# Patient Record
Sex: Male | Born: 1970 | Race: White | Hispanic: No | Marital: Married | Smoking: Former smoker
Health system: Southern US, Community
[De-identification: ages and names within clinical notes are randomized; demographics above are authoritative.]

## PROBLEM LIST (undated history)

## (undated) DIAGNOSIS — J302 Other seasonal allergic rhinitis: Secondary | ICD-10-CM

## (undated) DIAGNOSIS — K5900 Constipation, unspecified: Secondary | ICD-10-CM

## (undated) DIAGNOSIS — G8929 Other chronic pain: Secondary | ICD-10-CM

## (undated) DIAGNOSIS — R0600 Dyspnea, unspecified: Secondary | ICD-10-CM

## (undated) DIAGNOSIS — J189 Pneumonia, unspecified organism: Secondary | ICD-10-CM

## (undated) DIAGNOSIS — M549 Dorsalgia, unspecified: Secondary | ICD-10-CM

## (undated) DIAGNOSIS — T8859XA Other complications of anesthesia, initial encounter: Secondary | ICD-10-CM

## (undated) DIAGNOSIS — F909 Attention-deficit hyperactivity disorder, unspecified type: Secondary | ICD-10-CM

## (undated) HISTORY — DX: Dorsalgia, unspecified: M54.9

## (undated) HISTORY — DX: Constipation, unspecified: K59.00

## (undated) HISTORY — DX: Other seasonal allergic rhinitis: J30.2

## (undated) HISTORY — DX: Other chronic pain: G89.29

## (undated) HISTORY — PX: NECK SURGERY: SHX720

---

## 2000-02-13 ENCOUNTER — Encounter
Admission: RE | Admit: 2000-02-13 | Discharge: 2000-02-21 | Payer: Self-pay | Admitting: Physical Medicine and Rehabilitation

## 2002-10-03 ENCOUNTER — Encounter: Payer: Self-pay | Admitting: *Deleted

## 2002-10-03 ENCOUNTER — Emergency Department (HOSPITAL_COMMUNITY): Admission: EM | Admit: 2002-10-03 | Discharge: 2002-10-03 | Payer: Self-pay | Admitting: *Deleted

## 2004-11-18 ENCOUNTER — Emergency Department (HOSPITAL_COMMUNITY): Admission: EM | Admit: 2004-11-18 | Discharge: 2004-11-18 | Payer: Self-pay | Admitting: Emergency Medicine

## 2004-11-21 ENCOUNTER — Emergency Department (HOSPITAL_COMMUNITY): Admission: EM | Admit: 2004-11-21 | Discharge: 2004-11-22 | Payer: Self-pay | Admitting: Emergency Medicine

## 2005-01-22 ENCOUNTER — Emergency Department (HOSPITAL_COMMUNITY): Admission: EM | Admit: 2005-01-22 | Discharge: 2005-01-22 | Payer: Self-pay | Admitting: Emergency Medicine

## 2006-09-01 ENCOUNTER — Ambulatory Visit (HOSPITAL_COMMUNITY): Admission: RE | Admit: 2006-09-01 | Discharge: 2006-09-01 | Payer: Self-pay | Admitting: Family Medicine

## 2006-09-18 ENCOUNTER — Ambulatory Visit (HOSPITAL_COMMUNITY): Admission: RE | Admit: 2006-09-18 | Discharge: 2006-09-18 | Payer: Self-pay | Admitting: Family Medicine

## 2008-03-26 ENCOUNTER — Emergency Department (HOSPITAL_COMMUNITY): Admission: EM | Admit: 2008-03-26 | Discharge: 2008-03-26 | Payer: Self-pay | Admitting: Emergency Medicine

## 2011-09-10 ENCOUNTER — Ambulatory Visit: Payer: Self-pay | Admitting: Urgent Care

## 2011-10-01 ENCOUNTER — Encounter: Payer: Self-pay | Admitting: Urgent Care

## 2011-10-01 ENCOUNTER — Other Ambulatory Visit: Payer: Self-pay

## 2011-10-01 ENCOUNTER — Ambulatory Visit (INDEPENDENT_AMBULATORY_CARE_PROVIDER_SITE_OTHER): Payer: 59 | Admitting: Urgent Care

## 2011-10-01 VITALS — BP 130/79 | HR 83 | Temp 98.5°F | Ht 72.0 in | Wt 178.6 lb

## 2011-10-01 DIAGNOSIS — K5900 Constipation, unspecified: Secondary | ICD-10-CM

## 2011-10-01 DIAGNOSIS — Z800 Family history of malignant neoplasm of digestive organs: Secondary | ICD-10-CM

## 2011-10-01 DIAGNOSIS — R1012 Left upper quadrant pain: Secondary | ICD-10-CM | POA: Insufficient documentation

## 2011-10-01 NOTE — Progress Notes (Signed)
Primary Care Physician:  MCINNIS,ANGUS G, MD Primary Gastroenterologist:  Dr. Rourk  Chief Complaint  Patient presents with  . Constipation  . Abdominal Pain    LUQ    HPI:  [name] Potts is a 40 y.o. male here as a new patient for evaluation of chronic constipation.  He also wants to set up colonoscopy as his mother had hx colon ca dx at age 51.  He has taken OTC correctol but felt this was too rough.  Generally, he has a BM q3-4 days.  He has started fiber supplement chewable equate tabs.  They seem to help regulate his bowels.  C/o LUQ pain that was transient 1 month ago.  He currently has no abdominal pain.  The cramp-like pain was under his left rib.  It was not associated with eating, N/V/D, fever or worsened w/ movement.   He is on prn vicodin for chronic back pain.  Denies any upper GI symptoms including heartburn, indigestion, nausea, vomiting, dysphagia, odynophagia or anorexia.   Denies rectal bleeding, melena or weight loss.  Past Medical History  Diagnosis Date  . Constipation   . Chronic back pain   . Seasonal allergies     No past surgical history on file.  Current Outpatient Prescriptions  Medication Sig Dispense Refill  . fexofenadine (ALLEGRA) 180 MG tablet Take 180 mg by mouth daily.      . HYDROcodone-acetaminophen (VICODIN) 5-500 MG per tablet Take 1 tablet by mouth as needed.       . zolpidem (AMBIEN) 10 MG tablet Take 10 mg by mouth as needed.         Allergies as of 10/01/2011  . (No Known Allergies)    Family History  Problem Relation Age of Onset  . Colon cancer Mother 51    passed at age 55    History   Social History  . Marital Status: Married    Spouse Name: N/A    Number of Children: 1  . Years of Education: N/A   Occupational History  . Aircraft Tech    Social History Main Topics  . Smoking status: Former Smoker -- 1.0 packs/day    Types: Cigarettes  . Smokeless tobacco: Not on file   Comment: using a electric cigarette  . Alcohol  Use: No  . Drug Use: No  . Sexually Active: Not on file  Review of Systems: Gen: Denies any fever, chills, sweats, anorexia, fatigue, weakness, malaise, weight loss, and sleep disorder CV: Denies chest pain, angina, palpitations, syncope, orthopnea, PND, peripheral edema, and claudication. Resp: Denies dyspnea at rest, dyspnea with exercise, cough, sputum, wheezing, coughing up blood, and pleurisy. GI: Denies vomiting blood, jaundice, and fecal incontinence.  GU : Denies urinary burning, blood in urine, urinary frequency, urinary hesitancy, nocturnal urination, and urinary incontinence. MS: See HPI.  Denies muscle weakness, cramps, atrophy.  Derm: Denies rash, itching, dry skin, hives, moles, warts, or unhealing ulcers.  Psych: Denies depression, memory loss, suicidal ideation, hallucinations, paranoia, and confusion. Heme: Denies bruising, bleeding, and enlarged lymph nodes. Neuro:  Denies any headaches, dizziness, paresthesias. Endo:  Denies any problems with DM, thyroid, adrenal function.  Physical Exam: BP 130/79  Pulse 83  Temp 98.5 F (36.9 C) (Temporal)  Ht 6' (1.829 m)  Wt 178 lb 9.6 oz (81.012 kg)  BMI 24.22 kg/m2 General:   Alert,  Well-developed, well-nourished, pleasant and cooperative in NAD. Head:  Normocephalic and atraumatic. Eyes:  Sclera clear, no icterus.   Conjunctiva pink.   Ears:  Normal auditory acuity. Nose:  No deformity, discharge, or lesions. Mouth:  No deformity or lesions,oropharynx pink & moist. Neck:  Supple; no masses or thyromegaly. Lungs:  Clear throughout to auscultation.   No wheezes, crackles, or rhonchi. No acute distress. Heart:  Regular rate and rhythm; no murmurs, clicks, rubs,  or gallops. Abdomen:  Normal bowel sounds.  No bruits.  Soft, non-tender and non-distended without masses, hepatosplenomegaly or hernias noted.  No guarding or rebound tenderness.   Rectal:  Deferred. Msk:  Symmetrical without gross deformities. Normal  posture. Pulses:  Normal pulses noted. Extremities:  No clubbing or edema. Neurologic:  Alert and  oriented x4;  grossly normal neurologically. Skin:  Intact without significant lesions or rashes. Lymph Nodes:  No significant cervical adenopathy. Psych:  Alert and cooperative. Normal mood and affect.     

## 2011-10-01 NOTE — Assessment & Plan Note (Signed)
High-risk screening colonoscopy  W/ Dr Jena Gauss.  I have discussed risks & benefits which include, but are not limited to, bleeding, infection, perforation & drug reaction.  The patient agrees with this plan & written consent will be obtained.

## 2011-10-01 NOTE — Patient Instructions (Addendum)
Continue fiber supplement MiraLax 17 g daily as needed for constipation You will need colonoscopy with Dr. Jena Gauss If you develop severe pain again, please call us or go directly to the emergency department    Constipation in Adults Constipation is having fewer than 2 bowel movements per week. Usually, the stools are hard. As we grow older, constipation is more common. If you try to fix constipation with laxatives, the problem may get worse. This is because laxatives taken over a long period of time make the colon muscles weaker. A low-fiber diet, not taking in enough fluids, and taking some medicines may make these problems worse. MEDICATIONS THAT MAY CAUSE CONSTIPATION  Water pills (diuretics).   Calcium channel blockers (used to control blood pressure and for the heart).   Certain pain medicines (narcotics).   Anticholinergics.   Anti-inflammatory agents.   Antacids that contain aluminum.  DISEASES THAT CONTRIBUTE TO CONSTIPATION  Diabetes.   Parkinson's disease.   Dementia.   Stroke.   Depression.   Illnesses that cause problems with salt and water metabolism.  HOME CARE INSTRUCTIONS   Constipation is usually best cared for without medicines. Increasing dietary fiber and eating more fruits and vegetables is the best way to manage constipation.   Slowly increase fiber intake to 25 to 38 grams per day. Whole grains, fruits, vegetables, and legumes are good sources of fiber. A dietitian can further help you incorporate high-fiber foods into your diet.   Drink enough water and fluids to keep your urine clear or pale yellow.   A fiber supplement may be added to your diet if you cannot get enough fiber from foods.   Increasing your activities also helps improve regularity.   Suppositories, as suggested by your caregiver, will also help. If you are using antacids, such as aluminum or calcium containing products, it will be helpful to switch to products containing magnesium  if your caregiver says it is okay.   If you have been given a liquid injection (enema) today, this is only a temporary measure. It should not be relied on for treatment of longstanding (chronic) constipation.   Stronger measures, such as magnesium sulfate, should be avoided if possible. This may cause uncontrollable diarrhea. Using magnesium sulfate may not allow you time to make it to the bathroom.  SEEK IMMEDIATE MEDICAL CARE IF:   There is bright red blood in the stool.   The constipation stays for more than 4 days.   There is belly (abdominal) or rectal pain.   You do not seem to be getting better.   You have any questions or concerns.  MAKE SURE YOU:   Understand these instructions.   Will watch your condition.   Will get help right away if you are not doing well or get worse.  Document Released: 12/29/2003 Document Revised: 03/21/2011 Document Reviewed: 03/05/2011 [facility] Patient Information 2012 [place], [place].

## 2011-10-01 NOTE — Assessment & Plan Note (Signed)
Transient LUQ pain 1 month ago not associated with GERD, fever, N/V, movement, wt loss, eating or change in bowel habits.  Pt unsure if relieved after defecation.  Trial of miralax for constipation.  If pain returns, he is to let us know & we can pursue further work-up at that time.

## 2011-10-01 NOTE — Assessment & Plan Note (Signed)
[name] Potts is a pleasant 41 y.o. male  W/ chronic constipation that has responded to fiber chewables.  Continue equate fiber supplement Constipation literature Miralax 17 grams daily as needed

## 2011-10-02 MED ORDER — SODIUM CHLORIDE 0.45 % IV SOLN
Freq: Once | INTRAVENOUS | Status: AC
  Administered 2011-10-11: 1000 mL via INTRAVENOUS

## 2011-10-03 NOTE — Progress Notes (Signed)
Faxed to PCP

## 2011-10-07 ENCOUNTER — Encounter (HOSPITAL_COMMUNITY): Payer: Self-pay | Admitting: Pharmacy Technician

## 2011-10-11 ENCOUNTER — Encounter (HOSPITAL_COMMUNITY)
Admission: RE | Disposition: A | Discharge status: Home / Self Care | Payer: Self-pay | Source: Ambulatory Visit | Attending: Internal Medicine

## 2011-10-11 ENCOUNTER — Ambulatory Visit (HOSPITAL_COMMUNITY)
Admission: RE | Admit: 2011-10-11 | Discharge: 2011-10-11 | Disposition: A | Discharge status: Home / Self Care | Payer: 59 | Source: Ambulatory Visit | Attending: Internal Medicine | Admitting: Internal Medicine

## 2011-10-11 ENCOUNTER — Encounter (HOSPITAL_COMMUNITY): Payer: Self-pay | Admitting: *Deleted

## 2011-10-11 DIAGNOSIS — K5900 Constipation, unspecified: Secondary | ICD-10-CM

## 2011-10-11 DIAGNOSIS — Z1211 Encounter for screening for malignant neoplasm of colon: Secondary | ICD-10-CM | POA: Insufficient documentation

## 2011-10-11 DIAGNOSIS — Z800 Family history of malignant neoplasm of digestive organs: Secondary | ICD-10-CM | POA: Insufficient documentation

## 2011-10-11 HISTORY — PX: COLONOSCOPY: SHX5424

## 2011-10-11 SURGERY — COLONOSCOPY
Anesthesia: Moderate Sedation

## 2011-10-11 MED ORDER — MIDAZOLAM HCL 5 MG/5ML IJ SOLN
INTRAMUSCULAR | Status: AC
  Filled 2011-10-11: qty 10

## 2011-10-11 MED ORDER — MEPERIDINE HCL 100 MG/ML IJ SOLN
INTRAMUSCULAR | Status: AC
  Filled 2011-10-11: qty 2

## 2011-10-11 MED ORDER — MIDAZOLAM HCL 5 MG/5ML IJ SOLN
INTRAMUSCULAR | Status: DC | PRN
  Administered 2011-10-11: 2 mg via INTRAVENOUS
  Administered 2011-10-11: 1 mg via INTRAVENOUS
  Administered 2011-10-11: 2 mg via INTRAVENOUS

## 2011-10-11 MED ORDER — STERILE WATER FOR IRRIGATION IR SOLN
Status: DC | PRN
  Administered 2011-10-11: 14:00:00

## 2011-10-11 MED ORDER — MEPERIDINE HCL 100 MG/ML IJ SOLN
INTRAMUSCULAR | Status: DC | PRN
  Administered 2011-10-11 (×2): 25 mg via INTRAVENOUS
  Administered 2011-10-11: 50 mg via INTRAVENOUS

## 2011-10-11 NOTE — Interval H&P Note (Signed)
History and Physical Interval Note:  10/11/2011 1:41 PM  [name] Potts  has presented today for surgery, with the diagnosis of Constipation/ family hx of colon cancer  The various methods of treatment have been discussed with the patient and family. After consideration of risks, benefits and other options for treatment, the patient has consented to  Procedure(s) (LRB): COLONOSCOPY (N/A) as a surgical intervention .  The patient's history has been reviewed, patient examined, no change in status, stable for surgery.  I have reviewed the patients' chart and labs.  Questions were answered to the patient's satisfaction.     Eula Listen

## 2011-10-11 NOTE — Discharge Instructions (Addendum)
  Colonoscopy °Discharge Instructions ° °Read the instructions outlined below and refer to this sheet in the next few weeks. These discharge instructions provide you with general information on caring for yourself after you leave the hospital. Your doctor may also give you specific instructions. While your treatment has been planned according to the most current medical practices available, unavoidable complications occasionally occur. If you have any problems or questions after discharge, call Dr. Rourk at [phone]. °ACTIVITY °· You may resume your regular activity, but move at a slower pace for the next 24 hours.  °· Take frequent rest periods for the next 24 hours.  °· Walking will help get rid of the air and reduce the bloated feeling in your belly (abdomen).  °· No driving for 24 hours (because of the medicine (anesthesia) used during the test).   °· Do not sign any important legal documents or operate any machinery for 24 hours (because of the anesthesia used during the test).  °NUTRITION °· Drink plenty of fluids.  °· You may resume your normal diet as instructed by your doctor.  °· Begin with a light meal and progress to your normal diet. Heavy or fried foods are harder to digest and may make you feel sick to your stomach (nauseated).  °· Avoid alcoholic beverages for 24 hours or as instructed.  °MEDICATIONS °· You may resume your normal medications unless your doctor tells you otherwise.  °WHAT YOU CAN EXPECT TODAY °· Some feelings of bloating in the abdomen.  °· Passage of more gas than usual.  °· Spotting of blood in your stool or on the toilet paper.  °IF YOU HAD POLYPS REMOVED DURING THE COLONOSCOPY: °· No aspirin products for 7 days or as instructed.  °· No alcohol for 7 days or as instructed.  °· Eat a soft diet for the next 24 hours.  °FINDING OUT THE RESULTS OF YOUR TEST °Not all test results are available during your visit. If your test results are not back during the visit, make an appointment  with your caregiver to find out the results. Do not assume everything is normal if you have not heard from your caregiver or the medical facility. It is important for you to follow up on all of your test results.  °SEEK IMMEDIATE MEDICAL ATTENTION IF: °· You have more than a spotting of blood in your stool.  °· Your belly is swollen (abdominal distention).  °· You are nauseated or vomiting.  °· You have a temperature over 101.  °· You have abdominal pain or discomfort that is severe or gets worse throughout the day.  ° ° °Repeat high risk screening colonoscopy in 5 years °

## 2011-10-11 NOTE — H&P (View-Only) (Signed)
Primary Care Physician:  Alice Reichert, MD Primary Gastroenterologist:  Dr. Jena Gauss  Chief Complaint  Patient presents with  . Constipation  . Abdominal Pain    LUQ    HPI:  [name] Potts is a 41 y.o. male here as a new patient for evaluation of chronic constipation.  He also wants to set up colonoscopy as his mother had hx colon ca dx at age 33.  He has taken OTC correctol but felt this was too rough.  Generally, he has a BM q3-4 days.  He has started fiber supplement chewable equate tabs.  They seem to help regulate his bowels.  C/o LUQ pain that was transient 1 month ago.  He currently has no abdominal pain.  The cramp-like pain was under his left rib.  It was not associated with eating, N/V/D, fever or worsened w/ movement.   He is on prn vicodin for chronic back pain.  Denies any upper GI symptoms including heartburn, indigestion, nausea, vomiting, dysphagia, odynophagia or anorexia.   Denies rectal bleeding, melena or weight loss.  Past Medical History  Diagnosis Date  . Constipation   . Chronic back pain   . Seasonal allergies     No past surgical history on file.  Current Outpatient Prescriptions  Medication Sig Dispense Refill  . fexofenadine (ALLEGRA) 180 MG tablet Take 180 mg by mouth daily.      Marland Kitchen HYDROcodone-acetaminophen (VICODIN) 5-500 MG per tablet Take 1 tablet by mouth as needed.       . zolpidem (AMBIEN) 10 MG tablet Take 10 mg by mouth as needed.         Allergies as of 10/01/2011  . (No Known Allergies)    Family History  Problem Relation Age of Onset  . Colon cancer Mother 25    passed at age 58    History   Social History  . Marital Status: Married    Spouse Name: N/A    Number of Children: 1  . Years of Education: N/A   Occupational History  . Barrister's clerk    Social History Main Topics  . Smoking status: Former Smoker -- 1.0 packs/day    Types: Cigarettes  . Smokeless tobacco: Not on file   Comment: using a electric cigarette  . Alcohol  Use: No  . Drug Use: No  . Sexually Active: Not on file  Review of Systems: Gen: Denies any fever, chills, sweats, anorexia, fatigue, weakness, malaise, weight loss, and sleep disorder CV: Denies chest pain, angina, palpitations, syncope, orthopnea, PND, peripheral edema, and claudication. Resp: Denies dyspnea at rest, dyspnea with exercise, cough, sputum, wheezing, coughing up blood, and pleurisy. GI: Denies vomiting blood, jaundice, and fecal incontinence.  GU : Denies urinary burning, blood in urine, urinary frequency, urinary hesitancy, nocturnal urination, and urinary incontinence. MS: See HPI.  Denies muscle weakness, cramps, atrophy.  Derm: Denies rash, itching, dry skin, hives, moles, warts, or unhealing ulcers.  Psych: Denies depression, memory loss, suicidal ideation, hallucinations, paranoia, and confusion. Heme: Denies bruising, bleeding, and enlarged lymph nodes. Neuro:  Denies any headaches, dizziness, paresthesias. Endo:  Denies any problems with DM, thyroid, adrenal function.  Physical Exam: BP 130/79  Pulse 83  Temp 98.5 F (36.9 C) (Temporal)  Ht 6' (1.829 m)  Wt 178 lb 9.6 oz (81.012 kg)  BMI 24.22 kg/m2 General:   Alert,  Well-developed, well-nourished, pleasant and cooperative in NAD. Head:  Normocephalic and atraumatic. Eyes:  Sclera clear, no icterus.   Conjunctiva pink.  Ears:  Normal auditory acuity. Nose:  No deformity, discharge, or lesions. Mouth:  No deformity or lesions,oropharynx pink & moist. Neck:  Supple; no masses or thyromegaly. Lungs:  Clear throughout to auscultation.   No wheezes, crackles, or rhonchi. No acute distress. Heart:  Regular rate and rhythm; no murmurs, clicks, rubs,  or gallops. Abdomen:  Normal bowel sounds.  No bruits.  Soft, non-tender and non-distended without masses, hepatosplenomegaly or hernias noted.  No guarding or rebound tenderness.   Rectal:  Deferred. Msk:  Symmetrical without gross deformities. Normal  posture. Pulses:  Normal pulses noted. Extremities:  No clubbing or edema. Neurologic:  Alert and  oriented x4;  grossly normal neurologically. Skin:  Intact without significant lesions or rashes. Lymph Nodes:  No significant cervical adenopathy. Psych:  Alert and cooperative. Normal mood and affect.

## 2011-10-11 NOTE — Op Note (Signed)
[facility] [address] [place], [place]  [address]  COLONOSCOPY PROCEDURE REPORT  PATIENT:  [name], Potts  MR#:  [id number] BIRTHDATE:  [birth year], 40 yrs. old  GENDER:  male ENDOSCOPIST:  R. Roetta Sessions, MD FACP [facility] REF. BY:  Butch Penny, M.D. PROCEDURE DATE:  10/11/2011 PROCEDURE:  High-risk screening colonoscopy  INDICATIONS:  High-risk colorectal cancer screening (mother succumbed to colorectal cancer at age 47)  INFORMED CONSENT:  The risks, benefits, alternatives and imponderables including but not limited to bleeding, perforation as well as the possibility of a missed lesion have been reviewed. The potential for biopsy, lesion removal, etc. have also been discussed.  Questions have been answered.  All parties agreeable. Please see the history and physical in the medical record for more information.  MEDICATIONS:  Versed 5 mg IV and Demerol 100 mg IV in divided doses.  DESCRIPTION OF PROCEDURE:  After a digital rectal exam was performed, the EC-[id number] ([id number]) colonoscope was advanced from the anus through the rectum and colon to the area of the cecum, ileocecal valve and appendiceal orifice.  The cecum was deeply intubated.  These structures were well-seen and photographed for the record.  From the level of the cecum and ileocecal valve, the scope was slowly and cautiously withdrawn.  The mucosal surfaces were carefully surveyed utilizing scope tip deflection to facilitate fold flattening as needed.  The scope was pulled down into the rectum where a thorough examination including retroflexion was performed. <<PROCEDUREIMAGES>>  FINDINGS: Adequate preparation. Anal papilla; otherwise normal rectum; normal-appearing colonic mucosa  THERAPEUTIC / DIAGNOSTIC MANEUVERS PERFORMED: None  COMPLICATIONS:  None  CECAL WITHDRAWAL TIME: 8 minutes  IMPRESSION:  Anal papilla; normal rectum and colon  RECOMMENDATIONS:  Repeat high-risk screening  colonoscopy in 5 years  ______________________________ R. Roetta Sessions, MD [name]  CC:  Butch Penny, M.D.  n. eSIGNED:   R. Roetta Sessions at 10/11/2011 02:12 PM  Lattie Corns, [phone]

## 2011-10-15 ENCOUNTER — Encounter (HOSPITAL_COMMUNITY): Payer: Self-pay | Admitting: Internal Medicine

## 2016-06-24 ENCOUNTER — Encounter (HOSPITAL_COMMUNITY): Payer: Self-pay | Admitting: *Deleted

## 2016-06-24 ENCOUNTER — Emergency Department (HOSPITAL_COMMUNITY)
Admission: EM | Admit: 2016-06-24 | Discharge: 2016-06-25 | Disposition: A | Discharge status: Home / Self Care | Payer: Commercial Managed Care - PPO | Attending: Emergency Medicine | Admitting: Emergency Medicine

## 2016-06-24 DIAGNOSIS — R109 Unspecified abdominal pain: Secondary | ICD-10-CM | POA: Insufficient documentation

## 2016-06-24 DIAGNOSIS — Z79899 Other long term (current) drug therapy: Secondary | ICD-10-CM | POA: Insufficient documentation

## 2016-06-24 DIAGNOSIS — F17210 Nicotine dependence, cigarettes, uncomplicated: Secondary | ICD-10-CM | POA: Insufficient documentation

## 2016-06-24 DIAGNOSIS — M62830 Muscle spasm of back: Secondary | ICD-10-CM | POA: Insufficient documentation

## 2016-06-24 MED ORDER — CYCLOBENZAPRINE HCL 10 MG PO TABS
10.0000 mg | ORAL_TABLET | Freq: Once | ORAL | Status: AC
  Administered 2016-06-24: 10 mg via ORAL
  Filled 2016-06-24: qty 1

## 2016-06-24 MED ORDER — KETOROLAC TROMETHAMINE 60 MG/2ML IM SOLN
60.0000 mg | Freq: Once | INTRAMUSCULAR | Status: AC
  Administered 2016-06-24: 60 mg via INTRAMUSCULAR
  Filled 2016-06-24: qty 2

## 2016-06-24 NOTE — ED Provider Notes (Signed)
[facility] DEPT Provider Note   CSN: [id number] Arrival date & time: 06/24/16  2302  By signing my name below, I, Dolores Hoose, attest that this documentation has been prepared under the direction and in the presence of Rolland Porter, MD . Electronically Signed: Dolores Hoose, Scribe. 06/24/2016. 11:19 PM.  Time seen 23:24 PM  History   Chief Complaint Chief Complaint  Patient presents with  . Flank Pain   The history is provided by the patient. No language interpreter was used.    HPI Comments:  [name] Potts is a 46 y.o. male who presents to the Emergency Department complaining of sudden-onset, constant, moderate right flank pain onset about 5 hours ago at 6 pm. He describes his pain as a sensation as if a knife is in his back, which radiates superiorly to his mid-right back, and laterally to his right side.It does not radiate into his abdomen. Pt notes that he has experienced some soreness in the area for the past week, but his pain exacerbated drastically today after dinner. He reports he was up in the attic this afternoon finishing putting away Christmas decorations and also looking at the wiring of the house. He states it was awkward getting them up into the attic. He states he was doing a lot of twisting and turning. His pain is exacerbated by deep inhalation and movement, and alleviated by sitting still. Pt denies any nausea, vomiting or bloody urine. He is an occasional smoker (1pack/3days), does not drink, and currently works an Electrical engineer. He reports about 18 or 20 years ago he had some lower back problems however he has not had any problems since.  PCP Luster Landsberg Ailene Ravel, MD (Inactive)   Past Medical History:  Diagnosis Date  . Chronic back pain   . Constipation   . Seasonal allergies     Patient Active Problem List   Diagnosis Date Noted  . Constipation 10/01/2011  . LUQ pain 10/01/2011  . Family history of colon cancer 10/01/2011    Past Surgical History:    Procedure Laterality Date  . COLONOSCOPY  10/11/2011   Procedure: COLONOSCOPY;  Surgeon: Daneil Dolin, MD;  Location: [facility];  Service: Endoscopy;  Laterality: N/A;  1:45 PM       Home Medications    Prior to Admission medications   Medication Sig Start Date End Date Taking? Authorizing Provider  cyclobenzaprine (FLEXERIL) 5 MG tablet Take 1 tablet (5 mg total) by mouth 3 (three) times daily as needed. 06/25/16   Rolland Porter, MD  fexofenadine (ALLEGRA) 180 MG tablet Take 180 mg by mouth every morning.     Historical Provider, MD  HYDROcodone-acetaminophen (VICODIN) 5-500 MG per tablet Take 1 tablet by mouth every 6 (six) hours as needed. For pain 09/24/11   Historical Provider, MD  naproxen (NAPROSYN) 250 MG tablet Take 2 po BID with food prn pain 06/25/16   Rolland Porter, MD  zolpidem (AMBIEN) 10 MG tablet Take 5 mg by mouth as needed. For sleep 09/30/11   Historical Provider, MD    Family History Family History  Problem Relation Age of Onset  . Colon cancer Mother 55    passed at age 19    Social History Social History  Substance Use Topics  . Smoking status: Current Some Day Smoker    Packs/day: 0.50  . Smokeless tobacco: Never Used     Comment: using a electric cigarette  . Alcohol use No  employed  Lives with spouse Smokes  1/3 ppd   Allergies   Patient has no known allergies.   Review of Systems Review of Systems  Gastrointestinal: Negative for nausea and vomiting.  Genitourinary: Positive for flank pain. Negative for hematuria.  Musculoskeletal: Positive for back pain.  All other systems reviewed and are negative.    Physical Exam Updated Vital Signs BP 110/82 (BP Location: Left Arm)   Pulse 89   Temp 97.6 F (36.4 C) (Oral)   Resp 16   Ht 6' (1.829 m)   Wt 200 lb (90.7 kg)   SpO2 96%   BMI 27.12 kg/m   Vital signs normal    Physical Exam  Constitutional: He is oriented to person, place, and time. He appears well-developed and well-nourished.   Non-toxic appearance. He does not appear ill. He appears distressed.  Sitting straight trying not to move  HENT:  Head: Normocephalic and atraumatic.  Right Ear: External ear normal.  Left Ear: External ear normal.  Nose: Nose normal. No mucosal edema or rhinorrhea.  Mouth/Throat: Oropharynx is clear and moist and mucous membranes are normal. No dental abscesses or uvula swelling.  Eyes: Conjunctivae and EOM are normal. Pupils are equal, round, and reactive to light.  Neck: Normal range of motion and full passive range of motion without pain. Neck supple.  Cardiovascular: Normal rate, regular rhythm and normal heart sounds.  Exam reveals no gallop and no friction rub.   No murmur heard. Pulmonary/Chest: Effort normal and breath sounds normal. No respiratory distress. He has no wheezes. He has no rhonchi. He has no rales. He exhibits no tenderness and no crepitus.  Abdominal: Soft. Normal appearance and bowel sounds are normal. He exhibits no distension. There is no tenderness. There is no rebound and no guarding.  Musculoskeletal: Normal range of motion. He exhibits no edema or tenderness.       Back:  Nontender to palpation in lumbar spine. Tender over right paraspinal muscles, which reproduces his complaints of pain. Very limited lumbar ROM due to pain in all directions.   Neurological: He is alert and oriented to person, place, and time. He has normal strength. No cranial nerve deficit.  Skin: Skin is warm, dry and intact. No rash noted. No erythema. No pallor.  Psychiatric: He has a normal mood and affect. His speech is normal and behavior is normal. His mood appears not anxious.  Nursing note and vitals reviewed.    ED Treatments / Results  Labs (all labs ordered are listed, but only abnormal results are displayed) Results for orders placed or performed during the hospital encounter of 06/24/16  Urinalysis, Routine w reflex microscopic  Result Value Ref Range   Color, Urine  YELLOW YELLOW   APPearance CLEAR CLEAR   Specific Gravity, Urine 1.011 1.005 - 1.030   pH 6.0 5.0 - 8.0   Glucose, UA NEGATIVE NEGATIVE mg/dL   Hgb urine dipstick SMALL (A) NEGATIVE   Bilirubin Urine NEGATIVE NEGATIVE   Ketones, ur NEGATIVE NEGATIVE mg/dL   Protein, ur NEGATIVE NEGATIVE mg/dL   Nitrite NEGATIVE NEGATIVE   Leukocytes, UA NEGATIVE NEGATIVE   RBC / HPF 0-5 0 - 5 RBC/hpf   WBC, UA 0-5 0 - 5 WBC/hpf   Bacteria, UA NONE SEEN NONE SEEN   Laboratory interpretation all normal except trace blood otherwise normal    EKG  EKG Interpretation None       Radiology Ct Renal Stone Study  Result Date: 06/25/2016 CLINICAL DATA:  46 year old male with right flank pain. EXAM:  CT ABDOMEN AND PELVIS WITHOUT CONTRAST TECHNIQUE: Multidetector CT imaging of the abdomen and pelvis was performed following the standard protocol without IV contrast. COMPARISON:  None. FINDINGS: Evaluation of this exam is limited in the absence of intravenous contrast. Lower chest: There bibasilar linear atelectasis/ scarring. No intra-abdominal free air or free fluid. Hepatobiliary: Subcentimeter hypodense lesion in the dome of the right lobe of the liver is too small to characterize but likely represents a cyst or hemangioma. The liver is otherwise unremarkable. The gallbladder is partially distended and appears unremarkable. Pancreas: Unremarkable. No pancreatic ductal dilatation or surrounding inflammatory changes. Spleen: Normal in size without focal abnormality. Adrenals/Urinary Tract: The adrenal glands are unremarkable. There is a 3 cm low attenuating exophytic lesion arising from the inferior pole of the right kidney which demonstrates fluid attenuation. This most likely represents a cyst. Ultrasound may provide better evaluation. There is no hydronephrosis or nephrolithiasis. The visualized ureter and urinary bladder appear unremarkable. Stomach/Bowel: There is no evidence of bowel obstruction or active  inflammation. Normal appendix. Vascular/Lymphatic: The abdominal aorta and IVC are grossly unremarkable on this noncontrast study. No portal venous gas identified. There is no adenopathy. Reproductive: The prostate and seminal vesicles are grossly unremarkable. Other: Small fat containing bilateral inguinal hernias. There is diastases of anterior abdominal wall musculature with a small fat containing umbilical hernia. Musculoskeletal: Mild degenerative changes of the spine most prominent at L5-S1 with disc desiccation with vacuum phenomena. No acute fracture. IMPRESSION: No acute intra-abdominal or pelvic pathology. No hydronephrosis or nephrolithiasis. Right renal inferior pole fluid attenuating exophytic lesion, incompletely characterized, likely a cyst. Ultrasound may provide better characterization of this lesion. Electronically Signed   By: Anner Crete M.D.   On: 06/25/2016 01:51    Procedures Procedures (including critical care time)  Medications Ordered in ED Medications  ketorolac (TORADOL) injection 60 mg (60 mg Intramuscular Given 06/24/16 2342)  cyclobenzaprine (FLEXERIL) tablet 10 mg (10 mg Oral Given 06/24/16 2342)     Initial Impression / Assessment and Plan / ED Course  I have reviewed the triage vital signs and the nursing notes.  Pertinent labs & imaging results that were available during my care of the patient were reviewed by me and considered in my medical decision making (see chart for details).    COORDINATION OF CARE:  11:31 PM Discussed treatment plan with pt at bedside which includes urinalysis to rule out kidney stones and pt agreed to plan.Patient was given Toradol IM and oral Flexeril.  When rechecked he states he felt sleepy ovary still had some discomfort. However he was now looking more comfortable than before. We discussed doing a CT of his abdomen to look for renal stones and he is agreeable.  Patient was given the results of his CT scan. He was advised  about follow-up for this possible cyst on his kidney. He was discharged home with nonsteroidal anti-inflammatory muscle relaxer. He was advised to use ice and heat for comfort. He should be rechecked if he seems worse such as fever or vomiting. We also discussed he has the inginual hernias and umbilical hernia, but he was reassured that they are fat containing.     Final Clinical Impressions(s) / ED Diagnoses   Final diagnoses:  Acute right flank pain  Muscle spasm of back    New Prescriptions Discharge Medication List as of 06/25/2016  1:58 AM    START taking these medications   Details  cyclobenzaprine (FLEXERIL) 5 MG tablet Take 1 tablet (5 mg  total) by mouth 3 (three) times daily as needed., Starting Tue 06/25/2016, Print    naproxen (NAPROSYN) 250 MG tablet Take 2 po BID with food prn pain, Print       Plan discharge  Rolland Porter, MD, FACEP  I personally performed the services described in this documentation, which was scribed in my presence. The recorded information has been reviewed and considered.  Rolland Porter, MD, Barbette Or, MD 06/25/16 [phone]

## 2016-06-24 NOTE — ED Triage Notes (Signed)
Pt c/o right sided flank pain that started earlier this evening after eating supper; pt denies any n/v or urinary problems

## 2016-06-25 ENCOUNTER — Emergency Department (HOSPITAL_COMMUNITY): Payer: Commercial Managed Care - PPO

## 2016-06-25 LAB — URINALYSIS, ROUTINE W REFLEX MICROSCOPIC
BILIRUBIN URINE: NEGATIVE
Bacteria, UA: NONE SEEN
Glucose, UA: NEGATIVE mg/dL
Ketones, ur: NEGATIVE mg/dL
Leukocytes, UA: NEGATIVE
Nitrite: NEGATIVE
PH: 6 (ref 5.0–8.0)
Protein, ur: NEGATIVE mg/dL
SPECIFIC GRAVITY, URINE: 1.011 (ref 1.005–1.030)

## 2016-06-25 MED ORDER — NAPROXEN 250 MG PO TABS
ORAL_TABLET | ORAL | 0 refills | Status: DC
Start: 2016-06-25 — End: 2017-12-02

## 2016-06-25 MED ORDER — CYCLOBENZAPRINE HCL 5 MG PO TABS: 5.0000 mg | ORAL_TABLET | Freq: Three times a day (TID) | ORAL | 0 refills | Status: DC | PRN

## 2016-06-25 NOTE — ED Notes (Signed)
ED Provider at bedside. 

## 2016-06-25 NOTE — ED Notes (Signed)
Patient transported to CT 

## 2016-06-25 NOTE — Discharge Instructions (Signed)
Use ice and heat for comfort. Take the medications as prescribed. Recheck if you get a fever, vomiting or pain not improving over the next week.  You have an area on your right kidney that appears to be a cyst, but the radiologist recommends you get further testing. You can see your primary care doctor for that.

## 2016-08-19 ENCOUNTER — Encounter: Payer: Self-pay | Admitting: Internal Medicine

## 2017-12-02 ENCOUNTER — Ambulatory Visit (INDEPENDENT_AMBULATORY_CARE_PROVIDER_SITE_OTHER): Payer: Self-pay

## 2017-12-02 DIAGNOSIS — Z1211 Encounter for screening for malignant neoplasm of colon: Secondary | ICD-10-CM

## 2017-12-02 MED ORDER — NA SULFATE-K SULFATE-MG SULF 17.5-3.13-1.6 GM/177ML PO SOLN: 1.0000 | ORAL | 0 refills | Status: DC

## 2017-12-02 NOTE — Patient Instructions (Signed)
[name] Potts  18-Jun-1970 MRN: [id number]     Procedure Date: 01/23/18 Time to register: 12:00pm Place to register: [facility] Procedure Time: 1:00pm Scheduled provider: R. Garfield Cornea, MD    PREPARATION FOR COLONOSCOPY WITH SUPREP BOWEL PREP KIT  Note: Suprep Bowel Prep Kit is a split-dose (2day) regimen. Consumption of BOTH 6-ounce bottles is required for a complete prep.  Please notify us immediately if you are diabetic, take iron supplements, or if you are on Coumadin or any other blood thinners.                                                                                                                                                    2 DAYS BEFORE PROCEDURE:  DATE: 01/21/18   DAY: Wednesday Begin clear liquid diet AFTER your lunch meal. NO SOLID FOODS after this point.  1 DAY BEFORE PROCEDURE:  DATE: 01/22/18   DAY: Thursday Continue clear liquids the entire day - NO SOLID FOOD.     At 6:00pm: Complete steps 1 through 4 below, using ONE (1) 6-ounce bottle, before going to bed. Step 1:  Pour ONE (1) 6-ounce bottle of SUPREP liquid into the mixing container.  Step 2:  Add cool drinking water to the 16 ounce line on the container and mix.  Note: Dilute the solution concentrate as directed prior to use. Step 3:  DRINK ALL the liquid in the container. Step 4:  You MUST drink an additional two (2) or more 16 ounce containers of water over the next one (1) hour.   Continue clear liquids.  DAY OF PROCEDURE:   DATE: 01/23/18   DAY: Friday If you take medications for your heart, blood pressure, or breathing, you may take these medications.    5 hours before your procedure at: 8:00am Step 1:  Pour ONE (1) 6-ounce bottle of SUPREP liquid into the mixing container.  Step 2:  Add cool drinking water to the 16 ounce line on the container and mix.  Note: Dilute the solution concentrate as directed prior to use. Step 3:  DRINK ALL the liquid in the container. Step  4:  You MUST drink an additional two (2) or more 16 ounce containers of water over the next one (1) hour. You MUST complete the final glass of water at least 3 hours before your colonoscopy.   Nothing by mouth past: 10:00am  You may take your morning medications with sip of water unless we have instructed otherwise.    Please see below for Dietary Information.  CLEAR LIQUIDS INCLUDE:  Water Jello (NOT red in color)   Ice Popsicles (NOT red in color)   Tea (sugar ok, no milk/cream) Powdered fruit flavored drinks  Coffee (sugar ok, no milk/cream) Gatorade/ Lemonade/ Kool-Aid  (NOT red in color)   Juice: apple, white grape,  white cranberry Soft drinks  Clear bullion, consomme, broth (fat free beef/chicken/vegetable)  Carbonated beverages (any kind)  Strained chicken noodle soup Hard Candy   Remember: Clear liquids are liquids that will allow you to see your fingers on the other side of a clear glass. Be sure liquids are NOT red in color, and not cloudy, but CLEAR.  DO NOT EAT OR DRINK ANY OF THE FOLLOWING:  Dairy products of any kind   Cranberry juice Tomato juice / V8 juice   Grapefruit juice Orange juice     Red grape juice  Do not eat any solid foods, including such foods as: cereal, oatmeal, yogurt, fruits, vegetables, creamed soups, eggs, bread, crackers, pureed foods in a blender, etc.   HELPFUL HINTS FOR DRINKING PREP SOLUTION:   Make sure prep is extremely cold. Mix and refrigerate the the morning of the prep. You may also put in the freezer.   You may try mixing some Crystal Light or Country Time Lemonade if you prefer. Mix in small amounts; add more if necessary.  Try drinking through a straw  Rinse mouth with water or a mouthwash between glasses, to remove after-taste.  Try sipping on a cold beverage /ice/ popsicles between glasses of prep.  Place a piece of sugar-free hard candy in mouth between glasses.  If you become nauseated, try consuming smaller amounts, or  stretch out the time between glasses. Stop for 30-60 minutes, then slowly start back drinking.     OTHER INSTRUCTIONS  You will need a responsible adult at least 49 years of age to accompany you and drive you home. This person must remain in the waiting room during your procedure. The hospital will cancel your procedure if you do not have a responsible adult with you.   1. Wear loose fitting clothing that is easily removed. 2. Leave jewelry and other valuables at home.  3. Remove all body piercing jewelry and leave at home. 4. Total time from sign-in until discharge is approximately 2-3 hours. 5. You should go home directly after your procedure and rest. You can resume normal activities the day after your procedure. 6. The day of your procedure you should not:  Drive  Make legal decisions  Operate machinery  Drink alcohol  Return to work   You may call the office (Dept: [phone]) before 5:00pm, or page the doctor on call [phone]) after 5:00pm, for further instructions, if necessary.   Insurance Information YOU WILL NEED TO CHECK WITH YOUR INSURANCE COMPANY FOR THE BENEFITS OF COVERAGE YOU HAVE FOR THIS PROCEDURE.  UNFORTUNATELY, NOT ALL INSURANCE COMPANIES HAVE BENEFITS TO COVER ALL OR PART OF THESE TYPES OF PROCEDURES.  IT IS YOUR RESPONSIBILITY TO CHECK YOUR BENEFITS, HOWEVER, WE WILL BE GLAD TO ASSIST YOU WITH ANY CODES YOUR INSURANCE COMPANY MAY NEED.    PLEASE NOTE THAT MOST INSURANCE COMPANIES WILL NOT COVER A SCREENING COLONOSCOPY FOR PEOPLE UNDER THE AGE OF 50  IF YOU HAVE BCBS INSURANCE, YOU MAY HAVE BENEFITS FOR A SCREENING COLONOSCOPY BUT IF POLYPS ARE FOUND THE DIAGNOSIS WILL CHANGE AND THEN YOU MAY HAVE A DEDUCTIBLE THAT WILL NEED TO BE MET. SO PLEASE MAKE SURE YOU CHECK YOUR BENEFITS FOR A SCREENING COLONOSCOPY AS WELL AS A DIAGNOSTIC COLONOSCOPY.

## 2017-12-02 NOTE — Progress Notes (Addendum)
Gastroenterology Pre-Procedure Review  Request Date:12/02/17 Requesting Physician: Dr.Sun Sadie Haber [facility]- [facility] ( last tcs 10/11/11- normal, +[facility])  PATIENT REVIEW QUESTIONS: The patient responded to the following health history questions as indicated:    1. Diabetes Melitis: no 2. Joint replacements in the past 12 months: no 3. Major health problems in the past 3 months: no 4. Has an artificial valve or MVP: no 5. Has a defibrillator: no 6. Has been advised in past to take antibiotics in advance of a procedure like teeth cleaning: no 7. Family history of colon cancer: yes (mom- age 33, she died at age 60) 64. Alcohol Use: no 9. History of sleep apnea: no  10. History of coronary artery or other vascular stents placed within the last 12 months: no 11. History of any prior anesthesia complications: no    MEDICATIONS & ALLERGIES:    Patient reports the following regarding taking any blood thinners:   Plavix? no Aspirin? no Coumadin? no Brilinta? no Xarelto? no Eliquis? no Pradaxa? no Savaysa? no Effient? no  Patient confirms/reports the following medications:  Current Outpatient Medications  Medication Sig Dispense Refill  . fexofenadine (ALLEGRA) 180 MG tablet Take 180 mg by mouth every morning.      No current facility-administered medications for this visit.     Patient confirms/reports the following allergies:  No Known Allergies  No orders of the defined types were placed in this encounter.   AUTHORIZATION INFORMATION Primary Insurance: [place],  [place] #: [id number] Pre-Cert / Josem Kaufmann required: yes Pre-Cert / Auth #: [id number]  Gwenette Greet) Dates: 01/21/18-03/14/18   SCHEDULE INFORMATION: Procedure has been scheduled as follows:  Date: 01/23/18, Time: 1:00 Location: [facility] Dr.Rourk  This Gastroenterology Pre-Precedure Review Form is being routed to the following provider(s): Walden Field NP

## 2017-12-04 NOTE — Progress Notes (Signed)
Ok to schedule.

## 2018-01-23 ENCOUNTER — Encounter (HOSPITAL_COMMUNITY): Payer: Self-pay | Admitting: *Deleted

## 2018-01-23 ENCOUNTER — Encounter (HOSPITAL_COMMUNITY)
Admission: RE | Disposition: A | Discharge status: Home / Self Care | Payer: Self-pay | Source: Ambulatory Visit | Attending: Internal Medicine

## 2018-01-23 ENCOUNTER — Other Ambulatory Visit: Payer: Self-pay

## 2018-01-23 ENCOUNTER — Ambulatory Visit (HOSPITAL_COMMUNITY)
Admission: RE | Admit: 2018-01-23 | Discharge: 2018-01-23 | Disposition: A | Discharge status: Home / Self Care | Payer: Commercial Managed Care - PPO | Source: Ambulatory Visit | Attending: Internal Medicine | Admitting: Internal Medicine

## 2018-01-23 DIAGNOSIS — Z800 Family history of malignant neoplasm of digestive organs: Secondary | ICD-10-CM | POA: Diagnosis not present

## 2018-01-23 DIAGNOSIS — F17210 Nicotine dependence, cigarettes, uncomplicated: Secondary | ICD-10-CM | POA: Insufficient documentation

## 2018-01-23 DIAGNOSIS — K621 Rectal polyp: Secondary | ICD-10-CM | POA: Diagnosis not present

## 2018-01-23 DIAGNOSIS — Z79899 Other long term (current) drug therapy: Secondary | ICD-10-CM | POA: Insufficient documentation

## 2018-01-23 DIAGNOSIS — D123 Benign neoplasm of transverse colon: Secondary | ICD-10-CM | POA: Diagnosis not present

## 2018-01-23 DIAGNOSIS — Z1211 Encounter for screening for malignant neoplasm of colon: Secondary | ICD-10-CM | POA: Insufficient documentation

## 2018-01-23 HISTORY — PX: POLYPECTOMY: SHX5525

## 2018-01-23 HISTORY — PX: COLONOSCOPY: SHX5424

## 2018-01-23 SURGERY — COLONOSCOPY
Anesthesia: Moderate Sedation

## 2018-01-23 MED ORDER — MEPERIDINE HCL 50 MG/ML IJ SOLN
INTRAMUSCULAR | Status: AC
  Filled 2018-01-23: qty 1

## 2018-01-23 MED ORDER — ONDANSETRON HCL 4 MG/2ML IJ SOLN
INTRAMUSCULAR | Status: DC | PRN
  Administered 2018-01-23: 4 mg via INTRAVENOUS

## 2018-01-23 MED ORDER — MIDAZOLAM HCL 5 MG/5ML IJ SOLN
INTRAMUSCULAR | Status: AC
  Filled 2018-01-23: qty 10

## 2018-01-23 MED ORDER — SODIUM CHLORIDE 0.9 % IV SOLN
INTRAVENOUS | Status: DC
  Administered 2018-01-23: 12:00:00 via INTRAVENOUS

## 2018-01-23 MED ORDER — MIDAZOLAM HCL 5 MG/5ML IJ SOLN
INTRAMUSCULAR | Status: DC | PRN
  Administered 2018-01-23 (×2): 1 mg via INTRAVENOUS
  Administered 2018-01-23: 2 mg via INTRAVENOUS
  Administered 2018-01-23: 1 mg via INTRAVENOUS

## 2018-01-23 MED ORDER — ONDANSETRON HCL 4 MG/2ML IJ SOLN
INTRAMUSCULAR | Status: AC
  Filled 2018-01-23: qty 2

## 2018-01-23 MED ORDER — MEPERIDINE HCL 100 MG/ML IJ SOLN
INTRAMUSCULAR | Status: DC | PRN
  Administered 2018-01-23: 10 mg
  Administered 2018-01-23: 25 mg
  Administered 2018-01-23: 15 mg

## 2018-01-23 NOTE — Op Note (Signed)
[facility] Patient Name: [name] Potts Procedure Date: 01/23/2018 11:21 AM MRN: [id number] Date of Birth: Aug 13, 1970 Attending MD: Norvel Richards , MD CSN: [id number] Age: 47 Admit Type: Outpatient Procedure:                Colonoscopy Indications:              Screening in patient at increased risk: Family                            history of 1st-degree relative with colorectal                            cancer before age 41 years Providers:                Norvel Richards, MD, Janeece Riggers, RN, Aram Candela Referring MD:              Medicines:                Midazolam 5 mg IV, Meperidine 50 mg IV, Ondansetron                            4 mg IV Complications:            No immediate complications. Estimated Blood Loss:     Estimated blood loss was minimal. Procedure:                Pre-Anesthesia Assessment:                           - Prior to the procedure, a History and Physical                            was performed, and patient medications and                            allergies were reviewed. The patient's tolerance of                            previous anesthesia was also reviewed. The risks                            and benefits of the procedure and the sedation                            options and risks were discussed with the patient.                            All questions were answered, and informed consent                            was obtained. Prior Anticoagulants: The patient has  taken no previous anticoagulant or antiplatelet                            agents. ASA Grade Assessment: II - A patient with                            mild systemic disease. After reviewing the risks                            and benefits, the patient was deemed in                            satisfactory condition to undergo the procedure.                           After obtaining informed consent, the  colonoscope                            was passed under direct vision. Throughout the                            procedure, the patient's blood pressure, pulse, and                            oxygen saturations were monitored continuously. The                            CF-[id number] ([id number]) scope was introduced through                            the anus and advanced to the the cecum, identified                            by appendiceal orifice and ileocecal valve. The                            colonoscopy was performed without difficulty. The                            patient tolerated the procedure well. The quality                            of the bowel preparation was adequate. Scope In: 12:40:46 PM Scope Out: 12:54:30 PM Scope Withdrawal Time: 0 hours 9 minutes 51 seconds  Total Procedure Duration: 0 hours 13 minutes 44 seconds  Findings:      The perianal and digital rectal examinations were normal.      Two sessile polyps were found in the rectum and hepatic flexure. The       polyps were 5 to 7 mm in size. These polyps were removed with a cold       snare. Resection and retrieval were complete. Estimated blood loss was       minimal.      The exam was otherwise without abnormality on direct and retroflexion  views. Impression:               - Two 5 to 7 mm polyps in the rectum and at the                            hepatic flexure, removed with a cold snare.                            Resected and retrieved.                           - The examination was otherwise normal on direct                            and retroflexion views. Moderate Sedation:      Moderate (conscious) sedation was administered by the endoscopy nurse       and supervised by the endoscopist. The following parameters were       monitored: oxygen saturation, heart rate, blood pressure, respiratory       rate, EKG, adequacy of pulmonary ventilation, and response to care.       Total physician  intraservice time was 15 minutes. Recommendation:           - Patient has a contact number available for                            emergencies. The signs and symptoms of potential                            delayed complications were discussed with the                            patient. Return to normal activities tomorrow.                            Written discharge instructions were provided to the                            patient.                           - Resume previous diet.                           - Continue present medications.                           - Await pathology results.                           - Repeat colonoscopy date to be determined after                            pending pathology results are reviewed for                            surveillance.                           -  Return to GI office (date not yet determined). Procedure Code(s):        --- Professional ---                           [phone], Colonoscopy, flexible; with removal of                            tumor(s), polyp(s), or other lesion(s) by snare                            technique                           G0500, Moderate sedation services provided by the                            same physician or other qualified health care                            professional performing a gastrointestinal                            endoscopic service that sedation supports,                            requiring the presence of an independent trained                            observer to assist in the monitoring of the                            patient's level of consciousness and physiological                            status; initial 15 minutes of intra-service time;                            patient age 49 years or older (additional time may                            be reported with [phone], as appropriate) Diagnosis Code(s):        --- Professional ---                           Z80.0, Family  history of malignant neoplasm of                            digestive organs                           K62.1, Rectal polyp                           D12.3, Benign neoplasm of transverse colon (hepatic  flexure or splenic flexure) CPT copyright 2018 American Medical Association. All rights reserved. The codes documented in this report are preliminary and upon coder review may  be revised to meet current compliance requirements. Cristopher Estimable. Rourk, MD Norvel Richards, MD 01/23/2018 1:03:26 PM This report has been signed electronically. Number of Addenda: 0

## 2018-01-23 NOTE — Discharge Instructions (Signed)
Colon polyp information provided Colonoscopy Discharge Instructions  Read the instructions outlined below and refer to this sheet in the next few weeks. These discharge instructions provide you with general information on caring for yourself after you leave the hospital. Your doctor may also give you specific instructions. While your treatment has been planned according to the most current medical practices available, unavoidable complications occasionally occur. If you have any problems or questions after discharge, call Dr. Gala Romney at [phone]. ACTIVITY  You may resume your regular activity, but move at a slower pace for the next 24 hours.   Take frequent rest periods for the next 24 hours.   Walking will help get rid of the air and reduce the bloated feeling in your belly (abdomen).   No driving for 24 hours (because of the medicine (anesthesia) used during the test).    Do not sign any important legal documents or operate any machinery for 24 hours (because of the anesthesia used during the test).  NUTRITION  Drink plenty of fluids.   You may resume your normal diet as instructed by your doctor.   Begin with a light meal and progress to your normal diet. Heavy or fried foods are harder to digest and may make you feel sick to your stomach (nauseated).   Avoid alcoholic beverages for 24 hours or as instructed.  MEDICATIONS  You may resume your normal medications unless your doctor tells you otherwise.  WHAT YOU CAN EXPECT TODAY  Some feelings of bloating in the abdomen.   Passage of more gas than usual.   Spotting of blood in your stool or on the toilet paper.  IF YOU HAD POLYPS REMOVED DURING THE COLONOSCOPY:  No aspirin products for 7 days or as instructed.   No alcohol for 7 days or as instructed.   Eat a soft diet for the next 24 hours.  FINDING OUT THE RESULTS OF YOUR TEST Not all test results are available during your visit. If your test results are not back during  the visit, make an appointment with your caregiver to find out the results. Do not assume everything is normal if you have not heard from your caregiver or the medical facility. It is important for you to follow up on all of your test results.  SEEK IMMEDIATE MEDICAL ATTENTION IF:  You have more than a spotting of blood in your stool.   Your belly is swollen (abdominal distention).   You are nauseated or vomiting.   You have a temperature over 101.   You have abdominal pain or discomfort that is severe or gets worse throughout the day.   Colon polyp information provided  Further recommendations to follow pending review of pathology report   Colon Polyps Polyps are tissue growths inside the body. Polyps can grow in many places, including the large intestine (colon). A polyp may be a round bump or a mushroom-shaped growth. You could have one polyp or several. Most colon polyps are noncancerous (benign). However, some colon polyps can become cancerous over time. What are the causes? The exact cause of colon polyps is not known. What increases the risk? This condition is more likely to develop in people who:  Have a family history of colon cancer or colon polyps.  Are older than 78 or older than 45 if they are African American.  Have inflammatory bowel disease, such as ulcerative colitis or Crohn disease.  Are overweight.  Smoke cigarettes.  Do not get enough exercise.  Drink  too much alcohol.  Eat a diet that is: ? High in fat and red meat. ? Low in fiber.  Had childhood cancer that was treated with abdominal radiation.  What are the signs or symptoms? Most polyps do not cause symptoms. If you have symptoms, they may include:  Blood coming from your rectum when having a bowel movement.  Blood in your stool.The stool may look dark red or black.  A change in bowel habits, such as constipation or diarrhea.  How is this diagnosed? This condition is diagnosed with a  colonoscopy. This is a procedure that uses a lighted, flexible scope to look at the inside of your colon. How is this treated? Treatment for this condition involves removing any polyps that are found. Those polyps will then be tested for cancer. If cancer is found, your health care provider will talk to you about options for colon cancer treatment. Follow these instructions at home: Diet  Eat plenty of fiber, such as fruits, vegetables, and whole grains.  Eat foods that are high in calcium and vitamin D, such as milk, cheese, yogurt, eggs, liver, fish, and broccoli.  Limit foods high in fat, red meats, and processed meats, such as hot dogs, sausage, bacon, and lunch meats.  Maintain a healthy weight, or lose weight if recommended by your health care provider. General instructions  Do not smoke cigarettes.  Do not drink alcohol excessively.  Keep all follow-up visits as told by your health care provider. This is important. This includes keeping regularly scheduled colonoscopies. Talk to your health care provider about when you need a colonoscopy.  Exercise every day or as told by your health care provider. Contact a health care provider if:  You have new or worsening bleeding during a bowel movement.  You have new or increased blood in your stool.  You have a change in bowel habits.  You unexpectedly lose weight. This information is not intended to replace advice given to you by your health care provider. Make sure you discuss any questions you have with your health care provider. Document Released: 12/27/2003 Document Revised: 09/07/2015 Document Reviewed: 02/20/2015 Elsevier Interactive Patient Education  [employer].

## 2018-01-23 NOTE — H&P (Signed)
_0 @   Primary Care Physician:  Marjean Donna, MD (Inactive) Primary Gastroenterologist:  Dr. Gala Romney  Pre-Procedure History & Physical: HPI:  [name] Potts is a 47 y.o. male is here for a screening colonoscopy.   Past Medical History:  Diagnosis Date  . Chronic back pain   . Constipation   . Seasonal allergies     Past Surgical History:  Procedure Laterality Date  . COLONOSCOPY  10/11/2011   Procedure: COLONOSCOPY;  Surgeon: Daneil Dolin, MD;  Location: [facility];  Service: Endoscopy;  Laterality: N/A;  1:45 PM    Prior to Admission medications   Medication Sig Start Date End Date Taking? Authorizing Provider  loratadine (CLARITIN) 10 MG tablet Take 10 mg by mouth every evening.   Yes [provider]  Na Sulfate-K Sulfate-Mg Sulf (SUPREP BOWEL PREP KIT) 17.5-3.13-1.6 GM/177ML SOLN Take 1 kit by mouth as directed. 12/02/17  Yes Carlis Stable, NP  Naphazoline HCl (CLEAR EYES OP) Place 1 drop into both eyes daily as needed (redness / allergies).   Yes [provider]    Allergies as of 12/03/2017  . (No Known Allergies)    Family History  Problem Relation Age of Onset  . Colon cancer Mother 49       passed at age 22    Social History   Socioeconomic History  . Marital status: Married    Spouse name: Not on file  . Number of children: 1  . Years of education: Not on file  . Highest education level: Not on file  Occupational History  . Occupation: Museum/gallery exhibitions officer  . Financial resource strain: Not on file  . Food insecurity:    Worry: Not on file    Inability: Not on file  . Transportation needs:    Medical: Not on file    Non-medical: Not on file  Tobacco Use  . Smoking status: Current Some Day Smoker    Packs/day: 0.50  . Smokeless tobacco: Never Used  . Tobacco comment: using a electric cigarette  Substance and Sexual Activity  . Alcohol use: No  . Drug use: No  . Sexual activity: Yes  Lifestyle  . Physical  activity:    Days per week: Not on file    Minutes per session: Not on file  . Stress: Not on file  Relationships  . Social connections:    Talks on phone: Not on file    Gets together: Not on file    Attends religious service: Not on file    Active member of club or organization: Not on file    Attends meetings of clubs or organizations: Not on file    Relationship status: Not on file  . Intimate partner violence:    Fear of current or ex partner: Not on file    Emotionally abused: Not on file    Physically abused: Not on file    Forced sexual activity: Not on file  Other Topics Concern  . Not on file  Social History Narrative  . Not on file    Review of Systems: See HPI, otherwise negative ROS  Physical Exam: BP (!) 130/92   Pulse 88   Temp 98.8 F (37.1 C) (Oral)   Resp 13   Ht _1  (1.854 m)   Wt 87.5 kg   SpO2 100%   BMI 25.46 kg/m  General:   Alert,  Well-developed, well-nourished, pleasant and cooperative in NAD Lungs:  Clear throughout  to auscultation.   No wheezes, crackles, or rhonchi. No acute distress. Heart:  Regular rate and rhythm; no murmurs, clicks, rubs,  or gallops. Abdomen:  Soft, nontender and nondistended. No masses, hepatosplenomegaly or hernias noted. Normal bowel sounds, without guarding, and without rebound.   Impression/Plan: [name] Potts is now here to undergo a screening colonoscopy.  High risk screening examination  Risks, benefits, limitations, imponderables and alternatives regarding colonoscopy have been reviewed with the patient. Questions have been answered. All parties agreeable.     Notice:  This dictation was prepared with Dragon dictation along with smaller phrase technology. Any transcriptional errors that result from this process are unintentional and may not be corrected upon review.

## 2018-01-26 ENCOUNTER — Encounter: Payer: Self-pay | Admitting: Internal Medicine

## 2018-01-29 ENCOUNTER — Encounter (HOSPITAL_COMMUNITY): Payer: Self-pay | Admitting: Internal Medicine

## 2020-02-16 ENCOUNTER — Other Ambulatory Visit: Payer: Self-pay | Admitting: Orthopedic Surgery

## 2020-02-16 DIAGNOSIS — M542 Cervicalgia: Secondary | ICD-10-CM

## 2020-03-01 ENCOUNTER — Ambulatory Visit
Admission: RE | Admit: 2020-03-01 | Discharge: 2020-03-01 | Disposition: A | Discharge status: Home / Self Care | Payer: Commercial Managed Care - PPO | Source: Ambulatory Visit | Attending: Orthopedic Surgery | Admitting: Orthopedic Surgery

## 2020-03-01 ENCOUNTER — Other Ambulatory Visit: Payer: Self-pay | Admitting: Nurse Practitioner

## 2020-03-01 DIAGNOSIS — M79622 Pain in left upper arm: Secondary | ICD-10-CM

## 2020-03-01 DIAGNOSIS — E785 Hyperlipidemia, unspecified: Secondary | ICD-10-CM

## 2020-03-01 DIAGNOSIS — R5383 Other fatigue: Secondary | ICD-10-CM

## 2020-03-01 DIAGNOSIS — J302 Other seasonal allergic rhinitis: Secondary | ICD-10-CM

## 2020-03-01 DIAGNOSIS — N401 Enlarged prostate with lower urinary tract symptoms: Secondary | ICD-10-CM

## 2020-03-01 DIAGNOSIS — R945 Abnormal results of liver function studies: Secondary | ICD-10-CM

## 2020-03-01 DIAGNOSIS — M542 Cervicalgia: Secondary | ICD-10-CM

## 2020-03-07 ENCOUNTER — Other Ambulatory Visit: Payer: Self-pay

## 2020-03-07 ENCOUNTER — Encounter: Payer: Self-pay | Admitting: Nurse Practitioner

## 2020-03-07 ENCOUNTER — Ambulatory Visit (INDEPENDENT_AMBULATORY_CARE_PROVIDER_SITE_OTHER): Payer: Commercial Managed Care - PPO | Admitting: Nurse Practitioner

## 2020-03-07 VITALS — BP 145/91 | HR 90 | Temp 97.4°F | Resp 20 | Ht 73.0 in | Wt 196.0 lb

## 2020-03-07 DIAGNOSIS — N401 Enlarged prostate with lower urinary tract symptoms: Secondary | ICD-10-CM

## 2020-03-07 DIAGNOSIS — F909 Attention-deficit hyperactivity disorder, unspecified type: Secondary | ICD-10-CM

## 2020-03-07 DIAGNOSIS — R7989 Other specified abnormal findings of blood chemistry: Secondary | ICD-10-CM

## 2020-03-07 DIAGNOSIS — M79622 Pain in left upper arm: Secondary | ICD-10-CM

## 2020-03-07 DIAGNOSIS — J302 Other seasonal allergic rhinitis: Secondary | ICD-10-CM | POA: Diagnosis not present

## 2020-03-07 DIAGNOSIS — R945 Abnormal results of liver function studies: Secondary | ICD-10-CM

## 2020-03-07 DIAGNOSIS — E785 Hyperlipidemia, unspecified: Secondary | ICD-10-CM

## 2020-03-07 DIAGNOSIS — N138 Other obstructive and reflux uropathy: Secondary | ICD-10-CM

## 2020-03-07 MED ORDER — HYDROCODONE-ACETAMINOPHEN 5-325 MG PO TABS: 1.0000 | ORAL_TABLET | Freq: Four times a day (QID) | ORAL | 0 refills | Status: DC | PRN

## 2020-03-07 NOTE — Progress Notes (Signed)
New Patient Office Visit  Subjective:  Patient ID: [name] Potts, male    DOB: [birth year]  Age: 49 y.o. MRN: [id number]  CC:  Chief Complaint  Patient presents with  . New Patient (Initial Visit)  . Neck Pain    HPI [name] presents to establish care.  He is transferring care from Dr. Juel Burrow. Last labs were drawn in May 2021.  Past Medical History:  Diagnosis Date  . Chronic back pain   . Constipation   . Seasonal allergies     Past Surgical History:  Procedure Laterality Date  . COLONOSCOPY  10/11/2011   Procedure: COLONOSCOPY;  Surgeon: Daneil Dolin, MD;  Location: [facility];  Service: Endoscopy;  Laterality: N/A;  1:45 PM  . COLONOSCOPY N/A 01/23/2018   Procedure: COLONOSCOPY;  Surgeon: Daneil Dolin, MD;  Location: [facility];  Service: Endoscopy;  Laterality: N/A;  1:00  . POLYPECTOMY  01/23/2018   Procedure: POLYPECTOMY;  Surgeon: Daneil Dolin, MD;  Location: [facility];  Service: Endoscopy;;    Family History  Problem Relation Age of Onset  . Colon cancer Mother 7       passed at age 67    Social History   Socioeconomic History  . Marital status: Married    Spouse name: Not on file  . Number of children: 1  . Years of education: Not on file  . Highest education level: Not on file  Occupational History  . Occupation: Librarian, academic  Tobacco Use  . Smoking status: Current Some Day Smoker    Packs/day: 0.50  . Smokeless tobacco: Never Used  . Tobacco comment: using a electric cigarette  Substance and Sexual Activity  . Alcohol use: No  . Drug use: No  . Sexual activity: Yes  Other Topics Concern  . Not on file  Social History Narrative  . Not on file   Social Determinants of Health   Financial Resource Strain:   . Difficulty of Paying Living Expenses: Not on file  Food Insecurity:   . Worried About Charity fundraiser in the Last Year: Not on file  . Ran Out of Food in the Last Year: Not on file  Transportation Needs:    . Lack of Transportation (Medical): Not on file  . Lack of Transportation (Non-Medical): Not on file  Physical Activity:   . Days of Exercise per Week: Not on file  . Minutes of Exercise per Session: Not on file  Stress:   . Feeling of Stress : Not on file  Social Connections:   . Frequency of Communication with Friends and Family: Not on file  . Frequency of Social Gatherings with Friends and Family: Not on file  . Attends Religious Services: Not on file  . Active Member of Clubs or Organizations: Not on file  . Attends Archivist Meetings: Not on file  . Marital Status: Not on file  Intimate Partner Violence:   . Fear of Current or Ex-Partner: Not on file  . Emotionally Abused: Not on file  . Physically Abused: Not on file  . Sexually Abused: Not on file    ROS Review of Systems  Constitutional: Negative.   Respiratory: Negative.   Cardiovascular: Negative.   Musculoskeletal: Positive for neck pain.  Neurological:       Shooting pain down right arm    Objective:   Today's Vitals: BP (!) 145/91   Pulse 90   Temp (!)  97.4 F (36.3 C)   Resp 20   Ht $R'6\' 1"'BR$  (1.854 m)   Wt 196 lb (88.9 kg)   SpO2 96%   BMI 25.86 kg/m   Physical Exam Constitutional:      Appearance: Normal appearance.  Cardiovascular:     Rate and Rhythm: Normal rate and regular rhythm.     Pulses: Normal pulses.     Heart sounds: Normal heart sounds.  Pulmonary:     Effort: Pulmonary effort is normal.     Breath sounds: Normal breath sounds.  Musculoskeletal:     Cervical back: Tenderness present.     Comments: Neck pain that radiates down right arm with ROM exercises.  Neurological:     Mental Status: He is alert and oriented to person, place, and time.     Assessment & Plan:   Problem List Items Addressed This Visit    None    Visit Diagnoses    Hyperlipidemia, unspecified hyperlipidemia type    -  Primary   Relevant Medications   atorvastatin (LIPITOR) 40 MG tablet    Other Relevant Orders   CBC with Differential/Platelet   CMP14+EGFR   Lipid Panel With LDL/HDL Ratio   Abnormal LFTs       Seasonal allergic rhinitis, unspecified trigger       BPH with urinary obstruction       Pain of left upper arm       Attention deficit hyperactivity disorder (ADHD), unspecified ADHD type         Hyperlipidemia -no labs to review today -will draw labs for next appt -still taking atorvastatin -taking vascepa??  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 Hydrocodone x 1 week; if more needed he can discuss with [facility]  ADHD -followed by psych -takes adderall   Outpatient Encounter Medications as of 03/07/2020  Medication Sig  . amphetamine-dextroamphetamine (ADDERALL XR) 10 MG 24 hr capsule Take 10 mg by mouth in the morning and at bedtime.  Marland Kitchen atorvastatin (LIPITOR) 40 MG tablet Take 40 mg by mouth daily.  Marland Kitchen loratadine (CLARITIN) 10 MG tablet Take 10 mg by mouth every evening. (Patient not taking: Reported on 03/07/2020)  . [DISCONTINUED] Na Sulfate-K Sulfate-Mg Sulf (SUPREP BOWEL PREP KIT) 17.5-3.13-1.6 GM/177ML SOLN Take 1 kit by mouth as directed. (Patient not taking: Reported on 03/07/2020)  . [DISCONTINUED] Naphazoline HCl (CLEAR EYES OP) Place 1 drop into both eyes daily as needed (redness / allergies). (Patient not taking: Reported on 03/07/2020)   No  facility-administered encounter medications on file as of 03/07/2020.    Follow-up: Return in about 1 week (around 03/14/2020) for Lab follow-up.   Noreene Larsson, NP

## 2020-03-16 ENCOUNTER — Ambulatory Visit: Payer: Commercial Managed Care - PPO | Admitting: Nurse Practitioner

## 2020-03-21 ENCOUNTER — Ambulatory Visit: Payer: Commercial Managed Care - PPO | Admitting: Nurse Practitioner

## 2020-04-03 ENCOUNTER — Encounter: Payer: Commercial Managed Care - PPO | Admitting: Nurse Practitioner

## 2020-04-03 ENCOUNTER — Other Ambulatory Visit: Payer: Self-pay

## 2020-04-04 LAB — CMP14+EGFR
ALT: 41 IU/L (ref 0–44)
AST: 22 IU/L (ref 0–40)
Albumin/Globulin Ratio: 2.1 (ref 1.2–2.2)
Albumin: 4.5 g/dL (ref 3.8–4.9)
Alkaline Phosphatase: 103 IU/L (ref 44–121)
BUN/Creatinine Ratio: 14 (ref 9–20)
BUN: 14 mg/dL (ref 6–24)
Bilirubin Total: 0.5 mg/dL (ref 0.0–1.2)
CO2: 22 mmol/L (ref 20–29)
Calcium: 9.3 mg/dL (ref 8.7–10.2)
Chloride: 102 mmol/L (ref 96–106)
Creatinine, Ser: 0.97 mg/dL (ref 0.76–1.27)
GFR calc Af Amer: 104 mL/min/{1.73_m2} (ref >59)
GFR calc non Af Amer: 90 mL/min/{1.73_m2} (ref >59)
Globulin, Total: 2.1 g/dL (ref 1.5–4.5)
Glucose: 93 mg/dL (ref 65–99)
Potassium: 4.3 mmol/L (ref 3.5–5.2)
Sodium: 141 mmol/L (ref 134–144)
Total Protein: 6.6 g/dL (ref 6.0–8.5)

## 2020-04-04 LAB — LIPID PANEL WITH LDL/HDL RATIO
Cholesterol, Total: 171 mg/dL (ref 100–199)
HDL: 34 mg/dL — ABNORMAL LOW (ref >39)
LDL Chol Calc (NIH): 111 mg/dL — ABNORMAL HIGH (ref 0–99)
LDL/HDL Ratio: 3.3 ratio (ref 0.0–3.6)
Triglycerides: 146 mg/dL (ref 0–149)
VLDL Cholesterol Cal: 26 mg/dL (ref 5–40)

## 2020-04-04 LAB — CBC WITH DIFFERENTIAL/PLATELET
Basophils Absolute: 0.1 10*3/uL (ref 0.0–0.2)
Basos: 1 %
EOS (ABSOLUTE): 0.1 10*3/uL (ref 0.0–0.4)
Eos: 1 %
Hematocrit: 48.5 % (ref 37.5–51.0)
Hemoglobin: 15.8 g/dL (ref 13.0–17.7)
Immature Grans (Abs): 0.1 10*3/uL (ref 0.0–0.1)
Immature Granulocytes: 1 %
Lymphocytes Absolute: 2.3 10*3/uL (ref 0.7–3.1)
Lymphs: 25 %
MCH: 29.8 pg (ref 26.6–33.0)
MCHC: 32.6 g/dL (ref 31.5–35.7)
MCV: 92 fL (ref 79–97)
Monocytes Absolute: 0.6 10*3/uL (ref 0.1–0.9)
Monocytes: 6 %
Neutrophils Absolute: 6.2 10*3/uL (ref 1.4–7.0)
Neutrophils: 66 %
Platelets: 217 10*3/uL (ref 150–450)
RBC: 5.3 x10E6/uL (ref 4.14–5.80)
RDW: 13.1 % (ref 11.6–15.4)
WBC: 9.3 10*3/uL (ref 3.4–10.8)

## 2020-04-04 NOTE — Progress Notes (Signed)
This visit (04/04/20) was rescheduled because he didn't have labs.

## 2020-04-06 ENCOUNTER — Encounter: Payer: Self-pay | Admitting: Nurse Practitioner

## 2020-04-06 ENCOUNTER — Other Ambulatory Visit: Payer: Self-pay

## 2020-04-06 ENCOUNTER — Ambulatory Visit (INDEPENDENT_AMBULATORY_CARE_PROVIDER_SITE_OTHER): Payer: Commercial Managed Care - PPO | Admitting: Nurse Practitioner

## 2020-04-06 VITALS — BP 127/88 | HR 70 | Temp 98.8°F | Resp 20 | Ht 73.0 in | Wt 198.0 lb

## 2020-04-06 DIAGNOSIS — E7800 Pure hypercholesterolemia, unspecified: Secondary | ICD-10-CM

## 2020-04-06 DIAGNOSIS — E782 Mixed hyperlipidemia: Secondary | ICD-10-CM | POA: Insufficient documentation

## 2020-04-06 DIAGNOSIS — F909 Attention-deficit hyperactivity disorder, unspecified type: Secondary | ICD-10-CM

## 2020-04-06 DIAGNOSIS — J302 Other seasonal allergic rhinitis: Secondary | ICD-10-CM

## 2020-04-06 DIAGNOSIS — N400 Enlarged prostate without lower urinary tract symptoms: Secondary | ICD-10-CM

## 2020-04-06 DIAGNOSIS — M79601 Pain in right arm: Secondary | ICD-10-CM

## 2020-04-06 DIAGNOSIS — R945 Abnormal results of liver function studies: Secondary | ICD-10-CM | POA: Diagnosis not present

## 2020-04-06 DIAGNOSIS — E785 Hyperlipidemia, unspecified: Secondary | ICD-10-CM | POA: Insufficient documentation

## 2020-04-06 NOTE — Assessment & Plan Note (Signed)
-  last ALT was 58 -ALT and all LFTs are WNL today

## 2020-04-06 NOTE — Assessment & Plan Note (Signed)
-  followed by psych, [name] Johnnye Sima -takes adderall

## 2020-04-06 NOTE — Assessment & Plan Note (Signed)
Lab Results  Component Value Date   CHOL 171 04/03/2020   HDL 34 (L) 04/03/2020   LDLCALC 111 (H) 04/03/2020   TRIG 146 04/03/2020  -Continue atorvastatin -no longer taking vascepa -goal LDL < 100, but he is close to goal today; may consider increasing atorvastatin if still above goal at next visit
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
## 2020-04-06 NOTE — Patient Instructions (Signed)
I referred you to [facility] for your neck/arm pain.  Their office will contact you for scheduling.   I will see you back in 6 months for labs.
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
## 2020-04-06 NOTE — Assessment & Plan Note (Signed)
-  will check PSA with next set of labs

## 2020-04-20 ENCOUNTER — Telehealth: Payer: Self-pay

## 2020-04-20 ENCOUNTER — Other Ambulatory Visit: Payer: Self-pay | Admitting: Neurological Surgery

## 2020-04-20 DIAGNOSIS — M5412 Radiculopathy, cervical region: Secondary | ICD-10-CM

## 2020-05-04 ENCOUNTER — Other Ambulatory Visit: Payer: Self-pay

## 2020-05-04 ENCOUNTER — Telehealth: Payer: Commercial Managed Care - PPO | Admitting: Nurse Practitioner

## 2020-05-04 ENCOUNTER — Encounter: Payer: Self-pay | Admitting: Nurse Practitioner

## 2020-05-04 DIAGNOSIS — J069 Acute upper respiratory infection, unspecified: Secondary | ICD-10-CM

## 2020-05-04 MED ORDER — HYDROCODONE-HOMATROPINE 5-1.5 MG/5ML PO SYRP: 5.0000 mL | ORAL_SOLUTION | Freq: Three times a day (TID) | ORAL | 0 refills | Status: DC | PRN

## 2020-05-04 MED ORDER — NOREL AD 4-10-325 MG PO TABS: 1.0000 | ORAL_TABLET | ORAL | 1 refills | Status: DC | PRN

## 2020-05-04 MED ORDER — AMOXICILLIN-POT CLAVULANATE 875-125 MG PO TABS: 1.0000 | ORAL_TABLET | Freq: Two times a day (BID) | ORAL | 0 refills | Status: DC

## 2020-05-04 NOTE — Assessment & Plan Note (Signed)
-  had symptoms x5 days -home COVID was negative -Rx. Augmentin; he started this at home from leftover meds -Rx. norel -Rx. Hycodan

## 2020-05-04 NOTE — Progress Notes (Signed)
Acute Office Visit  Subjective:    Patient ID: [name] Potts, male    DOB: 05/28/1970, 50 y.o.   MRN: [id number]  Chief Complaint  Patient presents with  . Cough    X 5 days   . Nasal Congestion    X 5 days   . Fever    X 2 days     HPI Patient is in today for sick visit. Symptoms per ROS. He took 2x home COVID tests. He has tried taking amoxicillin that was leftover and tylenol, ibuprofen, allergy relief (store brand allegra).  Past Medical History:  Diagnosis Date  . Chronic back pain   . Constipation   . Seasonal allergies     Past Surgical History:  Procedure Laterality Date  . COLONOSCOPY  10/11/2011   Procedure: COLONOSCOPY;  Surgeon: Daneil Dolin, MD;  [facility];  Service: Endoscopy;  Laterality: N/A;  1:45 PM  . COLONOSCOPY N/A 01/23/2018   Procedure: COLONOSCOPY;  Surgeon: Daneil Dolin, MD;  [facility];  Service: Endoscopy;  Laterality: N/A;  1:00  . POLYPECTOMY  01/23/2018   Procedure: POLYPECTOMY;  Surgeon: Daneil Dolin, MD;  [facility];  Service: Endoscopy;;    Family History  Problem Relation Age of Onset  . Colon cancer Mother 4       passed at age 51    Social History   Socioeconomic History  . Marital status: Married    Spouse name: Not on file  . Number of children: 1  . Years of education: Not on file  . Highest education level: Not on file  Occupational History  . Occupation: Librarian, academic  Tobacco Use  . Smoking status: Current Some Day Smoker    Packs/day: 0.50  . Smokeless tobacco: Never Used  . Tobacco comment: using a electric cigarette  Substance and Sexual Activity  . Alcohol use: No  . Drug use: No  . Sexual activity: Yes  Other Topics Concern  . Not on file  Social History Narrative  . Not on file   Social Determinants of Health   Financial Resource Strain: Not on file  Food Insecurity: Not on file  Transportation Needs: Not on file  Physical Activity: Not on file   Stress: Not on file  Social Connections: Not on file  Intimate Partner Violence: Not on file    Outpatient Medications Prior to Visit  Medication Sig Dispense Refill  . amphetamine-dextroamphetamine (ADDERALL XR) 10 MG 24 hr capsule Take 10 mg by mouth in the morning and at bedtime.    Marland Kitchen atorvastatin (LIPITOR) 40 MG tablet Take 40 mg by mouth daily.    Marland Kitchen HYDROcodone-acetaminophen (NORCO/VICODIN) 5-325 MG tablet Take 1 tablet by mouth every 6 (six) hours as needed for moderate pain. 30 tablet 0  . loratadine (CLARITIN) 10 MG tablet Take 10 mg by mouth every evening.     No facility-administered medications prior to visit.    Allergies  Allergen Reactions  . Other     Review of Systems  Constitutional: Positive for chills, fatigue and fever.  HENT: Positive for congestion, rhinorrhea, sinus pressure, sinus pain and sore throat. Negative for ear pain.   Respiratory: Positive for cough, chest tightness and shortness of breath. Negative for wheezing.        SOB with exertion  Cardiovascular: Negative.   Gastrointestinal: Positive for diarrhea. Negative for abdominal pain, nausea and vomiting.       Objective:  Physical Exam  There were no vitals taken for this visit. Wt Readings from Last 3 Encounters:  04/06/20 198 lb (89.8 kg)  03/07/20 196 lb (88.9 kg)  01/23/18 193 lb (87.5 kg)    Health Maintenance Due  Topic Date Due  . Hepatitis C Screening  Never done  . COVID-19 Vaccine (1) Never done  . HIV Screening  Never done  . TETANUS/TDAP  Never done  . INFLUENZA VACCINE  Never done    There are no preventive care reminders to display for this patient.   No results found for: TSH Lab Results  Component Value Date   WBC 9.3 04/03/2020   HGB 15.8 04/03/2020   HCT 48.5 04/03/2020   MCV 92 04/03/2020   PLT 217 04/03/2020   Lab Results  Component Value Date   NA 141 04/03/2020   K 4.3 04/03/2020   CO2 22 04/03/2020   GLUCOSE 93 04/03/2020   BUN 14  04/03/2020   CREATININE 0.97 04/03/2020   BILITOT 0.5 04/03/2020   ALKPHOS 103 04/03/2020   AST 22 04/03/2020   ALT 41 04/03/2020   PROT 6.6 04/03/2020   ALBUMIN 4.5 04/03/2020   CALCIUM 9.3 04/03/2020   Lab Results  Component Value Date   CHOL 171 04/03/2020   Lab Results  Component Value Date   HDL 34 (L) 04/03/2020   Lab Results  Component Value Date   LDLCALC 111 (H) 04/03/2020   Lab Results  Component Value Date   TRIG 146 04/03/2020   No results found for: CHOLHDL No results found for: HGBA1C     Assessment & Plan:   Problem List Items Addressed This Visit      Respiratory   URI (upper respiratory infection)    -had symptoms x5 days -home COVID was negative -Rx. Augmentin; he started this at home from leftover meds -Rx. norel -Rx. Hycodan           Meds ordered this encounter  Medications  . Chlorphen-PE-Acetaminophen (NOREL AD) 4-10-325 MG TABS    Sig: Take 1 tablet by mouth every 4 (four) hours as needed (nasal congestion, cold symptoms).    Dispense:  20 tablet    Refill:  1  . HYDROcodone-homatropine (HYCODAN) 5-1.5 MG/5ML syrup    Sig: Take 5 mLs by mouth every 8 (eight) hours as needed for cough.    Dispense:  120 mL    Refill:  0  . amoxicillin-clavulanate (AUGMENTIN) 875-125 MG tablet    Sig: Take 1 tablet by mouth 2 (two) times daily.    Dispense:  20 tablet    Refill:  0   Date:  05/04/2020   Location of Patient: Home Location of Provider: Office Consent was obtain for visit to be over via telehealth. I verified that I am speaking with the correct person using two identifiers.  I connected with  [name] Potts on 05/04/20 via telephone and verified that I am speaking with the correct person using two identifiers.   I discussed the limitations of evaluation and management by telemedicine. The patient expressed understanding and agreed to proceed.  Time spent: 20 minutes   Noreene Larsson, NP

## 2020-05-05 ENCOUNTER — Other Ambulatory Visit: Payer: Commercial Managed Care - PPO

## 2020-05-05 ENCOUNTER — Inpatient Hospital Stay: Admission: RE | Admit: 2020-05-05 | Payer: Commercial Managed Care - PPO | Source: Ambulatory Visit

## 2020-05-12 ENCOUNTER — Other Ambulatory Visit: Payer: Commercial Managed Care - PPO

## 2020-05-19 ENCOUNTER — Other Ambulatory Visit: Payer: Self-pay

## 2020-05-19 ENCOUNTER — Ambulatory Visit
Admission: RE | Admit: 2020-05-19 | Discharge: 2020-05-19 | Disposition: A | Discharge status: Home / Self Care | Payer: Commercial Managed Care - PPO | Source: Ambulatory Visit | Attending: Neurological Surgery | Admitting: Neurological Surgery

## 2020-05-19 DIAGNOSIS — M5412 Radiculopathy, cervical region: Secondary | ICD-10-CM

## 2020-05-19 MED ORDER — ONDANSETRON HCL 4 MG/2ML IJ SOLN
4.0000 mg | Freq: Once | INTRAMUSCULAR | Status: AC
  Administered 2020-05-19: 4 mg via INTRAMUSCULAR

## 2020-05-19 MED ORDER — MEPERIDINE HCL 50 MG/ML IJ SOLN
50.0000 mg | Freq: Once | INTRAMUSCULAR | Status: AC
  Administered 2020-05-19: 50 mg via INTRAMUSCULAR

## 2020-05-19 MED ORDER — IOPAMIDOL (ISOVUE-M 300) INJECTION 61%
10.0000 mL | Freq: Once | INTRAMUSCULAR | Status: AC
  Administered 2020-05-19: 10 mL via INTRATHECAL

## 2020-05-19 MED ORDER — DIAZEPAM 5 MG PO TABS
10.0000 mg | ORAL_TABLET | Freq: Once | ORAL | Status: AC
  Administered 2020-05-19: 10 mg via ORAL

## 2020-05-19 NOTE — Discharge Instructions (Signed)
Myelogram Discharge Instructions  1. Go home and rest quietly for the next 24 hours.  It is important to lie flat for the next 24 hours.  Get up only to go to the restroom.  You may lie in the bed or on a couch on your back, your stomach, your left side or your right side.  You may have one pillow under your head.  You may have pillows between your knees while you are on your side or under your knees while you are on your back.  2. DO NOT drive today.  Recline the seat as far back as it will go, while still wearing your seat belt, on the way home.  3. You may get up to go to the bathroom as needed.  You may sit up for 10 minutes to eat.  You may resume your normal diet and medications unless otherwise indicated.  Drink lots of extra fluids today and tomorrow.  4. The incidence of headache, nausea, or vomiting is about 5% (one in 20 patients).  If you develop a headache, lie flat and drink plenty of fluids until the headache goes away.  Caffeinated beverages may be helpful.  If you develop severe nausea and vomiting or a headache that does not go away with flat bed rest, call [phone].  5. You may resume normal activities after your 24 hours of bed rest is over; however, do not exert yourself strongly or do any heavy lifting tomorrow. If when you get up you have a headache when standing, go back to bed and force fluids for another 24 hours.  6. Call your physician for a follow-up appointment.  The results of your myelogram will be sent directly to your physician by the following day.  7. If you have any questions or if complications develop after you arrive home, please call [phone].  Discharge instructions have been explained to the patient.  The patient, or the person responsible for the patient, fully understands these instructions  YOU MAY TAKE YOUR ADDERALL TOMORROW ON 05/20/20 @ 10:30AM.

## 2020-05-19 NOTE — Discharge Instr - Other Orders (Signed)
Pt c/o pain in his head and neck 9/10 from the procedure. See MAR. Pt reports relief from pain ranking pain 0/10 upon discharge.

## 2020-05-19 NOTE — Progress Notes (Addendum)
Pt reports he has been off of Adderall for 48 hours and verbalizes understanding not to restart this medication until tomorrow 05/20/20.

## 2020-06-28 ENCOUNTER — Telehealth: Payer: Self-pay

## 2020-06-28 NOTE — Telephone Encounter (Signed)
Patient called need med refill: atorvastatin (LIPITOR) 40 MG tablet  Pharmacy: [facility] [phone]

## 2020-06-29 ENCOUNTER — Other Ambulatory Visit: Payer: Self-pay

## 2020-06-29 DIAGNOSIS — E7800 Pure hypercholesterolemia, unspecified: Secondary | ICD-10-CM

## 2020-06-29 MED ORDER — ATORVASTATIN CALCIUM 40 MG PO TABS: 40.0000 mg | ORAL_TABLET | Freq: Every day | ORAL | 1 refills | Status: DC

## 2020-06-29 NOTE — Telephone Encounter (Signed)
Order sent.

## 2020-07-03 ENCOUNTER — Other Ambulatory Visit: Payer: Self-pay

## 2020-07-03 DIAGNOSIS — E7800 Pure hypercholesterolemia, unspecified: Secondary | ICD-10-CM

## 2020-07-03 MED ORDER — ATORVASTATIN CALCIUM 40 MG PO TABS: 40.0000 mg | ORAL_TABLET | Freq: Every day | ORAL | 1 refills | Status: DC

## 2020-10-05 ENCOUNTER — Ambulatory Visit: Payer: Commercial Managed Care - PPO | Admitting: Nurse Practitioner

## 2020-10-11 ENCOUNTER — Ambulatory Visit: Payer: Commercial Managed Care - PPO | Admitting: Nurse Practitioner

## 2020-10-26 ENCOUNTER — Telehealth (INDEPENDENT_AMBULATORY_CARE_PROVIDER_SITE_OTHER): Payer: Commercial Managed Care - PPO | Admitting: Family Medicine

## 2020-10-26 ENCOUNTER — Other Ambulatory Visit: Payer: Self-pay

## 2020-10-26 ENCOUNTER — Encounter: Payer: Self-pay | Admitting: Family Medicine

## 2020-10-26 DIAGNOSIS — U071 COVID-19: Secondary | ICD-10-CM

## 2020-10-26 MED ORDER — NIRMATRELVIR/RITONAVIR (PAXLOVID)TABLET: 3.0000 | ORAL_TABLET | Freq: Two times a day (BID) | ORAL | 0 refills | Status: AC

## 2020-10-26 NOTE — Progress Notes (Signed)
Virtual Visit via Telephone Note  I connected with [name] Potts on 10/26/20 at 10:00 AM EDT by telephone and verified that I am speaking with the correct person using two identifiers.  Location: Patient: home Provider: office   I discussed the limitations, risks, security and privacy concerns of performing an evaluation and management service by telephone and the availability of in person appointments. I also discussed with the patient that there may be a patient responsible charge related to this service. The patient expressed understanding and agreed to proceed.   History of Present Illness: Acute covid infection symptomatic on 7/13, never vaccinated, positive test on 10/25/2020  Temp up to 101,has been taking tylenol and c/o body aches , headache  , cough and fatigue. Denies shortness of breath or difficulty breathing States he spoke with Md at work and does not need work excuse   Observations/Objective: There were no vitals taken for this visit. Good communication with no confusion and intact memory. Alert and oriented x 3 No signs of respiratory distress during speech   Assessment and Plan:  COVID-19 virus infection paxlovid prescribed and symptomatic treatment with tylenol , aleve and robitussin also dicussed Fluids, rest and 1 week out of work advised, offered re testing and work note states that needs neither and I assume will be  Handled by his job Advised if he developed worsening symptoms he should go to the ED    Follow Up Instructions:    I discussed the assessment and treatment plan with the patient. The patient was provided an opportunity to ask questions and all were answered. The patient agreed with the plan and demonstrated an understanding of the instructions.   The patient was advised to call back or seek an in-person evaluation if the symptoms worsen or if the condition fails to improve as anticipated.  I provided 9 minutes of non-face-to-face time during  this encounter.   Tula Nakayama, MD

## 2020-10-26 NOTE — Patient Instructions (Addendum)
Follow-up in office with [name] Potts in the next 3 to 6 weeks call if you need a sooner appointment  You are treated for COVID infection and a 5-day course of antiviral medication is prescribed please take the entire course as directed.  For generalized body aches headaches and take Tylenol 3 25 mg 1 to 2 tablets up to 3 times per day.  You may alternate this with Aleve 1 tablet twice a day.  Ensure that you drink sufficient fluids at least 64 ounces of water daily and get a lot of rest while he is still sick.  For head congestion and stuffy nose you may take a decongestant but Sudafed 1 a day.  For chest congestion and cough you may use Robitussin DM 1 teaspoon twice daily.  You need to remain in quarantine for the next 7 days while you are actively infected and you should be able to  return to work in 1 week.  Please give serious consideration to taking vaccines  Thanks for choosing [facility], we consider it a privelige to serve you.

## 2020-10-27 ENCOUNTER — Encounter: Payer: Self-pay | Admitting: Family Medicine

## 2020-10-27 ENCOUNTER — Telehealth: Payer: Self-pay | Admitting: Nurse Practitioner

## 2020-10-27 NOTE — Telephone Encounter (Signed)
Pt is calling to speak with Provider, I advised that all calls goes to the nurse first

## 2020-10-27 NOTE — Assessment & Plan Note (Signed)
paxlovid prescribed and symptomatic treatment with tylenol , aleve and robitussin also dicussed Fluids, rest and 1 week out of work advised, offered re testing and work note states that needs neither and I assume will be  Handled by his job Advised if he developed worsening symptoms he should go to the ED

## 2020-10-30 ENCOUNTER — Other Ambulatory Visit: Payer: Self-pay | Admitting: Nurse Practitioner

## 2020-10-30 DIAGNOSIS — U071 COVID-19: Secondary | ICD-10-CM

## 2020-10-30 DIAGNOSIS — M545 Low back pain, unspecified: Secondary | ICD-10-CM

## 2020-10-30 MED ORDER — TRAMADOL HCL 50 MG PO TABS
50.0000 mg | ORAL_TABLET | Freq: Three times a day (TID) | ORAL | 0 refills | Status: AC | PRN
Start: 2020-10-30 — End: 2020-11-04

## 2020-10-30 MED ORDER — IBUPROFEN 600 MG PO TABS
600.0000 mg | ORAL_TABLET | Freq: Three times a day (TID) | ORAL | 0 refills | Status: DC | PRN
Start: 2020-10-30 — End: 2020-11-22

## 2020-10-30 MED ORDER — HYDROCODONE BIT-HOMATROP MBR 5-1.5 MG/5ML PO SOLN
5.0000 mL | Freq: Three times a day (TID) | ORAL | 0 refills | Status: DC | PRN
Start: 2020-10-30 — End: 2020-11-22

## 2020-10-30 NOTE — Telephone Encounter (Signed)
Pt informed

## 2020-10-30 NOTE — Telephone Encounter (Signed)
Pt tested positive for Covid last Weds. [facility] sent him in [place] on Thursday. His back is hurting bad, he wanted to know if you could send him in anything for the pain, preferably Hydrocodone that he has taken in the past. He said he's coughing so hard that it's making his body hurt even worse. Please advise.

## 2020-10-30 NOTE — Telephone Encounter (Signed)
I sent in ibuprofen, tramadol, and hycodan cough syrup (Has hydrocodone in it). I don't want to give him too many narcotics together, so that is why I did tramadol instead of hydrocodone, because there is some hydrocodone in the cough syrup as well.

## 2020-11-22 ENCOUNTER — Encounter: Payer: Self-pay | Admitting: Nurse Practitioner

## 2020-11-22 ENCOUNTER — Other Ambulatory Visit: Payer: Self-pay

## 2020-11-22 ENCOUNTER — Ambulatory Visit: Payer: Commercial Managed Care - PPO | Admitting: Nurse Practitioner

## 2020-11-22 VITALS — BP 126/81 | HR 85 | Temp 98.7°F | Ht 72.0 in | Wt 189.0 lb

## 2020-11-22 DIAGNOSIS — N529 Male erectile dysfunction, unspecified: Secondary | ICD-10-CM

## 2020-11-22 DIAGNOSIS — E7800 Pure hypercholesterolemia, unspecified: Secondary | ICD-10-CM | POA: Diagnosis not present

## 2020-11-22 DIAGNOSIS — S29012A Strain of muscle and tendon of back wall of thorax, initial encounter: Secondary | ICD-10-CM

## 2020-11-22 DIAGNOSIS — R945 Abnormal results of liver function studies: Secondary | ICD-10-CM

## 2020-11-22 DIAGNOSIS — R0781 Pleurodynia: Secondary | ICD-10-CM

## 2020-11-22 DIAGNOSIS — N400 Enlarged prostate without lower urinary tract symptoms: Secondary | ICD-10-CM

## 2020-11-22 MED ORDER — SILDENAFIL CITRATE 50 MG PO TABS: 50.0000 mg | ORAL_TABLET | Freq: Every day | ORAL | 1 refills | Status: DC | PRN

## 2020-11-22 MED ORDER — IBUPROFEN 600 MG PO TABS: 600.0000 mg | ORAL_TABLET | Freq: Three times a day (TID) | ORAL | 0 refills | Status: DC | PRN

## 2020-11-22 MED ORDER — TIZANIDINE HCL 4 MG PO TABS
4.0000 mg | ORAL_TABLET | Freq: Four times a day (QID) | ORAL | 0 refills | Status: DC | PRN
Start: 2020-11-22 — End: 2021-07-04

## 2020-11-22 MED ORDER — PREDNISONE 20 MG PO TABS: 40.0000 mg | ORAL_TABLET | Freq: Every day | ORAL | 0 refills | Status: DC

## 2020-11-22 NOTE — Patient Instructions (Signed)
I sent

## 2020-11-22 NOTE — Assessment & Plan Note (Signed)
-  Rx. Sildenafil  -still gets AM erections and has desire -if no improvement, will consider urology consult -we discussed risks vs benefits, and he will disclose sildenafil use to any health providers/ educated to avoid nitrates for chest pain

## 2020-11-22 NOTE — Assessment & Plan Note (Signed)
Labs ordered.

## 2020-11-22 NOTE — Assessment & Plan Note (Signed)
-  Rx. Tizanidine, ibuprofen, and prednisone -if no improvement, return to clinic in 1 week

## 2020-11-22 NOTE — Progress Notes (Signed)
Acute Office Visit  Subjective:    Patient ID: [name] Potts, male    DOB: 18-Nov-1970, 50 y.o.   MRN: [id number]  Chief Complaint  Patient presents with   Back Pain    Mid back radiating towards the front, started x2 weeks ago, has recently gotten over Covid.     Back Pain  Patient is in today for mid-back pain.  He rates pain at mild or non-existent at rest, but it gets moderate/severe with movement. When he sneezes, he states he is on the floor. He hasn't taken any OTC medications for it.  Past Medical History:  Diagnosis Date   Chronic back pain    Constipation    Seasonal allergies     Past Surgical History:  Procedure Laterality Date   COLONOSCOPY  10/11/2011   Procedure: COLONOSCOPY;  Surgeon: Daneil Dolin, MD;  [facility];  Service: Endoscopy;  Laterality: N/A;  1:45 PM   COLONOSCOPY N/A 01/23/2018   Procedure: COLONOSCOPY;  Surgeon: Daneil Dolin, MD;  [facility];  Service: Endoscopy;  Laterality: N/A;  1:00   POLYPECTOMY  01/23/2018   Procedure: POLYPECTOMY;  Surgeon: Daneil Dolin, MD;  [facility];  Service: Endoscopy;;    Family History  Problem Relation Age of Onset   Colon cancer Mother 77       passed at age 72    Social History   Socioeconomic History   Marital status: Married    Spouse name: Not on file   Number of children: 1   Years of education: Not on file   Highest education level: Not on file  Occupational History   Occupation: Librarian, academic  Tobacco Use   Smoking status: Some Days    Packs/day: 0.50    Types: Cigarettes   Smokeless tobacco: Never   Tobacco comments:    using a electric cigarette  Substance and Sexual Activity   Alcohol use: No   Drug use: No   Sexual activity: Yes  Other Topics Concern   Not on file  Social History Narrative   Not on file   Social Determinants of Health   Financial Resource Strain: Not on file  Food Insecurity: Not on file  Transportation Needs:  Not on file  Physical Activity: Not on file  Stress: Not on file  Social Connections: Not on file  Intimate Partner Violence: Not on file    Outpatient Medications Prior to Visit  Medication Sig Dispense Refill   amphetamine-dextroamphetamine (ADDERALL XR) 10 MG 24 hr capsule Take 10 mg by mouth in the morning and at bedtime.     atorvastatin (LIPITOR) 40 MG tablet Take 1 tablet (40 mg total) by mouth daily. 90 tablet 1   loratadine (CLARITIN) 10 MG tablet Take 10 mg by mouth every evening.     ibuprofen (ADVIL) 600 MG tablet Take 1 tablet (600 mg total) by mouth every 8 (eight) hours as needed for headache, mild pain or moderate pain. 30 tablet 0   HYDROcodone bit-homatropine (HYCODAN) 5-1.5 MG/5ML syrup Take 5 mLs by mouth every 8 (eight) hours as needed for cough. (Patient not taking: Reported on 11/22/2020) 120 mL 0   No facility-administered medications prior to visit.    Allergies  Allergen Reactions   Other     Review of Systems  Constitutional: Negative.   Respiratory: Negative.    Cardiovascular: Negative.   Genitourinary:        ED  Musculoskeletal:  Positive for back pain.       Right mid-back pain; worse with movement and sneezing      Objective:    Physical Exam Constitutional:      Appearance: Normal appearance.  Cardiovascular:     Rate and Rhythm: Normal rate and regular rhythm.     Pulses: Normal pulses.     Heart sounds: Normal heart sounds.  Pulmonary:     Effort: Pulmonary effort is normal.     Breath sounds: Normal breath sounds.  Abdominal:     General: Abdomen is flat. Bowel sounds are normal. There is no distension.     Palpations: Abdomen is soft. There is no mass.     Tenderness: There is no abdominal tenderness. There is no guarding or rebound.     Hernia: No hernia is present.  Musculoskeletal:        General: Tenderness present.     Comments: Pain in back with lateral flexion and rotation  Neurological:     Mental Status: He is alert.     BP 126/81 (BP Location: Left Arm, Patient Position: Sitting, Cuff Size: Large)   Pulse 85   Temp 98.7 F (37.1 C) (Temporal)   Ht 6' (1.829 m)   Wt 189 lb (85.7 kg)   SpO2 96%   BMI 25.63 kg/m  Wt Readings from Last 3 Encounters:  11/22/20 189 lb (85.7 kg)  04/06/20 198 lb (89.8 kg)  03/07/20 196 lb (88.9 kg)    Health Maintenance Due  Topic Date Due   COVID-19 Vaccine (1) Never done   Pneumococcal Vaccine 55-16 Years old (1 - PCV) Never done   HIV Screening  Never done   Hepatitis C Screening  Never done   TETANUS/TDAP  Never done   Zoster Vaccines- Shingrix (1 of 2) Never done   INFLUENZA VACCINE  11/13/2020    There are no preventive care reminders to display for this patient.   No results found for: TSH Lab Results  Component Value Date   WBC 9.3 04/03/2020   HGB 15.8 04/03/2020   HCT 48.5 04/03/2020   MCV 92 04/03/2020   PLT 217 04/03/2020   Lab Results  Component Value Date   NA 141 04/03/2020   K 4.3 04/03/2020   CO2 22 04/03/2020   GLUCOSE 93 04/03/2020   BUN 14 04/03/2020   CREATININE 0.97 04/03/2020   BILITOT 0.5 04/03/2020   ALKPHOS 103 04/03/2020   AST 22 04/03/2020   ALT 41 04/03/2020   PROT 6.6 04/03/2020   ALBUMIN 4.5 04/03/2020   CALCIUM 9.3 04/03/2020   Lab Results  Component Value Date   CHOL 171 04/03/2020   Lab Results  Component Value Date   HDL 34 (L) 04/03/2020   Lab Results  Component Value Date   LDLCALC 111 (H) 04/03/2020   Lab Results  Component Value Date   TRIG 146 04/03/2020   No results found for: CHOLHDL No results found for: HGBA1C     Assessment & Plan:   Problem List Items Addressed This Visit       Musculoskeletal and Integument   Strain of muscle and tendon of back wall of thorax, initial encounter - Primary    -Rx. Tizanidine, ibuprofen, and prednisone -if no improvement, return to clinic in 1 week       Relevant Medications   ibuprofen (ADVIL) 600 MG tablet   tiZANidine (ZANAFLEX) 4 MG  tablet   predniSONE (DELTASONE) 20 MG tablet  Other   Hyperlipidemia    Labs ordered       Relevant Medications   sildenafil (VIAGRA) 50 MG tablet   Erectile dysfunction    -Rx. Sildenafil  -still gets AM erections and has desire -if no improvement, will consider urology consult -we discussed risks vs benefits, and he will disclose sildenafil use to any health providers/ educated to avoid nitrates for chest pain       Other Visit Diagnoses     Rib pain on right side       Relevant Orders   DG Ribs Unilateral Right        Meds ordered this encounter  Medications   ibuprofen (ADVIL) 600 MG tablet    Sig: Take 1 tablet (600 mg total) by mouth every 8 (eight) hours as needed for headache, mild pain or moderate pain.    Dispense:  30 tablet    Refill:  0   tiZANidine (ZANAFLEX) 4 MG tablet    Sig: Take 1 tablet (4 mg total) by mouth every 6 (six) hours as needed for muscle spasms.    Dispense:  30 tablet    Refill:  0   predniSONE (DELTASONE) 20 MG tablet    Sig: Take 2 tablets (40 mg total) by mouth daily with breakfast.    Dispense:  10 tablet    Refill:  0   sildenafil (VIAGRA) 50 MG tablet    Sig: Take 1 tablet (50 mg total) by mouth daily as needed for erectile dysfunction.    Dispense:  10 tablet    Refill:  [place], NP

## 2020-11-23 NOTE — Progress Notes (Signed)
Labs look great! Glucose was elevated, but labs weren't fasting, so that doesn't count.

## 2020-11-24 LAB — CBC WITH DIFFERENTIAL/PLATELET
Basophils Absolute: 0.1 10*3/uL (ref 0.0–0.2)
Basos: 1 %
EOS (ABSOLUTE): 0.1 10*3/uL (ref 0.0–0.4)
Eos: 1 %
Hematocrit: 40 % (ref 37.5–51.0)
Hemoglobin: 13.7 g/dL (ref 13.0–17.7)
Immature Grans (Abs): 0 10*3/uL (ref 0.0–0.1)
Immature Granulocytes: 0 %
Lymphocytes Absolute: 2.4 10*3/uL (ref 0.7–3.1)
Lymphs: 29 %
MCH: 29.8 pg (ref 26.6–33.0)
MCHC: 34.3 g/dL (ref 31.5–35.7)
MCV: 87 fL (ref 79–97)
Monocytes Absolute: 0.5 10*3/uL (ref 0.1–0.9)
Monocytes: 6 %
Neutrophils Absolute: 5.4 10*3/uL (ref 1.4–7.0)
Neutrophils: 63 %
Platelets: 216 10*3/uL (ref 150–450)
RBC: 4.6 x10E6/uL (ref 4.14–5.80)
RDW: 12.9 % (ref 11.6–15.4)
WBC: 8.5 10*3/uL (ref 3.4–10.8)

## 2020-11-24 LAB — CMP14+EGFR
ALT: 23 IU/L (ref 0–44)
AST: 16 IU/L (ref 0–40)
Albumin/Globulin Ratio: 1.9 (ref 1.2–2.2)
Albumin: 4.3 g/dL (ref 3.8–4.9)
Alkaline Phosphatase: 92 IU/L (ref 44–121)
BUN/Creatinine Ratio: 14 (ref 9–20)
BUN: 13 mg/dL (ref 6–24)
Bilirubin Total: 0.6 mg/dL (ref 0.0–1.2)
CO2: 22 mmol/L (ref 20–29)
Calcium: 9.3 mg/dL (ref 8.7–10.2)
Chloride: 104 mmol/L (ref 96–106)
Creatinine, Ser: 0.96 mg/dL (ref 0.76–1.27)
Globulin, Total: 2.3 g/dL (ref 1.5–4.5)
Glucose: 108 mg/dL — ABNORMAL HIGH (ref 65–99)
Potassium: 4 mmol/L (ref 3.5–5.2)
Sodium: 140 mmol/L (ref 134–144)
Total Protein: 6.6 g/dL (ref 6.0–8.5)
eGFR: 96 mL/min/{1.73_m2} (ref >59)

## 2020-11-24 LAB — LIPID PANEL WITH LDL/HDL RATIO
Cholesterol, Total: 143 mg/dL (ref 100–199)
HDL: 27 mg/dL — ABNORMAL LOW (ref >39)
LDL Chol Calc (NIH): 91 mg/dL (ref 0–99)
LDL/HDL Ratio: 3.4 ratio (ref 0.0–3.6)
Triglycerides: 141 mg/dL (ref 0–149)
VLDL Cholesterol Cal: 25 mg/dL (ref 5–40)

## 2020-11-24 LAB — PSA: Prostate Specific Ag, Serum: 1.1 ng/mL (ref 0.0–4.0)

## 2020-12-04 ENCOUNTER — Telehealth: Payer: Self-pay

## 2020-12-04 NOTE — Telephone Encounter (Signed)
Still having back pain and upper close to rib- ? Kidney pain.  Please call patient [phone]

## 2020-12-05 ENCOUNTER — Other Ambulatory Visit: Payer: Self-pay | Admitting: Nurse Practitioner

## 2020-12-05 DIAGNOSIS — S29012A Strain of muscle and tendon of back wall of thorax, initial encounter: Secondary | ICD-10-CM

## 2020-12-05 NOTE — Telephone Encounter (Signed)
Pt informed

## 2020-12-05 NOTE — Telephone Encounter (Signed)
I put in an ortho referral.

## 2020-12-12 ENCOUNTER — Ambulatory Visit: Payer: Commercial Managed Care - PPO | Admitting: Orthopedic Surgery

## 2020-12-19 ENCOUNTER — Ambulatory Visit: Payer: Commercial Managed Care - PPO | Admitting: Orthopedic Surgery

## 2021-01-22 NOTE — Progress Notes (Signed)
This encounter was created in error - please disregard.

## 2021-02-28 ENCOUNTER — Ambulatory Visit: Payer: Commercial Managed Care - PPO | Admitting: Nurse Practitioner

## 2021-04-02 ENCOUNTER — Ambulatory Visit: Payer: Commercial Managed Care - PPO | Admitting: Nurse Practitioner

## 2021-05-30 DIAGNOSIS — J90 Pleural effusion, not elsewhere classified: Secondary | ICD-10-CM | POA: Insufficient documentation

## 2021-06-21 ENCOUNTER — Encounter: Payer: Self-pay | Admitting: Thoracic Surgery (Cardiothoracic Vascular Surgery)

## 2021-06-21 ENCOUNTER — Other Ambulatory Visit: Payer: Self-pay | Admitting: Thoracic Surgery (Cardiothoracic Vascular Surgery)

## 2021-06-21 ENCOUNTER — Other Ambulatory Visit: Payer: Self-pay

## 2021-06-21 ENCOUNTER — Ambulatory Visit: Payer: Commercial Managed Care - PPO | Admitting: Thoracic Surgery (Cardiothoracic Vascular Surgery)

## 2021-06-21 ENCOUNTER — Ambulatory Visit
Admission: RE | Admit: 2021-06-21 | Discharge: 2021-06-21 | Disposition: A | Discharge status: Home / Self Care | Payer: Commercial Managed Care - PPO | Source: Ambulatory Visit | Attending: Thoracic Surgery (Cardiothoracic Vascular Surgery) | Admitting: Thoracic Surgery (Cardiothoracic Vascular Surgery)

## 2021-06-21 VITALS — BP 128/89 | HR 100 | Resp 20 | Ht 72.0 in | Wt 184.0 lb

## 2021-06-21 DIAGNOSIS — R200 Anesthesia of skin: Secondary | ICD-10-CM | POA: Insufficient documentation

## 2021-06-21 DIAGNOSIS — J90 Pleural effusion, not elsewhere classified: Secondary | ICD-10-CM

## 2021-06-21 DIAGNOSIS — G5600 Carpal tunnel syndrome, unspecified upper limb: Secondary | ICD-10-CM | POA: Insufficient documentation

## 2021-06-21 DIAGNOSIS — J9819 Other pulmonary collapse: Secondary | ICD-10-CM | POA: Diagnosis not present

## 2021-06-21 DIAGNOSIS — M542 Cervicalgia: Secondary | ICD-10-CM | POA: Insufficient documentation

## 2021-06-21 DIAGNOSIS — M5412 Radiculopathy, cervical region: Secondary | ICD-10-CM | POA: Insufficient documentation

## 2021-06-21 NOTE — H&P (View-Only) (Signed)
PCP is Noreene Larsson, NP (Inactive) ?Referring Provider is Gwenevere Ghazi, MD ? ?Chief Complaint  ?Patient presents with  ? Trapped Lung  ?  Surgical consult, Chest CT 05/29/21/CXR's 06/15/21 and 05/29/21([facility])/Thoracentesis x2  ? ? ?HPI: [name] Potts is sent for consultation regarding a large right pleural effusion. ? ?[name] Potts is a 51 year old man with a history of tobacco use, cervical spine surgery, and pneumonia.  He had a cervical fusion on December 22.  About a week later he developed cough and congestion and was diagnosed with bronchitis.  He is really never got better after that.  He had a persistent cough.  In February he had a chest x-ray done which showed a large right pleural effusion.  A CT of the chest was performed.  That confirmed the effusion.  There was no mass lesion evident.  There was some mild pleural thickening.  He saw Dr. Verdie Mosher.  He had thoracentesis on 2 occasions draining about 700 mL 1 time and 800 another.  He had a pneumo ex vacuo after the second thoracentesis.  He also underwent a bronchoscopy which showed no evidence of obstructing mass lesions. ? ?He continues to have right-sided chest discomfort and bilateral shoulder discomfort right greater than left.  He gets short of breath with exertion.  Persistent cough, no wheezing.  He has lost about 5 pounds over the past 3 months.  He did quit smoking prior to his neck surgery. ? ?Zubrod Score: ?At the time of surgery this patient?s most appropriate activity status/level should be described as: ?[]     0    Normal activity, no symptoms ?[x]     1    Restricted in physical strenuous activity but ambulatory, able to do out light work ?[]     2    Ambulatory and capable of self care, unable to do work activities, up and about >50 % of waking hours                              ?[]     3    Only limited self care, in bed greater than 50% of waking hours ?[]     4    Completely disabled, no self care, confined to bed or chair ?[]      5    Moribund ? ?Past Medical History:  ?Diagnosis Date  ? Chronic back pain   ? Constipation   ? Seasonal allergies   ? ? ?Past Surgical History:  ?Procedure Laterality Date  ? COLONOSCOPY  10/11/2011  ? Procedure: COLONOSCOPY;  Surgeon: Daneil Dolin, MD;  [facility];  Service: Endoscopy;  Laterality: N/A;  1:45 PM  ? COLONOSCOPY N/A 01/23/2018  ? Procedure: COLONOSCOPY;  Surgeon: Daneil Dolin, MD;  [facility];  Service: Endoscopy;  Laterality: N/A;  1:00  ? POLYPECTOMY  01/23/2018  ? Procedure: POLYPECTOMY;  Surgeon: Daneil Dolin, MD;  [facility];  Service: Endoscopy;;  ? ? ?Family History  ?Problem Relation Age of Onset  ? Colon cancer Mother 67  ?     passed at age 67  ? ? ?Social History ?Social History  ? ?Tobacco Use  ? Smoking status: Some Days  ?  Packs/day: 0.50  ?  Types: Cigarettes  ? Smokeless tobacco: Never  ? Tobacco comments:  ?  using a electric cigarette  ?Substance Use Topics  ? Alcohol use: No  ? Drug use:  No  ? ? ?Current Outpatient Medications  ?Medication Sig Dispense Refill  ? amphetamine-dextroamphetamine (ADDERALL XR) 10 MG 24 hr capsule Take 10 mg by mouth in the morning and at bedtime.    ? atorvastatin (LIPITOR) 40 MG tablet Take 1 tablet (40 mg total) by mouth daily. 90 tablet 1  ? ibuprofen (ADVIL) 600 MG tablet Take 1 tablet (600 mg total) by mouth every 8 (eight) hours as needed for headache, mild pain or moderate pain. 30 tablet 0  ? loratadine (CLARITIN) 10 MG tablet Take 10 mg by mouth every evening.    ? tiZANidine (ZANAFLEX) 4 MG tablet Take 1 tablet (4 mg total) by mouth every 6 (six) hours as needed for muscle spasms. 30 tablet 0  ? ?No current facility-administered medications for this visit.  ? ? ?Allergies  ?Allergen Reactions  ? Other   ? ? ?Review of Systems  ?Constitutional:  Positive for activity change, appetite change and unexpected weight change (Lost 5 pounds in 3 months).  ?HENT:  Negative for trouble swallowing and  voice change.   ?Eyes:  Negative for visual disturbance.  ?Respiratory:  Positive for cough and shortness of breath.   ?Cardiovascular:  Negative for chest pain and leg swelling.  ?Gastrointestinal:  Negative for abdominal distention and abdominal pain.  ?Musculoskeletal:  Positive for neck pain. Negative for arthralgias.  ?Neurological:  Positive for numbness.  ?Hematological:  Negative for adenopathy. Bruises/bleeds easily.  ?All other systems reviewed and are negative. ? ?BP 128/89   Pulse 100   Resp 20   Ht 6' (1.829 m)   Wt 184 lb (83.5 kg)   SpO2 95% Comment: RA  BMI 24.95 kg/m?  ?Physical Exam ?Vitals reviewed.  ?Constitutional:   ?   General: He is not in acute distress. ?   Appearance: Normal appearance.  ?HENT:  ?   Head: Normocephalic and atraumatic.  ?Eyes:  ?   General: No scleral icterus. ?   Extraocular Movements: Extraocular movements intact.  ?Cardiovascular:  ?   Rate and Rhythm: Normal rate and regular rhythm.  ?   Heart sounds: Normal heart sounds. No murmur heard. ?Pulmonary:  ?   Effort: Pulmonary effort is normal. No respiratory distress.  ?   Comments: Absent breath sounds right base ?Abdominal:  ?   General: There is no distension.  ?   Palpations: Abdomen is soft.  ?Lymphadenopathy:  ?   Cervical: No cervical adenopathy.  ?Skin: ?   General: Skin is warm and dry.  ?Neurological:  ?   General: No focal deficit present.  ?   Mental Status: He is alert and oriented to person, place, and time.  ?   Cranial Nerves: No cranial nerve deficit.  ?   Motor: No weakness.  ? ? ?Diagnostic Tests: ?CHEST - 2 VIEW ?  ?COMPARISON:  06/12/2021. ?  ?FINDINGS: ?Moderate to large right pleural effusion has progressed. Small right ?pneumothorax. Hydropneumothorax is also present with air-fluid ?level. The patient did have thoracentesis 06/12/2021. There is ?collapse of the right lower lobe which has progressed ?  ?Left lung remains clear. ?  ?IMPRESSION: ?Progression of right pleural effusion. Small right  pneumothorax and ?right hydronephrosis also has progressed. Thoracentesis 06/12/2021. ?Progressive right lower lobe collapse. ?  ?  ?Electronically Signed ?  By: Franchot Gallo M.D. ?  On: 06/21/2021 15:16 ?  ?I personally reviewed his chest x-ray images.  There is a large right pleural effusion. ? ?I also reviewed the CT images from  [facility].  It showed a large right pleural effusion with some pleural thickening.  No mass lesion evident.  No significant adenopathy.  Visualization limited due to large effusion. ? ?Impression: ?[name] Potts is a 51 year old man with a history of tobacco abuse, cervical spine surgery, and recent bronchitis/pneumonia.  He became ill towards the end of December.  About a week after he had had cervical spine surgery for fusion.  He developed a cough and congestion.  His symptoms never abated.  He had worsening shortness of breath.  He ultimately was found to have a large right pleural effusion. ? ?He had 2 thoracenteses.  He had a pneumo ex vacuo after the second thoracentesis.  Findings were consistent with an exudate.  Cytology was negative. ? ?Bronchoscopy showed no evidence of endobronchial lesions in the level of the subsegmental bronchi. ? ?His chest x-ray today shows a persistent large right pleural effusion. ? ?Given the size and chronicity of the effusion as well as incomplete reexpansion after thoracentesis, I think it is fruitless to continue to pursue that.  I recommended that we proceed with a right VATS for drainage of the effusion and decortication. ? ?I informed Mr. and Mrs. Sylvan of the general nature of the procedure including the need for general anesthesia, the incisions to be used, the use of drainage tubes postoperatively, the expected hospital stay, and the overall recovery.  I informed them of the indications, risks, benefits, and alternatives.  They understand the risks include, but not limited to death, MI, DVT, PE, bleeding, possible need for  transfusion, infection, prolonged air leak, cardiac arrhythmias, as well as possibility of other unstable complications. ? ?He accepts the risks and wishes to proceed. ? ?Tobacco abuse-quit smoking in December.

## 2021-06-21 NOTE — Progress Notes (Signed)
PCP is Noreene Larsson, NP (Inactive) Referring Provider is Gwenevere Ghazi, MD  Chief Complaint  Patient presents with   Trapped Lung    Surgical consult, Chest CT 05/29/21/CXR's 06/15/21 and 05/29/21([facility])/Thoracentesis x2    HPI: [name] Potts is sent for consultation regarding a large right pleural effusion.  [name] Potts is a 51 year old man with a history of tobacco use, cervical spine surgery, and pneumonia.  He had a cervical fusion on December 22.  About a week later he developed cough and congestion and was diagnosed with bronchitis.  He is really never got better after that.  He had a persistent cough.  In February he had a chest x-ray done which showed a large right pleural effusion.  A CT of the chest was performed.  That confirmed the effusion.  There was no mass lesion evident.  There was some mild pleural thickening.  He saw Dr. Verdie Mosher.  He had thoracentesis on 2 occasions draining about 700 mL 1 time and 800 another.  He had a pneumo ex vacuo after the second thoracentesis.  He also underwent a bronchoscopy which showed no evidence of obstructing mass lesions.  He continues to have right-sided chest discomfort and bilateral shoulder discomfort right greater than left.  He gets short of breath with exertion.  Persistent cough, no wheezing.  He has lost about 5 pounds over the past 3 months.  He did quit smoking prior to his neck surgery.  Zubrod Score: At the time of surgery this patients most appropriate activity status/level should be described as: []     0    Normal activity, no symptoms [x]     1    Restricted in physical strenuous activity but ambulatory, able to do out light work []     2    Ambulatory and capable of self care, unable to do work activities, up and about >50 % of waking hours                              []     3    Only limited self care, in bed greater than 50% of waking hours []     4    Completely disabled, no self care, confined to bed or chair []      5    Moribund  Past Medical History:  Diagnosis Date   Chronic back pain    Constipation    Seasonal allergies     Past Surgical History:  Procedure Laterality Date   COLONOSCOPY  10/11/2011   Procedure: COLONOSCOPY;  Surgeon: Daneil Dolin, MD;  [facility];  Service: Endoscopy;  Laterality: N/A;  1:45 PM   COLONOSCOPY N/A 01/23/2018   Procedure: COLONOSCOPY;  Surgeon: Daneil Dolin, MD;  [facility];  Service: Endoscopy;  Laterality: N/A;  1:00   POLYPECTOMY  01/23/2018   Procedure: POLYPECTOMY;  Surgeon: Daneil Dolin, MD;  [facility];  Service: Endoscopy;;    Family History  Problem Relation Age of Onset   Colon cancer Mother 76       passed at age 87    Social History Social History   Tobacco Use   Smoking status: Some Days    Packs/day: 0.50    Types: Cigarettes   Smokeless tobacco: Never   Tobacco comments:    using a electric cigarette  Substance Use Topics   Alcohol use: No   Drug use:  No    Current Outpatient Medications  Medication Sig Dispense Refill   amphetamine-dextroamphetamine (ADDERALL XR) 10 MG 24 hr capsule Take 10 mg by mouth in the morning and at bedtime.     atorvastatin (LIPITOR) 40 MG tablet Take 1 tablet (40 mg total) by mouth daily. 90 tablet 1   ibuprofen (ADVIL) 600 MG tablet Take 1 tablet (600 mg total) by mouth every 8 (eight) hours as needed for headache, mild pain or moderate pain. 30 tablet 0   loratadine (CLARITIN) 10 MG tablet Take 10 mg by mouth every evening.     tiZANidine (ZANAFLEX) 4 MG tablet Take 1 tablet (4 mg total) by mouth every 6 (six) hours as needed for muscle spasms. 30 tablet 0   No current facility-administered medications for this visit.    Allergies  Allergen Reactions   Other     Review of Systems  Constitutional:  Positive for activity change, appetite change and unexpected weight change (Lost 5 pounds in 3 months).  HENT:  Negative for trouble swallowing and  voice change.   Eyes:  Negative for visual disturbance.  Respiratory:  Positive for cough and shortness of breath.   Cardiovascular:  Negative for chest pain and leg swelling.  Gastrointestinal:  Negative for abdominal distention and abdominal pain.  Musculoskeletal:  Positive for neck pain. Negative for arthralgias.  Neurological:  Positive for numbness.  Hematological:  Negative for adenopathy. Bruises/bleeds easily.  All other systems reviewed and are negative.  BP 128/89    Pulse 100    Resp 20    Ht 6' (1.829 m)    Wt 184 lb (83.5 kg)    SpO2 95% Comment: RA   BMI 24.95 kg/m  Physical Exam Vitals reviewed.  Constitutional:      General: He is not in acute distress.    Appearance: Normal appearance.  HENT:     Head: Normocephalic and atraumatic.  Eyes:     General: No scleral icterus.    Extraocular Movements: Extraocular movements intact.  Cardiovascular:     Rate and Rhythm: Normal rate and regular rhythm.     Heart sounds: Normal heart sounds. No murmur heard. Pulmonary:     Effort: Pulmonary effort is normal. No respiratory distress.     Comments: Absent breath sounds right base Abdominal:     General: There is no distension.     Palpations: Abdomen is soft.  Lymphadenopathy:     Cervical: No cervical adenopathy.  Skin:    General: Skin is warm and dry.  Neurological:     General: No focal deficit present.     Mental Status: He is alert and oriented to person, place, and time.     Cranial Nerves: No cranial nerve deficit.     Motor: No weakness.    Diagnostic Tests: CHEST - 2 VIEW   COMPARISON:  06/12/2021.   FINDINGS: Moderate to large right pleural effusion has progressed. Small right pneumothorax. Hydropneumothorax is also present with air-fluid level. The patient did have thoracentesis 06/12/2021. There is collapse of the right lower lobe which has progressed   Left lung remains clear.   IMPRESSION: Progression of right pleural effusion. Small right  pneumothorax and right hydronephrosis also has progressed. Thoracentesis 06/12/2021. Progressive right lower lobe collapse.     Electronically Signed   By: Franchot Gallo M.D.   On: 06/21/2021 15:16   I personally reviewed his chest x-ray images.  There is a large right pleural effusion.  I  also reviewed the CT images from [facility]  It showed a large right pleural effusion with some pleural thickening.  No mass lesion evident.  No significant adenopathy.  Visualization limited due to large effusion.  Impression: [name] Potts is a 51 year old man with a history of tobacco abuse, cervical spine surgery, and recent bronchitis/pneumonia.  He became ill towards the end of December.  About a week after he had had cervical spine surgery for fusion.  He developed a cough and congestion.  His symptoms never abated.  He had worsening shortness of breath.  He ultimately was found to have a large right pleural effusion.  He had 2 thoracenteses.  He had a pneumo ex vacuo after the second thoracentesis.  Findings were consistent with an exudate.  Cytology was negative.  Bronchoscopy showed no evidence of endobronchial lesions in the level of the subsegmental bronchi.  His chest x-ray today shows a persistent large right pleural effusion.  Given the size and chronicity of the effusion as well as incomplete reexpansion after thoracentesis, I think it is fruitless to continue to pursue that.  I recommended that we proceed with a right VATS for drainage of the effusion and decortication.  I informed Mr. and Mrs. Fabrizio of the general nature of the procedure including the need for general anesthesia, the incisions to be used, the use of drainage tubes postoperatively, the expected hospital stay, and the overall recovery.  I informed them of the indications, risks, benefits, and alternatives.  They understand the risks include, but not limited to death, MI, DVT, PE, bleeding, possible need for  transfusion, infection, prolonged air leak, cardiac arrhythmias, as well as possibility of other unstable complications.  He accepts the risks and wishes to proceed.  Tobacco abuse-quit smoking in December.  I congratulated him for that.  Plan: Right VATS for drainage of pleural effusion and decortication on Wednesday, 06/27/2021  I spent over 45 minutes in review of records, images, and in consultation with Mr. Junkin today Melrose Nakayama, MD [facility] [phone]

## 2021-06-22 ENCOUNTER — Other Ambulatory Visit: Payer: Self-pay | Admitting: *Deleted

## 2021-06-22 DIAGNOSIS — J90 Pleural effusion, not elsewhere classified: Secondary | ICD-10-CM

## 2021-06-22 NOTE — Pre-Procedure Instructions (Signed)
Surgical Instructions ? ? ? Your procedure is scheduled on Wednesday 06/27/21. ? ? Report to [facility] Main Entrance "A" at 06:30 A.M., then check in with the Admitting office. ? Call this number if you have problems the morning of surgery: ? [phone] ? ? If you have any questions prior to your surgery date call [phone]: Open Monday-Friday 8am-4pm ? ? ? Remember: ? Do not eat or drink after midnight the night before your surgery ?  ? Take these medicines the morning of surgery with A SIP OF WATER:  ? atorvastatin (LIPITOR) ? ? Take these medicines if needed:  ? HYDROcodone-acetaminophen (NORCO/VICODIN) ? methocarbamol (ROBAXIN) ? ? ?As of today, STOP taking any Aspirin (unless otherwise instructed by your surgeon) Aleve, Naproxen, Ibuprofen, Motrin, Advil, Goody's, BC's, all herbal medications, fish oil, and all vitamins. ? ?         ?Do not wear jewelry or makeup ?Do not wear lotions, powders, perfumes/colognes, or deodorant. ?Do not shave 48 hours prior to surgery.  Men may shave face and neck. ?Do not bring valuables to the hospital. ?Do not wear nail polish, gel polish, artificial nails, or any other type of covering on natural nails (fingers and toes) ?If you have artificial nails or gel coating that need to be removed by a nail salon, please have this removed prior to surgery. Artificial nails or gel coating may interfere with anesthesia's ability to adequately monitor your vital signs. ? ?[facility] is not responsible for any belongings or valuables. .  ? ?Do NOT Smoke (Tobacco/Vaping)  24 hours prior to your procedure ? ?If you use a CPAP at night, you may bring your mask for your overnight stay. ?  ?Contacts, glasses, hearing aids, dentures or partials may not be worn into surgery, please bring cases for these belongings ?  ?For patients admitted to the hospital, discharge time will be determined by your treatment team. ?  ?Patients discharged the day of surgery will not be allowed to drive home,  and someone needs to stay with them for 24 hours. ? ?NO VISITORS WILL BE ALLOWED IN PRE-OP WHERE PATIENTS ARE PREPPED FOR SURGERY.  ONLY 1 SUPPORT PERSON MAY BE PRESENT IN THE WAITING ROOM WHILE YOU ARE IN SURGERY.  IF YOU ARE TO BE ADMITTED, ONCE YOU ARE IN YOUR ROOM YOU WILL BE ALLOWED TWO (2) VISITORS. 1 (ONE) VISITOR MAY STAY OVERNIGHT BUT MUST ARRIVE TO THE ROOM BY 8pm.  Minor children may have two parents present. Special consideration for safety and communication needs will be reviewed on a case by case basis. ? ?Special instructions:   ? ?Oral Hygiene is also important to reduce your risk of infection.  Remember - BRUSH YOUR TEETH THE MORNING OF SURGERY WITH YOUR REGULAR TOOTHPASTE ? ? ?[facility]- Preparing For Surgery ? ?Before surgery, you can play an important role. Because skin is not sterile, your skin needs to be as free of germs as possible. You can reduce the number of germs on your skin by washing with CHG (chlorahexidine gluconate) Soap before surgery.  CHG is an antiseptic cleaner which kills germs and bonds with the skin to continue killing germs even after washing.   ? ? ?Please do not use if you have an allergy to CHG or antibacterial soaps. If your skin becomes reddened/irritated stop using the CHG.  ?Do not shave (including legs and underarms) for at least 48 hours prior to first CHG shower. It is OK to shave your face. ? ?  Please follow these instructions carefully. ?  ? ? Shower the NIGHT BEFORE SURGERY and the MORNING OF SURGERY with CHG Soap.  ? If you chose to wash your hair, wash your hair first as usual with your normal shampoo. After you shampoo, rinse your hair and body thoroughly to remove the shampoo.  Then [employer] and genitals (private parts) with your normal soap and rinse thoroughly to remove soap. ? ?After that Use CHG Soap as you would any other liquid soap. You can apply CHG directly to the skin and wash gently with a scrungie or a clean washcloth.  ? ?Apply the CHG Soap  to your body ONLY FROM THE NECK DOWN.  Do not use on open wounds or open sores. Avoid contact with your eyes, ears, mouth and genitals (private parts). Wash Face and genitals (private parts)  with your normal soap.  ? ?Wash thoroughly, paying special attention to the area where your surgery will be performed. ? ?Thoroughly rinse your body with warm water from the neck down. ? ?DO NOT shower/wash with your normal soap after using and rinsing off the CHG Soap. ? ?Pat yourself dry with a CLEAN TOWEL. ? ?Wear CLEAN PAJAMAS to bed the night before surgery ? ?Place CLEAN SHEETS on your bed the night before your surgery ? ?DO NOT SLEEP WITH PETS. ? ? ?Day of Surgery: ? ?Take a shower with CHG soap. ?Wear Clean/Comfortable clothing the morning of surgery ?Do not apply any deodorants/lotions.   ?Remember to brush your teeth WITH YOUR REGULAR TOOTHPASTE. ? ? ? ?COVID testing ? ?If you are going to stay overnight or be admitted after your procedure/surgery and require a pre-op COVID test, please follow these instructions after your COVID test  ? ?You are not required to quarantine however you are required to wear a well-fitting mask when you are out and around people not in your household.  If your mask becomes wet or soiled, replace with a new one. ? ?Wash your hands often with soap and water for 20 seconds or clean your hands with an alcohol-based hand sanitizer that contains at least 60% alcohol. ? ?Do not share personal items. ? ?Notify your provider: ?if you are in close contact with someone who has COVID  ?or if you develop a fever of 100.4 or greater, sneezing, cough, sore throat, shortness of breath or body aches. ? ?  ?Please read over the following fact sheets that you were given.  ? ?

## 2021-06-25 ENCOUNTER — Encounter (HOSPITAL_COMMUNITY)
Admission: RE | Admit: 2021-06-25 | Discharge: 2021-06-25 | Disposition: A | Discharge status: Home / Self Care | Payer: Commercial Managed Care - PPO | Source: Ambulatory Visit | Attending: Thoracic Surgery (Cardiothoracic Vascular Surgery) | Admitting: Thoracic Surgery (Cardiothoracic Vascular Surgery)

## 2021-06-25 ENCOUNTER — Other Ambulatory Visit: Payer: Self-pay

## 2021-06-25 ENCOUNTER — Encounter (HOSPITAL_COMMUNITY): Payer: Self-pay

## 2021-06-25 ENCOUNTER — Ambulatory Visit (HOSPITAL_COMMUNITY)
Admission: RE | Admit: 2021-06-25 | Discharge: 2021-06-25 | Disposition: A | Discharge status: Home / Self Care | Payer: Commercial Managed Care - PPO | Source: Ambulatory Visit | Attending: Thoracic Surgery (Cardiothoracic Vascular Surgery) | Admitting: Thoracic Surgery (Cardiothoracic Vascular Surgery)

## 2021-06-25 ENCOUNTER — Other Ambulatory Visit: Payer: Self-pay | Admitting: Thoracic Surgery (Cardiothoracic Vascular Surgery)

## 2021-06-25 VITALS — BP 130/87 | HR 88 | Temp 97.8°F | Resp 18 | Ht 72.0 in | Wt 182.0 lb

## 2021-06-25 DIAGNOSIS — J90 Pleural effusion, not elsewhere classified: Secondary | ICD-10-CM | POA: Insufficient documentation

## 2021-06-25 DIAGNOSIS — Z01818 Encounter for other preprocedural examination: Secondary | ICD-10-CM

## 2021-06-25 DIAGNOSIS — Z20822 Contact with and (suspected) exposure to covid-19: Secondary | ICD-10-CM | POA: Insufficient documentation

## 2021-06-25 HISTORY — DX: Dyspnea, unspecified: R06.00

## 2021-06-25 HISTORY — DX: Pneumonia, unspecified organism: J18.9

## 2021-06-25 HISTORY — DX: Attention-deficit hyperactivity disorder, unspecified type: F90.9

## 2021-06-25 LAB — COMPREHENSIVE METABOLIC PANEL
ALT: 26 U/L (ref 0–44)
AST: 20 U/L (ref 15–41)
Albumin: 3.8 g/dL (ref 3.5–5.0)
Alkaline Phosphatase: 119 U/L (ref 38–126)
Anion gap: 12 (ref 5–15)
BUN: 11 mg/dL (ref 6–20)
CO2: 21 mmol/L — ABNORMAL LOW (ref 22–32)
Calcium: 9.5 mg/dL (ref 8.9–10.3)
Chloride: 103 mmol/L (ref 98–111)
Creatinine, Ser: 0.88 mg/dL (ref 0.61–1.24)
GFR, Estimated: >60 mL/min (ref >60)
Glucose, Bld: 104 mg/dL — ABNORMAL HIGH (ref 70–99)
Potassium: 4.1 mmol/L (ref 3.5–5.1)
Sodium: 136 mmol/L (ref 135–145)
Total Bilirubin: 0.9 mg/dL (ref 0.3–1.2)
Total Protein: 7.3 g/dL (ref 6.5–8.1)

## 2021-06-25 LAB — CBC
HCT: 40.8 % (ref 39.0–52.0)
Hemoglobin: 13.8 g/dL (ref 13.0–17.0)
MCH: 29.2 pg (ref 26.0–34.0)
MCHC: 33.8 g/dL (ref 30.0–36.0)
MCV: 86.4 fL (ref 80.0–100.0)
Platelets: 295 10*3/uL (ref 150–400)
RBC: 4.72 MIL/uL (ref 4.22–5.81)
RDW: 13.4 % (ref 11.5–15.5)
WBC: 12.2 10*3/uL — ABNORMAL HIGH (ref 4.0–10.5)
nRBC: 0 % (ref 0.0–0.2)

## 2021-06-25 LAB — URINALYSIS, ROUTINE W REFLEX MICROSCOPIC
Bacteria, UA: NONE SEEN
Bilirubin Urine: NEGATIVE
Glucose, UA: NEGATIVE mg/dL
Ketones, ur: NEGATIVE mg/dL
Leukocytes,Ua: NEGATIVE
Nitrite: NEGATIVE
Protein, ur: NEGATIVE mg/dL
Specific Gravity, Urine: 1.017 (ref 1.005–1.030)
pH: 5 (ref 5.0–8.0)

## 2021-06-25 LAB — PROTIME-INR
INR: 1.1 (ref 0.8–1.2)
Prothrombin Time: 13.7 seconds (ref 11.4–15.2)

## 2021-06-25 LAB — BLOOD GAS, ARTERIAL
Acid-base deficit: 0.1 mmol/L (ref 0.0–2.0)
Bicarbonate: 23.9 mmol/L (ref 20.0–28.0)
Drawn by: 60286
O2 Saturation: 98.5 %
Patient temperature: 37
pCO2 arterial: 36 mmHg (ref 32–48)
pH, Arterial: 7.43 (ref 7.35–7.45)
pO2, Arterial: 96 mmHg (ref 83–108)

## 2021-06-25 LAB — TYPE AND SCREEN
ABO/RH(D): O POS
Antibody Screen: NEGATIVE

## 2021-06-25 LAB — APTT: aPTT: 32 seconds (ref 24–36)

## 2021-06-25 LAB — SARS CORONAVIRUS 2 (TAT 6-24 HRS): SARS Coronavirus 2: NEGATIVE

## 2021-06-25 LAB — SURGICAL PCR SCREEN
MRSA, PCR: NEGATIVE
Staphylococcus aureus: NEGATIVE

## 2021-06-25 MED ORDER — ALPRAZOLAM 0.25 MG PO TABS
0.2500 mg | ORAL_TABLET | Freq: Three times a day (TID) | ORAL | 0 refills | Status: DC | PRN
Start: 2021-06-25 — End: 2021-07-06

## 2021-06-25 NOTE — Progress Notes (Signed)
Prescription for Xanax placed ? ?Revonda Standard Roxan Hockey, MD ?[facility] ?[phone] ? ?

## 2021-06-25 NOTE — Progress Notes (Addendum)
PCP - [facility]  ?Cardiologist - pt denies cardiologist or any cardiac issues ? ?PPM/ICD - pt denies ? ?Chest x-ray - 06/25/2021  ?EKG - 06/12/21- Requested from [place] from Dr. Gwenevere Ghazi ?Stress Test - pt reports having a stress test a few years ago, but states he does not know who did this test ?Sleep Study - denies ? ? ?COVID TEST- 06/25/2021 performed in PAT ? ? ?Anesthesia review: Follow up requested EKG tracing ? ?Patient denies shortness of breath, fever, cough at PAT appointment- pt states he has ache to right lung area. Pt states he has shortness of breath at times with exertion. Pt states this is normal for him currently, and denies any other chest pain or shortness of breath at rest.  ? ? ?All instructions explained to the patient, with a verbal understanding of the material. Patient agrees to go over the instructions while at home for a better understanding. Patient also instructed to self quarantine after being tested for COVID-19. The opportunity to ask questions was provided. ? ? ?

## 2021-06-26 NOTE — Anesthesia Preprocedure Evaluation (Addendum)
Anesthesia Evaluation  ?Patient identified by MRN, date of birth, ID band ?Patient awake ? ? ? ?Reviewed: ?Allergy & Precautions, NPO status , Patient's Chart, lab work & pertinent test results ? ?History of Anesthesia Complications ?Negative for: history of anesthetic complications ? ?Airway ?Mallampati: II ? ?TM Distance: >3 FB ?Neck ROM: Full ? ? ? Dental ? ?(+) Dental Advisory Given ?  ?Pulmonary ?shortness of breath, pneumonia, unresolved, former smoker,  ?06/25/2021 SARS coronavirus NEG ?06/26/2021 CXR: small loculated R PTX, large R pleural effusion  ?  ?breath sounds clear to auscultation ? ? ? ? ? ? Cardiovascular ?(-) anginanegative cardio ROS ? ? ?Rhythm:Regular Rate:Normal ? ? ?  ?Neuro/Psych ?PSYCHIATRIC DISORDERS (ADHD) Recent Cervical fusion ?  ? GI/Hepatic ?negative GI ROS, Neg liver ROS,   ?Endo/Other  ?negative endocrine ROS ? Renal/GU ?negative Renal ROS  ? ?  ?Musculoskeletal ? ? Abdominal ?  ?Peds ? Hematology ?negative hematology ROS ?(+)   ?Anesthesia Other Findings ? ? Reproductive/Obstetrics ? ?  ? ? ? ? ? ? ? ? ? ? ? ? ? ?  ?  ? ? ? ? ? ? ? ?Anesthesia Physical ?Anesthesia Plan ? ?ASA: 3 ? ?Anesthesia Plan: General  ? ?Post-op Pain Management: Tylenol PO (pre-op)*  ? ?Induction: Intravenous ? ?PONV Risk Score and Plan: 2 and Ondansetron and Dexamethasone ? ?Airway Management Planned: Oral ETT and Double Lumen EBT ? ?Additional Equipment:  ? ?Intra-op Plan:  ? ?Post-operative Plan: Extubation in OR ? ?Informed Consent: I have reviewed the patients History and Physical, chart, labs and discussed the procedure including the risks, benefits and alternatives for the proposed anesthesia with the patient or authorized representative who has indicated his/her understanding and acceptance.  ? ? ? ?Dental advisory given ? ?Plan Discussed with:  ? ?Anesthesia Plan Comments: (Plan routine monitors, GETA with DLT and ClearSight beat-beat BP monitoring)   ? ? ? ? ? ?Anesthesia Quick Evaluation ? ?

## 2021-06-27 ENCOUNTER — Inpatient Hospital Stay (HOSPITAL_COMMUNITY)
Admission: RE | Admit: 2021-06-27 | Discharge: 2021-07-04 | DRG: 164 | Disposition: A | Discharge status: Home / Self Care | Payer: Commercial Managed Care - PPO | Attending: Thoracic Surgery (Cardiothoracic Vascular Surgery) | Admitting: Thoracic Surgery (Cardiothoracic Vascular Surgery)

## 2021-06-27 ENCOUNTER — Other Ambulatory Visit: Payer: Self-pay

## 2021-06-27 ENCOUNTER — Inpatient Hospital Stay (HOSPITAL_COMMUNITY): Payer: Commercial Managed Care - PPO | Admitting: Physician Assistant

## 2021-06-27 ENCOUNTER — Encounter (HOSPITAL_COMMUNITY)
Admission: RE | Disposition: A | Discharge status: Home / Self Care | Payer: Self-pay | Source: Home / Self Care | Attending: Thoracic Surgery (Cardiothoracic Vascular Surgery)

## 2021-06-27 ENCOUNTER — Inpatient Hospital Stay (HOSPITAL_COMMUNITY): Payer: Commercial Managed Care - PPO

## 2021-06-27 ENCOUNTER — Encounter (HOSPITAL_COMMUNITY): Payer: Self-pay | Admitting: Thoracic Surgery (Cardiothoracic Vascular Surgery)

## 2021-06-27 ENCOUNTER — Inpatient Hospital Stay (HOSPITAL_COMMUNITY): Payer: Commercial Managed Care - PPO | Admitting: Certified Registered Nurse Anesthetist

## 2021-06-27 DIAGNOSIS — J90 Pleural effusion, not elsewhere classified: Secondary | ICD-10-CM | POA: Diagnosis present

## 2021-06-27 DIAGNOSIS — J189 Pneumonia, unspecified organism: Secondary | ICD-10-CM | POA: Diagnosis not present

## 2021-06-27 DIAGNOSIS — Z981 Arthrodesis status: Secondary | ICD-10-CM

## 2021-06-27 DIAGNOSIS — J95812 Postprocedural air leak: Secondary | ICD-10-CM | POA: Diagnosis not present

## 2021-06-27 DIAGNOSIS — J910 Malignant pleural effusion: Secondary | ICD-10-CM | POA: Diagnosis present

## 2021-06-27 DIAGNOSIS — M549 Dorsalgia, unspecified: Secondary | ICD-10-CM | POA: Diagnosis present

## 2021-06-27 DIAGNOSIS — F909 Attention-deficit hyperactivity disorder, unspecified type: Secondary | ICD-10-CM | POA: Diagnosis present

## 2021-06-27 DIAGNOSIS — Z8701 Personal history of pneumonia (recurrent): Secondary | ICD-10-CM | POA: Diagnosis not present

## 2021-06-27 DIAGNOSIS — G8929 Other chronic pain: Secondary | ICD-10-CM | POA: Diagnosis present

## 2021-06-27 DIAGNOSIS — Z800 Family history of malignant neoplasm of digestive organs: Secondary | ICD-10-CM

## 2021-06-27 DIAGNOSIS — Z09 Encounter for follow-up examination after completed treatment for conditions other than malignant neoplasm: Secondary | ICD-10-CM

## 2021-06-27 DIAGNOSIS — Z87891 Personal history of nicotine dependence: Secondary | ICD-10-CM | POA: Diagnosis not present

## 2021-06-27 DIAGNOSIS — Z79899 Other long term (current) drug therapy: Secondary | ICD-10-CM

## 2021-06-27 DIAGNOSIS — Y838 Other surgical procedures as the cause of abnormal reaction of the patient, or of later complication, without mention of misadventure at the time of the procedure: Secondary | ICD-10-CM | POA: Diagnosis not present

## 2021-06-27 DIAGNOSIS — C384 Malignant neoplasm of pleura: Secondary | ICD-10-CM | POA: Diagnosis present

## 2021-06-27 DIAGNOSIS — Z20822 Contact with and (suspected) exposure to covid-19: Secondary | ICD-10-CM | POA: Diagnosis present

## 2021-06-27 DIAGNOSIS — C3490 Malignant neoplasm of unspecified part of unspecified bronchus or lung: Secondary | ICD-10-CM

## 2021-06-27 DIAGNOSIS — J948 Other specified pleural conditions: Secondary | ICD-10-CM | POA: Diagnosis present

## 2021-06-27 HISTORY — PX: VIDEO ASSISTED THORACOSCOPY (VATS)/DECORTICATION: SHX6171

## 2021-06-27 LAB — BODY FLUID CELL COUNT WITH DIFFERENTIAL
Eos, Fluid: 0 %
Lymphs, Fluid: 95 %
Monocyte-Macrophage-Serous Fluid: 5 % — ABNORMAL LOW (ref 50–90)
Neutrophil Count, Fluid: 0 % (ref 0–25)
Total Nucleated Cell Count, Fluid: 1360 cu mm — ABNORMAL HIGH (ref 0–1000)

## 2021-06-27 LAB — ABO/RH: ABO/RH(D): O POS

## 2021-06-27 SURGERY — VIDEO ASSISTED THORACOSCOPY (VATS)/DECORTICATION
Anesthesia: General | Site: Chest | Laterality: Right

## 2021-06-27 MED ORDER — ENOXAPARIN SODIUM 40 MG/0.4ML IJ SOSY
40.0000 mg | PREFILLED_SYRINGE | Freq: Every day | INTRAMUSCULAR | Status: DC
  Administered 2021-06-28 – 2021-07-03 (×3): 40 mg via SUBCUTANEOUS
  Filled 2021-06-27 (×6): qty 0.4

## 2021-06-27 MED ORDER — HYDROMORPHONE HCL 1 MG/ML IJ SOLN
INTRAMUSCULAR | Status: AC
  Filled 2021-06-27: qty 1

## 2021-06-27 MED ORDER — ROCURONIUM BROMIDE 10 MG/ML (PF) SYRINGE
PREFILLED_SYRINGE | INTRAVENOUS | Status: AC
  Filled 2021-06-27: qty 10

## 2021-06-27 MED ORDER — TRAMADOL HCL 50 MG PO TABS
50.0000 mg | ORAL_TABLET | Freq: Four times a day (QID) | ORAL | Status: DC | PRN
  Administered 2021-06-27 – 2021-07-04 (×12): 100 mg via ORAL
  Filled 2021-06-27 (×14): qty 2

## 2021-06-27 MED ORDER — ACETAMINOPHEN 160 MG/5ML PO SOLN
1000.0000 mg | Freq: Four times a day (QID) | ORAL | Status: AC
Start: 2021-06-27 — End: 2021-07-02

## 2021-06-27 MED ORDER — ONDANSETRON HCL 4 MG/2ML IJ SOLN
INTRAMUSCULAR | Status: AC
  Filled 2021-06-27: qty 2

## 2021-06-27 MED ORDER — LIDOCAINE 2% (20 MG/ML) 5 ML SYRINGE
INTRAMUSCULAR | Status: DC | PRN
  Administered 2021-06-27: 70 mg via INTRAVENOUS

## 2021-06-27 MED ORDER — ATORVASTATIN CALCIUM 40 MG PO TABS
40.0000 mg | ORAL_TABLET | Freq: Every day | ORAL | Status: DC
  Administered 2021-06-28 – 2021-07-04 (×7): 40 mg via ORAL
  Filled 2021-06-27 (×7): qty 1

## 2021-06-27 MED ORDER — ROCURONIUM BROMIDE 10 MG/ML (PF) SYRINGE
PREFILLED_SYRINGE | INTRAVENOUS | Status: DC | PRN
  Administered 2021-06-27: 20 mg via INTRAVENOUS
  Administered 2021-06-27: 10 mg via INTRAVENOUS
  Administered 2021-06-27: 70 mg via INTRAVENOUS

## 2021-06-27 MED ORDER — ACETAMINOPHEN 500 MG PO TABS
1000.0000 mg | ORAL_TABLET | Freq: Four times a day (QID) | ORAL | Status: AC
Start: 2021-06-27 — End: 2021-07-02
  Administered 2021-06-27 – 2021-07-02 (×18): 1000 mg via ORAL
  Filled 2021-06-27 (×21): qty 2

## 2021-06-27 MED ORDER — KETOROLAC TROMETHAMINE 15 MG/ML IJ SOLN
15.0000 mg | Freq: Four times a day (QID) | INTRAMUSCULAR | Status: DC
  Administered 2021-06-27 – 2021-06-28 (×3): 15 mg via INTRAVENOUS
  Filled 2021-06-27 (×3): qty 1

## 2021-06-27 MED ORDER — SODIUM CHLORIDE 0.9 % IV SOLN: INTRAVENOUS | Status: DC

## 2021-06-27 MED ORDER — BUPIVACAINE LIPOSOME 1.3 % IJ SUSP
INTRAMUSCULAR | Status: AC
  Filled 2021-06-27: qty 20

## 2021-06-27 MED ORDER — FENTANYL CITRATE (PF) 250 MCG/5ML IJ SOLN
INTRAMUSCULAR | Status: AC
  Filled 2021-06-27: qty 5

## 2021-06-27 MED ORDER — MEPERIDINE HCL 25 MG/ML IJ SOLN: 6.2500 mg | INTRAMUSCULAR | Status: DC | PRN

## 2021-06-27 MED ORDER — MIDAZOLAM HCL 2 MG/2ML IJ SOLN
INTRAMUSCULAR | Status: AC
  Filled 2021-06-27: qty 2

## 2021-06-27 MED ORDER — HYDROMORPHONE HCL 1 MG/ML IJ SOLN
0.2500 mg | INTRAMUSCULAR | Status: DC | PRN
  Administered 2021-06-27 (×3): 0.5 mg via INTRAVENOUS

## 2021-06-27 MED ORDER — MIDAZOLAM HCL 2 MG/2ML IJ SOLN
INTRAMUSCULAR | Status: DC | PRN
Start: 2021-06-27 — End: 2021-06-27
  Administered 2021-06-27: 2 mg via INTRAVENOUS

## 2021-06-27 MED ORDER — CEFAZOLIN SODIUM-DEXTROSE 2-4 GM/100ML-% IV SOLN
2.0000 g | Freq: Three times a day (TID) | INTRAVENOUS | Status: AC
  Administered 2021-06-27 (×2): 2 g via INTRAVENOUS
  Filled 2021-06-27 (×2): qty 100

## 2021-06-27 MED ORDER — BUPIVACAINE HCL (PF) 0.5 % IJ SOLN
INTRAMUSCULAR | Status: AC
  Filled 2021-06-27: qty 30

## 2021-06-27 MED ORDER — HEMOSTATIC AGENTS (NO CHARGE) OPTIME
TOPICAL | Status: DC | PRN
  Administered 2021-06-27: 1 via TOPICAL

## 2021-06-27 MED ORDER — OXYCODONE HCL 5 MG PO TABS: 5.0000 mg | ORAL_TABLET | Freq: Once | ORAL | Status: DC | PRN

## 2021-06-27 MED ORDER — ONDANSETRON HCL 4 MG/2ML IJ SOLN
INTRAMUSCULAR | Status: DC | PRN
  Administered 2021-06-27: 4 mg via INTRAVENOUS

## 2021-06-27 MED ORDER — PROPOFOL 10 MG/ML IV BOLUS
INTRAVENOUS | Status: DC | PRN
  Administered 2021-06-27: 200 mg via INTRAVENOUS

## 2021-06-27 MED ORDER — ALBUMIN HUMAN 5 % IV SOLN: INTRAVENOUS | Status: DC | PRN

## 2021-06-27 MED ORDER — SUGAMMADEX SODIUM 200 MG/2ML IV SOLN
INTRAVENOUS | Status: DC | PRN
Start: 2021-06-27 — End: 2021-06-27
  Administered 2021-06-27: 200 mg via INTRAVENOUS

## 2021-06-27 MED ORDER — OXYCODONE HCL 5 MG PO TABS
ORAL_TABLET | ORAL | Status: AC
  Filled 2021-06-27: qty 2

## 2021-06-27 MED ORDER — SENNOSIDES-DOCUSATE SODIUM 8.6-50 MG PO TABS
1.0000 | ORAL_TABLET | Freq: Every day | ORAL | Status: DC
  Administered 2021-06-30 – 2021-07-03 (×4): 1 via ORAL
  Filled 2021-06-27 (×5): qty 1

## 2021-06-27 MED ORDER — MIDAZOLAM HCL 2 MG/2ML IJ SOLN: 0.5000 mg | Freq: Once | INTRAMUSCULAR | Status: DC | PRN

## 2021-06-27 MED ORDER — OXYCODONE HCL 5 MG/5ML PO SOLN: 5.0000 mg | Freq: Once | ORAL | Status: DC | PRN

## 2021-06-27 MED ORDER — PANTOPRAZOLE SODIUM 40 MG PO TBEC
40.0000 mg | DELAYED_RELEASE_TABLET | Freq: Every day | ORAL | Status: DC
  Administered 2021-06-28 – 2021-07-04 (×7): 40 mg via ORAL
  Filled 2021-06-27 (×7): qty 1

## 2021-06-27 MED ORDER — ALBUTEROL SULFATE (2.5 MG/3ML) 0.083% IN NEBU
2.5000 mg | INHALATION_SOLUTION | RESPIRATORY_TRACT | Status: DC
  Administered 2021-06-27 – 2021-06-28 (×4): 2.5 mg via RESPIRATORY_TRACT
  Filled 2021-06-27 (×4): qty 3

## 2021-06-27 MED ORDER — CEFAZOLIN SODIUM-DEXTROSE 2-4 GM/100ML-% IV SOLN
2.0000 g | INTRAVENOUS | Status: AC
  Administered 2021-06-27: 2 g via INTRAVENOUS
  Filled 2021-06-27: qty 100

## 2021-06-27 MED ORDER — FENTANYL CITRATE PF 50 MCG/ML IJ SOSY
25.0000 ug | PREFILLED_SYRINGE | INTRAMUSCULAR | Status: DC | PRN
  Administered 2021-06-28 – 2021-06-30 (×4): 25 ug via INTRAVENOUS
  Filled 2021-06-27 (×4): qty 1

## 2021-06-27 MED ORDER — LIDOCAINE 2% (20 MG/ML) 5 ML SYRINGE
INTRAMUSCULAR | Status: AC
  Filled 2021-06-27: qty 5

## 2021-06-27 MED ORDER — LACTATED RINGERS IV SOLN: INTRAVENOUS | Status: DC | PRN

## 2021-06-27 MED ORDER — PHENYLEPHRINE 40 MCG/ML (10ML) SYRINGE FOR IV PUSH (FOR BLOOD PRESSURE SUPPORT)
PREFILLED_SYRINGE | INTRAVENOUS | Status: DC | PRN
  Administered 2021-06-27 (×3): 120 ug via INTRAVENOUS
  Administered 2021-06-27: 160 ug via INTRAVENOUS

## 2021-06-27 MED ORDER — OXYCODONE HCL 5 MG PO TABS
5.0000 mg | ORAL_TABLET | ORAL | Status: DC | PRN
  Administered 2021-06-27: 10 mg via ORAL
  Administered 2021-06-27: 5 mg via ORAL
  Administered 2021-06-28 – 2021-07-04 (×24): 10 mg via ORAL
  Filled 2021-06-27 (×25): qty 2

## 2021-06-27 MED ORDER — CHLORHEXIDINE GLUCONATE 0.12 % MT SOLN: 15.0000 mL | Freq: Once | OROMUCOSAL | Status: AC

## 2021-06-27 MED ORDER — ACETAMINOPHEN 500 MG PO TABS
1000.0000 mg | ORAL_TABLET | Freq: Once | ORAL | Status: AC
  Administered 2021-06-27: 1000 mg via ORAL
  Filled 2021-06-27: qty 2

## 2021-06-27 MED ORDER — PROPOFOL 10 MG/ML IV BOLUS
INTRAVENOUS | Status: AC
  Filled 2021-06-27: qty 20

## 2021-06-27 MED ORDER — ONDANSETRON HCL 4 MG/2ML IJ SOLN: 4.0000 mg | Freq: Four times a day (QID) | INTRAMUSCULAR | Status: DC | PRN

## 2021-06-27 MED ORDER — FENTANYL CITRATE (PF) 250 MCG/5ML IJ SOLN
INTRAMUSCULAR | Status: DC | PRN
  Administered 2021-06-27: 250 ug via INTRAVENOUS

## 2021-06-27 MED ORDER — DEXAMETHASONE SODIUM PHOSPHATE 10 MG/ML IJ SOLN
INTRAMUSCULAR | Status: DC | PRN
Start: 2021-06-27 — End: 2021-06-27
  Administered 2021-06-27: 10 mg via INTRAVENOUS

## 2021-06-27 MED ORDER — HYDROMORPHONE HCL 1 MG/ML IJ SOLN
INTRAMUSCULAR | Status: AC
  Administered 2021-06-27: 0.5 mg via INTRAVENOUS
  Filled 2021-06-27: qty 1

## 2021-06-27 MED ORDER — DEXAMETHASONE SODIUM PHOSPHATE 10 MG/ML IJ SOLN
INTRAMUSCULAR | Status: AC
  Filled 2021-06-27: qty 1

## 2021-06-27 MED ORDER — ALBUTEROL SULFATE (2.5 MG/3ML) 0.083% IN NEBU: 2.5000 mg | INHALATION_SOLUTION | RESPIRATORY_TRACT | Status: DC

## 2021-06-27 MED ORDER — CHLORHEXIDINE GLUCONATE 0.12 % MT SOLN
OROMUCOSAL | Status: AC
  Administered 2021-06-27: 15 mL
  Filled 2021-06-27: qty 15

## 2021-06-27 MED ORDER — ALPRAZOLAM 0.25 MG PO TABS
0.2500 mg | ORAL_TABLET | Freq: Three times a day (TID) | ORAL | Status: DC | PRN
  Administered 2021-06-27 – 2021-07-04 (×17): 0.25 mg via ORAL
  Filled 2021-06-27 (×17): qty 1

## 2021-06-27 MED ORDER — PHENYLEPHRINE HCL-NACL 20-0.9 MG/250ML-% IV SOLN
INTRAVENOUS | Status: DC | PRN
  Administered 2021-06-27: 30 ug/min via INTRAVENOUS

## 2021-06-27 MED ORDER — LORATADINE 10 MG PO TABS
10.0000 mg | ORAL_TABLET | Freq: Every evening | ORAL | Status: DC
  Administered 2021-06-28 – 2021-07-03 (×6): 10 mg via ORAL
  Filled 2021-06-27 (×6): qty 1

## 2021-06-27 MED ORDER — KETOROLAC TROMETHAMINE 15 MG/ML IJ SOLN
INTRAMUSCULAR | Status: AC
Start: 2021-06-27 — End: 2021-06-27
  Administered 2021-06-27: 15 mg via INTRAVENOUS
  Filled 2021-06-27: qty 1

## 2021-06-27 MED ORDER — 0.9 % SODIUM CHLORIDE (POUR BTL) OPTIME
TOPICAL | Status: DC | PRN
  Administered 2021-06-27: 1000 mL

## 2021-06-27 MED ORDER — BISACODYL 5 MG PO TBEC
10.0000 mg | DELAYED_RELEASE_TABLET | Freq: Every day | ORAL | Status: DC
  Administered 2021-06-28 – 2021-07-04 (×6): 10 mg via ORAL
  Filled 2021-06-27 (×7): qty 2

## 2021-06-27 SURGICAL SUPPLY — 89 items
ADH SKN CLS APL DERMABOND .7 (GAUZE/BANDAGES/DRESSINGS) ×1
APL SRG 22X2 LUM MLBL SLNT (VASCULAR PRODUCTS)
APPLICATOR TIP EXT COSEAL (VASCULAR PRODUCTS) IMPLANT
BAG SPEC RTRVL LRG 6X4 10 (ENDOMECHANICALS)
BLADE CLIPPER SURG (BLADE) ×2 IMPLANT
CANISTER SUCT 3000ML PPV (MISCELLANEOUS) ×3 IMPLANT
CATH THORACIC 28FR (CATHETERS) IMPLANT
CATH THORACIC 28FR RT ANG (CATHETERS) IMPLANT
CATH THORACIC 36FR (CATHETERS) IMPLANT
CATH THORACIC 36FR RT ANG (CATHETERS) IMPLANT
CLIP VESOCCLUDE MED 6/CT (CLIP) ×2 IMPLANT
CNTNR URN SCR LID CUP LEK RST (MISCELLANEOUS) ×2 IMPLANT
CONN ST 1/4X3/8  BEN (MISCELLANEOUS) ×4
CONN ST 1/4X3/8 BEN (MISCELLANEOUS) IMPLANT
CONN Y 3/8X3/8X3/8  BEN (MISCELLANEOUS)
CONN Y 3/8X3/8X3/8 BEN (MISCELLANEOUS) IMPLANT
CONT SPEC 4OZ STRL OR WHT (MISCELLANEOUS) ×22
COVER SURGICAL LIGHT HANDLE (MISCELLANEOUS) ×1 IMPLANT
DERMABOND ADVANCED (GAUZE/BANDAGES/DRESSINGS) ×1
DERMABOND ADVANCED .7 DNX12 (GAUZE/BANDAGES/DRESSINGS) IMPLANT
DRAIN CHANNEL 28F RND 3/8 FF (WOUND CARE) ×2 IMPLANT
DRAIN CHANNEL 32F RND 10.7 FF (WOUND CARE) IMPLANT
DRAPE CV SPLIT W-CLR ANES SCRN (DRAPES) ×2 IMPLANT
DRAPE ORTHO SPLIT 77X108 STRL (DRAPES) ×2
DRAPE SURG ORHT 6 SPLT 77X108 (DRAPES) ×1 IMPLANT
DRAPE WARM FLUID 44X44 (DRAPES) ×1 IMPLANT
ELECT BLADE 6.5 EXT (BLADE) ×2 IMPLANT
ELECT REM PT RETURN 9FT ADLT (ELECTROSURGICAL) ×2
ELECTRODE REM PT RTRN 9FT ADLT (ELECTROSURGICAL) ×1 IMPLANT
FELT TEFLON 1X6 (MISCELLANEOUS) ×1 IMPLANT
GAUZE 4X4 16PLY ~~LOC~~+RFID DBL (SPONGE) ×2 IMPLANT
GAUZE SPONGE 4X4 12PLY STRL (GAUZE/BANDAGES/DRESSINGS) ×2 IMPLANT
GLOVE SURG SIGNA 7.5 PF LTX (GLOVE) ×4 IMPLANT
GOWN STRL REUS W/ TWL LRG LVL3 (GOWN DISPOSABLE) ×2 IMPLANT
GOWN STRL REUS W/ TWL XL LVL3 (GOWN DISPOSABLE) ×1 IMPLANT
GOWN STRL REUS W/TWL LRG LVL3 (GOWN DISPOSABLE) ×4
GOWN STRL REUS W/TWL XL LVL3 (GOWN DISPOSABLE) ×6
HANDLE STAPLE ENDO GIA SHORT (STAPLE)
HEMOSTAT SURGICEL 2X14 (HEMOSTASIS) IMPLANT
KIT BASIN OR (CUSTOM PROCEDURE TRAY) ×2 IMPLANT
KIT TURNOVER KIT B (KITS) ×2 IMPLANT
NDL HYPO 25GX1X1/2 BEV (NEEDLE) ×1 IMPLANT
NDL SPNL 18GX3.5 QUINCKE PK (NEEDLE) IMPLANT
NDL SPNL 22GX3.5 QUINCKE BK (NEEDLE) ×1 IMPLANT
NEEDLE HYPO 25GX1X1/2 BEV (NEEDLE) ×2 IMPLANT
NEEDLE SPNL 18GX3.5 QUINCKE PK (NEEDLE) IMPLANT
NEEDLE SPNL 22GX3.5 QUINCKE BK (NEEDLE) IMPLANT
NS IRRIG 1000ML POUR BTL (IV SOLUTION) ×6 IMPLANT
PACK CHEST (CUSTOM PROCEDURE TRAY) ×2 IMPLANT
PAD ARMBOARD 7.5X6 YLW CONV (MISCELLANEOUS) ×5 IMPLANT
PENCIL BUTTON HOLSTER BLD 10FT (ELECTRODE) ×1 IMPLANT
POUCH ENDO CATCH II 15MM (MISCELLANEOUS) IMPLANT
POUCH SPECIMEN RETRIEVAL 10MM (ENDOMECHANICALS) IMPLANT
SEALANT PROGEL (MISCELLANEOUS) ×1 IMPLANT
SEALANT SURG COSEAL 4ML (VASCULAR PRODUCTS) IMPLANT
SEALANT SURG COSEAL 8ML (VASCULAR PRODUCTS) IMPLANT
SOL ANTI FOG 6CC (MISCELLANEOUS) ×1 IMPLANT
SOLUTION ANTI FOG 6CC (MISCELLANEOUS) ×1
SPONGE INTESTINAL PEANUT (DISPOSABLE) ×2 IMPLANT
SPONGE T-LAP 18X18 ~~LOC~~+RFID (SPONGE) ×6 IMPLANT
SPONGE T-LAP 4X18 ~~LOC~~+RFID (SPONGE) ×2 IMPLANT
SPONGE TONSIL TAPE 1 RFD (DISPOSABLE) ×2 IMPLANT
STAPLER ENDO GIA 12 SHRT THIN (STAPLE) IMPLANT
STAPLER ENDO GIA 12MM SHORT (STAPLE) IMPLANT
SUT PROLENE 4 0 RB 1 (SUTURE) ×2
SUT PROLENE 4-0 RB1 .5 CRCL 36 (SUTURE) IMPLANT
SUT SILK  1 MH (SUTURE) ×4
SUT SILK 1 MH (SUTURE) ×2 IMPLANT
SUT SILK 1 TIES 10X30 (SUTURE) ×1 IMPLANT
SUT SILK 2 0SH CR/8 30 (SUTURE) IMPLANT
SUT SILK 3 0SH CR/8 30 (SUTURE) IMPLANT
SUT VIC AB 1 CTX 36 (SUTURE)
SUT VIC AB 1 CTX36XBRD ANBCTR (SUTURE) IMPLANT
SUT VIC AB 2-0 CTX 36 (SUTURE) ×2 IMPLANT
SUT VIC AB 3-0 MH 27 (SUTURE) IMPLANT
SUT VIC AB 3-0 X1 27 (SUTURE) ×1 IMPLANT
SUT VICRYL 2 TP 1 (SUTURE) IMPLANT
SYR 10ML LL (SYRINGE) IMPLANT
SYR 30ML LL (SYRINGE) ×1 IMPLANT
SYSTEM SAHARA CHEST DRAIN ATS (WOUND CARE) ×2 IMPLANT
TAPE CLOTH 4X10 WHT NS (GAUZE/BANDAGES/DRESSINGS) ×2 IMPLANT
TAPE CLOTH SURG 4X10 WHT LF (GAUZE/BANDAGES/DRESSINGS) ×1 IMPLANT
TIP APPLICATOR SPRAY EXTEND 16 (VASCULAR PRODUCTS) ×1 IMPLANT
TOWEL GREEN STERILE (TOWEL DISPOSABLE) ×2 IMPLANT
TOWEL GREEN STERILE FF (TOWEL DISPOSABLE) ×2 IMPLANT
TRAY FOLEY MTR SLVR 16FR STAT (SET/KITS/TRAYS/PACK) ×2 IMPLANT
TROCAR XCEL BLADELESS 5X75MML (TROCAR) ×2 IMPLANT
TROCAR XCEL NON-BLD 11X100MML (ENDOMECHANICALS) ×1 IMPLANT
WATER STERILE IRR 1000ML POUR (IV SOLUTION) ×2 IMPLANT

## 2021-06-27 NOTE — Anesthesia Postprocedure Evaluation (Signed)
Anesthesia Post Note ? ?Patient: [name] Potts ? ?Procedure(s) Performed: VIDEO ASSISTED THORACOSCOPY (VATS), Drainage of RIGHT pleural effusion/ DECORTICATION (Right: Chest) ? ?  ? ?Patient location during evaluation: PACU ?Anesthesia Type: General ?Level of consciousness: awake and alert, oriented and patient cooperative ?Pain management: pain level controlled ?Vital Signs Assessment: post-procedure vital signs reviewed and stable ?Respiratory status: spontaneous breathing, nonlabored ventilation, respiratory function stable and patient connected to nasal cannula oxygen ?Cardiovascular status: blood pressure returned to baseline and stable ?Postop Assessment: no apparent nausea or vomiting ?Anesthetic complications: no ? ? ?No notable events documented. ? ?Last Vitals:  ?Vitals:  ? 06/27/21 1232 06/27/21 1245  ?BP:  99/65  ?Pulse: 84 86  ?Resp: 19 19  ?Temp:    ?SpO2: 100% 100%  ?  ?Last Pain:  ?Vitals:  ? 06/27/21 1245  ?TempSrc:   ?PainSc: Asleep  ? ? ?  ?  ?  ?  ?  ?  ? ?Shannara Winbush,E. Nazaret Chea ? ? ? ? ?

## 2021-06-27 NOTE — Transfer of Care (Signed)
Immediate Anesthesia Transfer of Care Note ? ?Patient: [name] Potts ? ?Procedure(s) Performed: VIDEO ASSISTED THORACOSCOPY (VATS), Drainage of RIGHT pleural effusion/ DECORTICATION (Right: Chest) ? ?Patient Location: PACU ? ?Anesthesia Type:General ? ?Level of Consciousness: awake and alert  ? ?Airway & Oxygen Therapy: Patient Spontanous Breathing and Patient connected to face mask oxygen ? ?Post-op Assessment: Report given to RN ? ?Post vital signs: Reviewed and stable ? ?Last Vitals:  ?Vitals Value Taken Time  ?BP 116/62 06/27/21 1159  ?Temp    ?Pulse 95 06/27/21 1200  ?Resp 23 06/27/21 1200  ?SpO2 100 % 06/27/21 1200  ?Vitals shown include unvalidated device data. ? ?Last Pain:  ?Vitals:  ? 06/27/21 0715  ?TempSrc:   ?PainSc: 0-No pain  ?   ? ?  ? ?Complications: No notable events documented. ?

## 2021-06-27 NOTE — Brief Op Note (Signed)
06/27/2021 ? ?11:39 AM ? ?PATIENT:  [name] Potts  51 y.o. male ? ?PRE-OPERATIVE DIAGNOSIS:  Right Pleural Effusion ? ?POST-OPERATIVE DIAGNOSIS:  Right Pleural Effusion ? ?PROCEDURE:  Procedure(s): ?VIDEO ASSISTED THORACOSCOPY (VATS), Drainage of RIGHT pleural effusion/ DECORTICATION (Right) ? ?SURGEON:  Surgeon(s) and Role: ?   * Melrose Nakayama, MD - Primary ? ?PHYSICIAN ASSISTANT: Cotina Freedman PA-C ? ?ASSISTANTS: none  ? ?ANESTHESIA:   general ? ?EBL:  750 mL  ? ?BLOOD ADMINISTERED:none ? ?DRAINS:  2 28 F Chest Tube(s) in the RIGHT HEMITHORAX   ? ?LOCAL MEDICATIONS USED:  NONE ? ?SPECIMEN:  Source of Specimen:  PLEURAL PEEL FOR PATH, EFFUSION FOR CYTOLOGY , PARIETAL BIOPSIES FOR PATH, , TISSUE FOR MICRO ? ?DISPOSITION OF SPECIMEN:   PATH/CYTOLOGY/MICRO ? ?COUNTS:  YES ? ?TOURNIQUET:  * No tourniquets in log * ? ?DICTATION: .Other Dictation: Dictation Number PENDING ? ?PLAN OF CARE: Admit to inpatient  ? ?PATIENT DISPOSITION:  PACU - hemodynamically stable. ?  ?Delay start of Pharmacological VTE agent (>24hrs) due to surgical blood loss or risk of bleeding: YES ? ? ? ?

## 2021-06-27 NOTE — Op Note (Signed)
NAME: [name], Potts. ?MEDICAL RECORD NO: [id number] ?ACCOUNT NO: [id number] ?DATE OF BIRTH: Jul 15, 1970 ?FACILITY: [facility] ?LOCATION: [facility] ?PHYSICIAN: Revonda Standard. Roxan Hockey, MD ? ?Operative Report  ? ?DATE OF PROCEDURE: 06/27/2021 ? ?PREOPERATIVE DIAGNOSIS:  Recurrent right pleural effusion. ? 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 ? ?PROCEDURE:   ?Right video-assisted thoracoscopy,  ?Drainage of pleural effusion,  ?Pleural biopsies and  ?Decortication. ? ?SURGEON:  Revonda Standard. Roxan Hockey, MD ? ?ASSISTANT: Jadene Pierini, PA. ? ?ANESTHESIA:  General. ? ?FINDINGS:  Large pleural effusion- 1.75 liters of bloody fluid.  Multiple tears in the lung after drainage of the fluid. Lower lobe entrapped with tumor, incomplete reexpansion post decortication.  Biopsy showed a malignancy. ? ?CLINICAL NOTE:  [name] Potts is a 51 year old gentleman with about a 37-month history of recurrent right pleural effusions.  He has had 2 previous cytologies that were negative.  Bronchoscopy showed no endobronchial mass lesions. After his most recent  ?bronchoscopy, he had evidence of a trapped lung and had rapid reaccumulation of the fluid.  He was advised to undergo right VATS for drainage of the effusion and possible decortication of the lung.  The indications, risks, benefits, and alternatives were ? discussed in detail with the patient.  He accepted the risks and agreed to proceed. ? ?DESCRIPTION OF PROCEDURE:  [name] Potts was brought to the preoperative holding area on 06/27/2021.  Anesthesia placed an arterial blood pressure monitoring line and established intravenous access.  He was taken to the operating room and anesthetized and  ?intubated with a double lumen endotracheal tube.  Intravenous antibiotics were administered.  A Foley catheter was placed.  Sequential compression devices were placed on the calves for DVT prophylaxis.  He was placed in a left lateral decubitus position  ?and the right chest was  prepped and draped in the usual sterile fashion. ? ?A timeout was performed.  An incision was made in the eighth interspace in the midaxillary line.  A sucker was advanced into the chest.  There was drainage of about 1.75 liters of bloody pleural fluid and then there was a sudden change to a darker more  ?fresh blood appearance.  Suction was stopped and an incision was made in the fifth interspace anterolaterally.  No rib spreading was performed during the procedure.  The scope was advanced into the chest.  There was some residual effusion.  There was  ?some fresh blood and clot, and it was clear that there were multiple visceral pleural tears from areas of the lung where there was unaffected lung adjacent to lung encased in tumor.  There was dense tumor over the pericardium and diaphragm and diffuse  ?thickening consistent with malignancy of the parietal pleura.  The middle lobe was about 50% denuded. The upper lobe was adherent to the chest wall superiorly.  The lower lobe was clearly entrapped with a fibrous versus a malignant peel.  Biopsies were  ?taken of the pleura and sent for frozen section.  When the right lung was ventilated, there were significant air leaks.  An attempt was made to decorticate the lung to allow it to fill the space.  This primarily focused on the lower lobe, which was the  ?most entrapped.  There were multiple areas where a plane could not be developed and areas where there was retraction.  The pleura was scored with cautery over those areas.  This was a slow and tedious process and despite all efforts, the lower lobe would never completely ? reexpand.  There  was significant improvement in the expansion, but there was still significant space.  Chest was copiously irrigated with warm saline.  The frozen section returned showing malignancy.  Further characterization will have to await  ?final pathology.  Two 28-French Blake drains were placed through separate incisions, one was placed  through the incision that was used for the scope and the other was placed through a second incision.  They were secured with #1 silk sutures.  Dual  ?lung ventilation was resumed.  The working incision was closed in standard fashion.  The chest tubes were placed to a Pleur-Evac on suction.  There was an air leak.  The patient then was extubated in the operating room and taken to the postanesthetic  ?care unit in good condition. ? ?All sponge, needle and instrument counts were correct at the end of the procedure. ? ?Experienced assistance was necessary for this case due to complexity.  Jadene Pierini assisted with the camera, suctioning, exposure and wound closure. ?[facility]I ?D: 06/27/2021 5:36:17 pm T: 06/27/2021 11:01:00 pm  ?JOB: [id number]/ [id number]  ?

## 2021-06-27 NOTE — Anesthesia Procedure Notes (Signed)
Procedure Name: Intubation ?Date/Time: 06/27/2021 8:55 AM ?Performed by: Valda Favia, CRNA ?Pre-anesthesia Checklist: Patient identified, Emergency Drugs available, Suction available and Patient being monitored ?Patient Re-evaluated:Patient Re-evaluated prior to induction ?Oxygen Delivery Method: Circle system utilized ?Preoxygenation: Pre-oxygenation with 100% oxygen ?Induction Type: IV induction ?Ventilation: Mask ventilation without difficulty ?Laryngoscope Size: Mac and 4 ?Grade View: Grade I ?Endobronchial tube: Double lumen EBT and Left and 39 Fr ?Number of attempts: 1 ?Airway Equipment and Method: Stylet and Oral airway ?Placement Confirmation: ETT inserted through vocal cords under direct vision, positive ETCO2 and breath sounds checked- equal and bilateral ?Tube secured with: Tape ?Dental Injury: Teeth and Oropharynx as per pre-operative assessment  ? ? ? ? ?

## 2021-06-27 NOTE — Progress Notes (Signed)
Pt's daughter requesting update from Psychologist, sport and exercise. Fredric Slabach [phone] ?
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
## 2021-06-27 NOTE — Anesthesia Procedure Notes (Addendum)
Arterial Line Insertion ?Start/End3/15/2023 8:55 AM, 06/27/2021 9:00 AM ?Performed by: Annye Asa, MD, Valda Favia, CRNA, CRNA ? Patient location: OR. ?Preanesthetic checklist: patient identified, IV checked, site marked, risks and benefits discussed, surgical consent, monitors and equipment checked, pre-op evaluation, timeout performed and anesthesia consent ?Left, radial was placed ?Catheter size: 20 G ?Hand hygiene performed  and maximum sterile barriers used  ? ?Attempts: 2 ?Procedure performed without using ultrasound guided technique. ?Following insertion, dressing applied and Biopatch. ?Post procedure assessment: normal ? ?Post procedure complications: unsuccessful attempts. ?Patient tolerated the procedure well with no immediate complications. ? ? ?

## 2021-06-27 NOTE — Progress Notes (Signed)
?   06/27/21 2000  ?Assess: MEWS Score  ?Temp 98 ?F (36.7 ?C)  ?BP 107/83  ?Pulse Rate (!) 113  ?ECG Heart Rate (!) 113  ?Resp 19  ?Level of Consciousness Alert  ?SpO2 99 %  ?O2 Device Room Air  ?Assess: MEWS Score  ?MEWS Temp 0  ?MEWS Systolic 0  ?MEWS Pulse 2  ?MEWS RR 0  ?MEWS LOC 0  ?MEWS Score 2  ?MEWS Score Color Yellow  ?Assess: if the MEWS score is Yellow or Red  ?Were vital signs taken at a resting state? Yes  ?Focused Assessment No change from prior assessment  ?Early Detection of Sepsis Score *See Row Information* Low  ?MEWS guidelines implemented *See Row Information* Yes  ?Take Vital Signs  ?Increase Vital Sign Frequency  Yellow: Q 2hr X 2 then Q 4hr X 2, if remains yellow, continue Q 4hrs  ?Escalate  ?MEWS: Escalate Yellow: discuss with charge nurse/RN and consider discussing with provider and RRT  ?Notify: Charge Nurse/RN  ?Name of Charge Nurse/RN Notified Warren Danes, RN  ?Date Charge Nurse/RN Notified 06/27/21  ?Time Charge Nurse/RN Notified 2000  ?Document  ?Patient Outcome Other (Comment) ?(patient stable. remains on department)  ?Progress note created (see row info) Yes  ? ? ?

## 2021-06-27 NOTE — Interval H&P Note (Signed)
History and Physical Interval Note: ? ?06/27/2021 ?8:13 AM ? ?[name]  has presented today for surgery, with the diagnosis of Right Pleural Effusion.  The various methods of treatment have been discussed with the patient and family. After consideration of risks, benefits and other options for treatment, the patient has consented to  Procedure(s): ?VIDEO ASSISTED THORACOSCOPY (VATS), Drainage of RIGHT pleural effusion/ DECORTICATION (Right) as a surgical intervention.  The patient's history has been reviewed, patient examined, no change in status, stable for surgery.  I have reviewed the patient's chart and labs.  Questions were answered to the patient's satisfaction.   ? ? ?Melrose Nakayama ? ? ?

## 2021-06-27 NOTE — Plan of Care (Signed)
?  Problem: Cardiac: ?Goal: Will achieve and/or maintain hemodynamic stability ?Outcome: Progressing ?  ?Problem: Pain Management: ?Goal: Pain level will decrease ?Outcome: Progressing ?  ?
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
## 2021-06-28 ENCOUNTER — Encounter (HOSPITAL_COMMUNITY): Payer: Self-pay | Admitting: Thoracic Surgery (Cardiothoracic Vascular Surgery)

## 2021-06-28 ENCOUNTER — Inpatient Hospital Stay (HOSPITAL_COMMUNITY): Payer: Commercial Managed Care - PPO

## 2021-06-28 LAB — CBC
HCT: 29.4 % — ABNORMAL LOW (ref 39.0–52.0)
Hemoglobin: 9.7 g/dL — ABNORMAL LOW (ref 13.0–17.0)
MCH: 29 pg (ref 26.0–34.0)
MCHC: 33 g/dL (ref 30.0–36.0)
MCV: 88 fL (ref 80.0–100.0)
Platelets: 256 10*3/uL (ref 150–400)
RBC: 3.34 MIL/uL — ABNORMAL LOW (ref 4.22–5.81)
RDW: 13.3 % (ref 11.5–15.5)
WBC: 15.8 10*3/uL — ABNORMAL HIGH (ref 4.0–10.5)
nRBC: 0 % (ref 0.0–0.2)

## 2021-06-28 LAB — ACID FAST SMEAR (AFB, MYCOBACTERIA)
Acid Fast Smear: NEGATIVE
Acid Fast Smear: NEGATIVE

## 2021-06-28 LAB — BASIC METABOLIC PANEL
Anion gap: 10 (ref 5–15)
BUN: 23 mg/dL — ABNORMAL HIGH (ref 6–20)
CO2: 23 mmol/L (ref 22–32)
Calcium: 8.4 mg/dL — ABNORMAL LOW (ref 8.9–10.3)
Chloride: 100 mmol/L (ref 98–111)
Creatinine, Ser: 1.45 mg/dL — ABNORMAL HIGH (ref 0.61–1.24)
GFR, Estimated: 58 mL/min — ABNORMAL LOW (ref >60)
Glucose, Bld: 125 mg/dL — ABNORMAL HIGH (ref 70–99)
Potassium: 5 mmol/L (ref 3.5–5.1)
Sodium: 133 mmol/L — ABNORMAL LOW (ref 135–145)

## 2021-06-28 LAB — CYTOLOGY - NON PAP

## 2021-06-28 MED ORDER — SODIUM CHLORIDE 0.9 % IV SOLN: INTRAVENOUS | Status: DC

## 2021-06-28 MED ORDER — ALBUTEROL SULFATE (2.5 MG/3ML) 0.083% IN NEBU: 2.5000 mg | INHALATION_SOLUTION | RESPIRATORY_TRACT | Status: DC | PRN

## 2021-06-28 NOTE — Progress Notes (Addendum)
? ?   ?  [name] ?      [place] [address] ?            [phone]   ? ?  ? ?1 Day Post-Op Procedure(s) (LRB): ?VIDEO ASSISTED THORACOSCOPY (VATS), Drainage of RIGHT pleural effusion/ DECORTICATION (Right) ? ?Subjective: ? ?Patient sitting up in bed eating breakfast.  Doing okay, feels like he has finally got his pain under good control.  Denies N/V ? ?Objective: ?Vital signs in last 24 hours: ?Temp:  [97.2 ?F (36.2 ?C)-98.6 ?F (37 ?C)] 97.6 ?F (36.4 ?C) (03/16 0600) ?Pulse Rate:  [82-117] 90 (03/16 0600) ?Cardiac Rhythm: Sinus tachycardia (03/15 1930) ?Resp:  [12-21] 19 (03/16 0600) ?BP: (87-113)/(59-83) 104/76 (03/16 0600) ?SpO2:  [94 %-100 %] 99 % (03/16 0600) ?Arterial Line BP: (96-101)/(43-46) 97/45 (03/15 1245) ? ?Intake/Output from previous day: ?03/15 0701 - 03/16 0700 ?In: 3347.5 [P.O.:240; I.V.:2657.5; IV Piggyback:450] ?Out: 7207 [Urine:530; Blood:750; Chest Tube:1010] ? ?General appearance: alert, cooperative, and no distress ?Heart: regular rate and rhythm ?Lungs: diminished breath sounds bibasilar ?Abdomen: soft, non-tender; bowel sounds normal; no masses,  no organomegaly ?Extremities: extremities normal, atraumatic, no cyanosis or edema ?Wound: clean and dry ? ?Lab Results: ?Recent Labs  ?  06/25/21 ?2182 06/28/21 ?0119  ?WBC 12.2* 15.8*  ?HGB 13.8 9.7*  ?HCT 40.8 29.4*  ?PLT 295 256  ? ?BMET:  ?Recent Labs  ?  06/25/21 ?8833 06/28/21 ?0119  ?NA 136 133*  ?K 4.1 5.0  ?CL 103 100  ?CO2 21* 23  ?GLUCOSE 104* 125*  ?BUN 11 23*  ?CREATININE 0.88 1.45*  ?CALCIUM 9.5 8.4*  ?  ?PT/INR:  ?Recent Labs  ?  06/25/21 ?0947  ?LABPROT 13.7  ?INR 1.1  ? ?ABG ?   ?Component Value Date/Time  ? PHART 7.43 06/25/2021 1010  ? HCO3 23.9 06/25/2021 1010  ? ACIDBASEDEF 0.1 06/25/2021 1010  ? O2SAT 98.5 06/25/2021 1010  ? ?CBG (last 3)  ?No results for input(s): GLUCAP in the last 72 hours. ? ?Assessment/Plan: ?S/P Procedure(s) (LRB): ?VIDEO ASSISTED THORACOSCOPY (VATS), Drainage of RIGHT pleural  effusion/ DECORTICATION (Right) ? ?CV- NSR, BP controlled ?Pulm- CT on suction, 1010 out since surgery, + air leak with cough... CXR shows incomplete re-expansion of lower lobe... leave chest tube in place today ?Renal- creatinine up to 1.45, suspect due to Toradol administration, will discontinued, and continue IV hydration for a few hours, then discontinue later today, repeat BMET in AM ?D/C Foley ?Lovenox for DVT prophylaxis ?Dispo- patient stable, d/c Toradol with elevation in creatinine, will stop IV fluids in a few hours, CT with >1000 cc output since surgery, + air leak, with incomplete re-expansion of lung, leave chest tube to suction today ? ? LOS: 1 day  ? ?Ellwood Handler, PA-C ?06/28/2021 ?Patient seen and examined, agree with above ?Dc Toradol ?IVF for 24 hours, recheck creatinine in AM ?Will leave on suction today and try water seal tomorrow ? ?Revonda Standard Roxan Hockey, MD ?[facility] ?[phone] ? ? ?

## 2021-06-28 NOTE — Plan of Care (Signed)
?  Problem: Education: ?Goal: Knowledge of the prescribed therapeutic regimen will improve ?Outcome: Progressing ?  ?Problem: Cardiac: ?Goal: Will achieve and/or maintain hemodynamic stability ?Outcome: Progressing ?  ?Problem: Respiratory: ?Goal: Respiratory status will improve ?Outcome: Progressing ?  ?Problem: Pain Management: ?Goal: Pain level will decrease ?Outcome: Progressing ?  ?Problem: Skin Integrity: ?Goal: Wound healing without signs and symptoms infection will improve ?Outcome: Progressing ?  ?Problem: Clinical Measurements: ?Goal: Ability to maintain clinical measurements within normal limits will improve ?Outcome: Progressing ?Goal: Will remain free from infection ?Outcome: Progressing ?Goal: Respiratory complications will improve ?Outcome: Progressing ?  ?Problem: Nutrition: ?Goal: Adequate nutrition will be maintained ?Outcome: Progressing ?  ?Problem: Coping: ?Goal: Level of anxiety will decrease ?Outcome: Progressing ?  ?

## 2021-06-28 NOTE — Progress Notes (Signed)
Mobility Specialist Progress Note  ? ? 06/28/21 1106  ?Mobility  ?Activity Ambulated with assistance in room  ?Level of Assistance Standby assist, set-up cues, supervision of patient - no hands on  ?Assistive Device  ?(IV pole)  ?Distance Ambulated (ft) 200 ft  ?Activity Response Tolerated well  ?$Mobility charge 1 Mobility  ? ?During Mobility: 119 HR ?Post-Mobility: 103 HR, 97% SpO2 ? ?Pt received in bed and agreeable. C/o some soreness at the chest tube insertion. Returned to sitting in bed with wife present and RT present for treatment.  ? ?[name] Potts ?Mobility Specialist  ?M.S. 5N: [phone]  ?

## 2021-06-29 ENCOUNTER — Inpatient Hospital Stay (HOSPITAL_COMMUNITY): Payer: Commercial Managed Care - PPO

## 2021-06-29 LAB — CBC
HCT: 29.9 % — ABNORMAL LOW (ref 39.0–52.0)
Hemoglobin: 9.8 g/dL — ABNORMAL LOW (ref 13.0–17.0)
MCH: 29.3 pg (ref 26.0–34.0)
MCHC: 32.8 g/dL (ref 30.0–36.0)
MCV: 89.5 fL (ref 80.0–100.0)
Platelets: 278 10*3/uL (ref 150–400)
RBC: 3.34 MIL/uL — ABNORMAL LOW (ref 4.22–5.81)
RDW: 13.6 % (ref 11.5–15.5)
WBC: 19.6 10*3/uL — ABNORMAL HIGH (ref 4.0–10.5)
nRBC: 0 % (ref 0.0–0.2)

## 2021-06-29 LAB — COMPREHENSIVE METABOLIC PANEL
ALT: 20 U/L (ref 0–44)
AST: 25 U/L (ref 15–41)
Albumin: 3.1 g/dL — ABNORMAL LOW (ref 3.5–5.0)
Alkaline Phosphatase: 80 U/L (ref 38–126)
Anion gap: 15 (ref 5–15)
BUN: 14 mg/dL (ref 6–20)
CO2: 20 mmol/L — ABNORMAL LOW (ref 22–32)
Calcium: 8.5 mg/dL — ABNORMAL LOW (ref 8.9–10.3)
Chloride: 103 mmol/L (ref 98–111)
Creatinine, Ser: 0.93 mg/dL (ref 0.61–1.24)
GFR, Estimated: >60 mL/min (ref >60)
Glucose, Bld: 88 mg/dL (ref 70–99)
Potassium: 4.2 mmol/L (ref 3.5–5.1)
Sodium: 138 mmol/L (ref 135–145)
Total Bilirubin: 0.5 mg/dL (ref 0.3–1.2)
Total Protein: 6.1 g/dL — ABNORMAL LOW (ref 6.5–8.1)

## 2021-06-29 LAB — SURGICAL PATHOLOGY

## 2021-06-29 LAB — PATHOLOGIST SMEAR REVIEW

## 2021-06-29 NOTE — Progress Notes (Signed)
Mobility Specialist Progress Note  ? ? 06/29/21 1530  ?Mobility  ?Activity Refused mobility  ? ?Pt stated he has been up walking in his room.  ? ?Hildred Alamin ?Mobility Specialist  ?M.S. 5N: [phone]  ?

## 2021-06-29 NOTE — TOC Progression Note (Signed)
Transition of Care (TOC) - Progression Note  ? ? ?Patient Details  ?Name: [name] Potts ?MRN: [id number] ?Date of Birth: [birth year] ? ?Transition of Care (TOC) CM/SW Contact  ?Angelita Ingles, RN ?Phone Number:[phone] ? ?06/29/2021, 3:18 PM ? ?Clinical Narrative:    ? ?Transition of Care (TOC) Screening Note ? ? ?Patient Details  ?Name: [name] Potts ?Date of Birth: [birth year] ? ? ?Transition of Care (TOC) CM/SW Contact:    ?Angelita Ingles, RN ?Phone Number: ?06/29/2021, 3:18 PM ? ? ? ?Transition of Care Department [facility]) has reviewed patient and no TOC needs have been identified at this time. We will continue to monitor patient advancement through interdisciplinary progression rounds. If new patient transition needs arise, please place a TOC consult. ? ? ? ? ?  ?  ? ?Expected Discharge Plan and Services ?  ?  ?  ?  ?  ?                ?  ?  ?  ?  ?  ?  ?  ?  ?  ?  ? ? ?Social Determinants of Health (SDOH) Interventions ?  ? ?Readmission Risk Interventions ?No flowsheet data found. ? ?

## 2021-06-29 NOTE — Progress Notes (Addendum)
? ?   ?  [address] ?      [place] [address] ?            [phone]   ? ?  ?2 Days Post-Op Procedure(s) (LRB): ?VIDEO ASSISTED THORACOSCOPY (VATS), Drainage of RIGHT pleural effusion/ DECORTICATION (Right) ? ?Subjective: ? ?Patient sitting up in bed, feeling better.  Pain is controlled ? ?Objective: ?Vital signs in last 24 hours: ?Temp:  [97.7 ?F (36.5 ?C)-98.6 ?F (37 ?C)] 97.7 ?F (36.5 ?C) (03/17 6606) ?Pulse Rate:  [85-113] 85 (03/17 0338) ?Cardiac Rhythm: Normal sinus rhythm (03/16 2000) ?Resp:  [16-21] 20 (03/17 0338) ?BP: (92-111)/(62-78) 101/69 (03/17 3016) ?SpO2:  [92 %-99 %] 92 % (03/17 0338) ? ?Intake/Output from previous day: ?03/16 0701 - 03/17 0700 ?In: 2628.8 [P.O.:1880; I.V.:748.8] ?Out: 0109 [Urine:1800; Chest Tube:840] ?Intake/Output this shift: ?Total I/O ?In: -  ?Out: 600 [Urine:600] ? ?General appearance: alert, cooperative, and no distress ?Heart: regular rate and rhythm ?Lungs: clear to auscultation bilaterally ?Abdomen: soft, non-tender; bowel sounds normal; no masses,  no organomegaly ?Extremities: extremities normal, atraumatic, no cyanosis or edema ?Wound: clean and dry, sub q air present ? ?Lab Results: ?Recent Labs  ?  06/28/21 ?0119 06/29/21 ?0113  ?WBC 15.8* 19.6*  ?HGB 9.7* 9.8*  ?HCT 29.4* 29.9*  ?PLT 256 278  ? ?BMET:  ?Recent Labs  ?  06/28/21 ?0119 06/29/21 ?0113  ?NA 133* 138  ?K 5.0 4.2  ?CL 100 103  ?CO2 23 20*  ?GLUCOSE 125* 88  ?BUN 23* 14  ?CREATININE 1.45* 0.93  ?CALCIUM 8.4* 8.5*  ?  ?PT/INR: No results for input(s): LABPROT, INR in the last 72 hours. ?ABG ?   ?Component Value Date/Time  ? PHART 7.43 06/25/2021 1010  ? HCO3 23.9 06/25/2021 1010  ? ACIDBASEDEF 0.1 06/25/2021 1010  ? O2SAT 98.5 06/25/2021 1010  ? ?CBG (last 3)  ?No results for input(s): GLUCAP in the last 72 hours. ? ?Assessment/Plan: ?S/P Procedure(s) (LRB): ?VIDEO ASSISTED THORACOSCOPY (VATS), Drainage of RIGHT pleural effusion/ DECORTICATION (Right) ? ?CV- hemodynamically stable in  NSR ?Pulm- large air leak persists, CXR with increase in sub q emphysema, basilar pneumothorax persists.. will try on water seal today  ?Renal- creatinine has improved to normal at 0.93, will stop IV fluids ?Lovenox for DVT prophylaxis ?Pathology remains pending ?Dispo- patient stable, remains in NSR, CT with > 800 cc output with + air leak, sub q emphysema present today, will try to water seal, creatinine has returned to normal limits, stop IV fluids ? ? LOS: 2 days  ? ? ?Ellwood Handler, PA-C ?06/29/2021 ? ?Patient seen and examined.  ?Path adenocarcinoma, stage IV with malignant pleural effusion ?Tissue sent for molecular testing and PDL1 ? ?Revonda Standard Roxan Hockey, MD ?[facility] ?[phone] ? ? ?

## 2021-06-30 ENCOUNTER — Inpatient Hospital Stay (HOSPITAL_COMMUNITY): Payer: Commercial Managed Care - PPO

## 2021-06-30 NOTE — Progress Notes (Addendum)
? ?   ?  [address] ?      [place] [address] ?            [phone]   ? ?  ? ?3 Days Post-Op Procedure(s) (LRB): ?VIDEO ASSISTED THORACOSCOPY (VATS), Drainage of RIGHT pleural effusion/ DECORTICATION (Right) ? ?Subjective: ?Patient without specific complaint this am. His pain is fairly well controlled. He walks several times a day ? ?Objective: ?Vital signs in last 24 hours: ?Temp:  [98 ?F (36.7 ?C)-98.7 ?F (37.1 ?C)] 98.3 ?F (36.8 ?C) (03/18 0749) ?Pulse Rate:  [88-100] 100 (03/18 0749) ?Cardiac Rhythm: Sinus tachycardia (03/18 0737) ?Resp:  [15-20] 17 (03/18 0749) ?BP: (96-124)/(65-78) 103/67 (03/18 0749) ?SpO2:  [93 %-100 %] 96 % (03/18 0749) ?Weight:  [90.8 kg] 90.8 kg (03/18 0443) ? ?  ? ?Intake/Output from previous day: ?03/17 0701 - 03/18 0700 ?In: 960 [P.O.:960] ?Out: 1121 [Urine:600; Stool:1; Chest Tube:520] ? ? ?Physical Exam: ? ?Cardiovascular: RRR ?Pulmonary: Coarse on the right and clear on the left ?Abdomen: Soft, non tender, bowel sounds present. ?Extremities: No LE edema ?Wounds: Clean and dry.  No erythema or signs of infection. ?Chest Tubes: to water seal, +++ air leak with cough ? ?Lab Results: ?CBC: ?Recent Labs  ?  06/28/21 ?0119 06/29/21 ?0113  ?WBC 15.8* 19.6*  ?HGB 9.7* 9.8*  ?HCT 29.4* 29.9*  ?PLT 256 278  ? ?BMET:  ?Recent Labs  ?  06/28/21 ?0119 06/29/21 ?0113  ?NA 133* 138  ?K 5.0 4.2  ?CL 100 103  ?CO2 23 20*  ?GLUCOSE 125* 88  ?BUN 23* 14  ?CREATININE 1.45* 0.93  ?CALCIUM 8.4* 8.5*  ?  ?PT/INR: No results for input(s): LABPROT, INR in the last 72 hours. ?ABG:  ?INR: ?Will add last result for INR, ABG once components are confirmed ?Will add last 4 CBG results once components are confirmed ? ?Assessment/Plan: ? ?1. CV - SR ?2.  Pulmonary - On room air. Chest tube with 520 cc last 24 hours. Chest tubes were placed to water seal yesterday Chest tube with +++ air leak with cough . CXR this am appears to show right chest wall emphysema extending into neck, small right  pneumothorax. Chest tubes to remain for now. Check CXR in am. Encourage incentive spirometer. ?3. On Lovenox for DVT prophylaxis ? ?Sharalyn Ink ZimmermanPA-C ?06/30/2021,8:51 AM ?[phone]  ? ?Patient seen and examined, agree with above ?Will dc posterior tube, leave anterior tube to water seal ?Ambulating well ?SCD + enoxaparin ? ?Revonda Standard Roxan Hockey, MD ?[facility] ?[phone] ? ?

## 2021-06-30 NOTE — Plan of Care (Signed)

## 2021-06-30 NOTE — Progress Notes (Signed)
Mobility Specialist Progress Note ? ? 06/30/21 1040  ?Mobility  ?Activity Ambulated independently in hallway  ?Level of Assistance Independent after set-up  ?Assistive Device None  ?Distance Ambulated (ft) 820 ft  ?Activity Response Tolerated well  ?$Mobility charge 1 Mobility  ? ?Received pt on EOB having no complaints and agreeable to mobility. Asymptomatic throughout ambulation, returned back to EOB w/ call bell in reach and family in the room. ? ?Holland Falling ?Mobility Specialist ?Phone Number [phone] ? ?

## 2021-07-01 ENCOUNTER — Inpatient Hospital Stay (HOSPITAL_COMMUNITY): Payer: Commercial Managed Care - PPO

## 2021-07-01 MED ORDER — IOHEXOL 350 MG/ML SOLN
80.0000 mL | Freq: Once | INTRAVENOUS | Status: AC | PRN
  Administered 2021-07-01: 80 mL via INTRAVENOUS

## 2021-07-01 NOTE — Progress Notes (Signed)
Patient via wheelchair to CT in stable condition ?

## 2021-07-01 NOTE — Plan of Care (Signed)
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
## 2021-07-02 ENCOUNTER — Inpatient Hospital Stay (HOSPITAL_COMMUNITY): Payer: Commercial Managed Care - PPO

## 2021-07-02 LAB — AEROBIC/ANAEROBIC CULTURE W GRAM STAIN (SURGICAL/DEEP WOUND)
Culture: NO GROWTH
Culture: NO GROWTH
Gram Stain: NONE SEEN
Gram Stain: NONE SEEN

## 2021-07-02 NOTE — Plan of Care (Signed)

## 2021-07-02 NOTE — Progress Notes (Addendum)
? ?   ?[address] ?      [place] [address] ?            [phone]   ? ?  5 Days Post-Op Procedure(s) (LRB): ?VIDEO ASSISTED THORACOSCOPY (VATS), Drainage of RIGHT pleural effusion/ DECORTICATION (Right) ?Subjective: ?Feeling better, got some sleep last night ? ?Objective: ?Vital signs in last 24 hours: ?Temp:  [97.9 ?F (36.6 ?C)-98.5 ?F (36.9 ?C)] 97.9 ?F (36.6 ?C) (03/20 0175) ?Pulse Rate:  [85-99] 99 (03/20 0817) ?Cardiac Rhythm: Normal sinus rhythm (03/20 0753) ?Resp:  [14-20] 14 (03/20 0817) ?BP: (98-110)/(68-76) 98/68 (03/20 1025) ?SpO2:  [93 %-100 %] 93 % (03/20 0817) ? ?Hemodynamic parameters for last 24 hours: ?  ? ?Intake/Output from previous day: ?03/19 0701 - 03/20 0700 ?In: -  ?Out: [place] ?Intake/Output this shift: ?No intake/output data recorded. ? ?General appearance: alert, cooperative, and no distress ?Heart: regular rate and rhythm ?Lungs: coarse on right with + SQ air ?Abdomen: benign ?Extremities: no edema or calf tenderness ?Wound: healing well ? ?Lab Results: ?No results for input(s): WBC, HGB, HCT, PLT in the last 72 hours. ?BMET: No results for input(s): NA, K, CL, CO2, GLUCOSE, BUN, CREATININE, CALCIUM in the last 72 hours.  ?PT/INR: No results for input(s): LABPROT, INR in the last 72 hours. ?ABG ?   ?Component Value Date/Time  ? PHART 7.43 06/25/2021 1010  ? HCO3 23.9 06/25/2021 1010  ? ACIDBASEDEF 0.1 06/25/2021 1010  ? O2SAT 98.5 06/25/2021 1010  ? ?CBG (last 3)  ?No results for input(s): GLUCAP in the last 72 hours. ? ?Meds ?Scheduled Meds: ? acetaminophen  1,000 mg Oral Q6H  ? Or  ? acetaminophen (TYLENOL) oral liquid 160 mg/5 mL  1,000 mg Oral Q6H  ? atorvastatin  40 mg Oral Daily  ? bisacodyl  10 mg Oral Daily  ? enoxaparin (LOVENOX) injection  40 mg Subcutaneous Daily  ? loratadine  10 mg Oral QPM  ? pantoprazole  40 mg Oral Daily  ? senna-docusate  1 tablet Oral QHS  ? ?Continuous Infusions: ?PRN Meds:.albuterol, ALPRAZolam, fentaNYL  (SUBLIMAZE) injection, ondansetron (ZOFRAN) IV, oxyCODONE, traMADol ? ?Xrays ?CT HEAD W & WO CONTRAST (5MM) ? ?Result Date: 07/01/2021 ?CLINICAL DATA:  51 year old male status post VATS, decortication and pleural biopsies 3 days ago for recurrent effusion. Non-small cell lung cancer staging. EXAM: CT HEAD WITHOUT AND WITH CONTRAST TECHNIQUE: Contiguous axial images were obtained from the base of the skull through the vertex without and with intravenous contrast. RADIATION DOSE REDUCTION: This exam was performed according to the departmental dose-optimization program which includes automated exposure control, adjustment of the mA and/or kV according to patient size and/or use of iterative reconstruction technique. CONTRAST:  67mL OMNIPAQUE IOHEXOL 350 MG/ML SOLN COMPARISON:  CT cervical spine 05/19/2020. FINDINGS: Brain: Cerebral volume is within normal limits for age. No midline shift, ventriculomegaly, mass effect, evidence of mass lesion, intracranial hemorrhage or evidence of cortically based acute infarction. Gray-white matter differentiation is within normal limits throughout the brain. No abnormal enhancement identified. Vascular: The major intracranial vascular structures are enhancing as expected. Skull: No acute or suspicious osseous lesion identified. Sinuses/Orbits: Visualized paranasal sinuses and mastoids are clear. Other: Visualized orbits and scalp soft tissues are within normal limits., there is a trace amount of soft tissue gas tracking in the upper right sternocleidomastoid muscle which is likely from the recent right chest surgery. IMPRESSION: 1.  Normal CT appearance of the brain. 2. Trace soft tissue gas in  the upper right sternocleidomastoid muscle most likely from the recent right chest surgery. Electronically Signed   By: Genevie Ann M.D.   On: 07/01/2021 10:46  ? ?DG CHEST PORT 1 VIEW ? ?Result Date: 07/01/2021 ?CLINICAL DATA:  Pleural effusion. EXAM: PORTABLE CHEST 1 VIEW COMPARISON:  June 30, 2021 FINDINGS: Significant right chest wall emphysema is stable in the interval. One of the right chest tubes have been removed while the other is stable. The right lateral pneumothorax is stable. Postoperative changes with opacity over the right base is stable. The cardiomediastinal silhouette is unchanged. Probable mild mediastinal air, likely due to the chest wall emphysematous changes. The left lung is clear. No left-sided pneumothorax. No other changes. IMPRESSION: 1. 1 of the 2 right chest tubes has been removed while the other is stable. The right lateral pneumothorax is stable. 2. Significant chest wall emphysematous change, similar in the interval. There may be a small amount of mediastinal air secondary to the chest wall emphysematous change. 3. Stable opacity in the right base. 4. The left lung remains clear. The mediastinum and heart are unchanged. Electronically Signed   By: Dorise Bullion III M.D.   On: 07/01/2021 10:00   ? ?Assessment/Plan: ?S/P Procedure(s) (LRB): ?VIDEO ASSISTED THORACOSCOPY (VATS), Drainage of RIGHT pleural effusion/ DECORTICATION (Right) ? ?1 afeb, vss, sinus rhythm ?2 sats ok on RA ?3 CT 190 cc, + air leak ?4 CXR - stable appearance c/w most recent study ?5 no new labs ?6 normal brain CT ?7 lovenox for DVT PPx ?8 routine pulm hygiene, rehab ? ? ? LOS: 5 days  ? ? ?John Giovanni PA-C ?Pager [phone] ?07/02/2021 ?  ?Patient seen and examined, agree with above ?Still has an air leak albeit slowly decreasing ?CT head- no mets ? ?Revonda Standard Roxan Hockey, MD ?[facility] ?[phone] ?

## 2021-07-03 ENCOUNTER — Inpatient Hospital Stay (HOSPITAL_COMMUNITY): Payer: Commercial Managed Care - PPO

## 2021-07-03 LAB — BASIC METABOLIC PANEL
Anion gap: 9 (ref 5–15)
BUN: 10 mg/dL (ref 6–20)
CO2: 24 mmol/L (ref 22–32)
Calcium: 8.6 mg/dL — ABNORMAL LOW (ref 8.9–10.3)
Chloride: 102 mmol/L (ref 98–111)
Creatinine, Ser: 0.91 mg/dL (ref 0.61–1.24)
GFR, Estimated: >60 mL/min (ref >60)
Glucose, Bld: 83 mg/dL (ref 70–99)
Potassium: 3.9 mmol/L (ref 3.5–5.1)
Sodium: 135 mmol/L (ref 135–145)

## 2021-07-03 LAB — CBC
HCT: 27.8 % — ABNORMAL LOW (ref 39.0–52.0)
Hemoglobin: 9.2 g/dL — ABNORMAL LOW (ref 13.0–17.0)
MCH: 28.7 pg (ref 26.0–34.0)
MCHC: 33.1 g/dL (ref 30.0–36.0)
MCV: 86.6 fL (ref 80.0–100.0)
Platelets: 275 10*3/uL (ref 150–400)
RBC: 3.21 MIL/uL — ABNORMAL LOW (ref 4.22–5.81)
RDW: 13.7 % (ref 11.5–15.5)
WBC: 12.2 10*3/uL — ABNORMAL HIGH (ref 4.0–10.5)
nRBC: 0 % (ref 0.0–0.2)

## 2021-07-03 NOTE — Progress Notes (Signed)
Pt endorsed tenderness on right forearm, previous IV site. Assessment reveals erythema, warmth, slight swelling all starting at IV stick site, and progressing up the arm approximately 11cm. Boarders marked. Pt stable, vitals signs WDL, afebrile. Gold PA-C notified for non-urgent callback.  ?

## 2021-07-03 NOTE — Progress Notes (Addendum)
? ?   ?[address] ?      [place] [address] ?            [phone]   ? ?  6 Days Post-Op Procedure(s) (LRB): ?VIDEO ASSISTED THORACOSCOPY (VATS), Drainage of RIGHT pleural effusion/ DECORTICATION (Right) ?Subjective: ?Less pain today ? ?Objective: ?Vital signs in last 24 hours: ?Temp:  [97.9 ?F (36.6 ?C)-98.9 ?F (37.2 ?C)] 98.2 ?F (36.8 ?C) (03/21 0759) ?Pulse Rate:  [89-107] 107 (03/21 0759) ?Cardiac Rhythm: Sinus tachycardia (03/21 0726) ?Resp:  [14-18] 16 (03/21 0759) ?BP: (92-113)/(65-75) 92/65 (03/21 0759) ?SpO2:  [93 %-97 %] 96 % (03/21 0759) ? ?Hemodynamic parameters for last 24 hours: ?  ? ?Intake/Output from previous day: ?03/20 0701 - 03/21 0700 ?In: 600 [P.O.:600] ?Out: 300 [Chest Tube:300] ?Intake/Output this shift: ?No intake/output data recorded. ? ?General appearance: alert, cooperative, and no distress ?Heart: regular rate and rhythm ?Lungs: dim on right lower fields ?Abdomen: benign ?Extremities: no edema or calf tenderness ?Wound: incis healing well ? ?Lab Results: ?Recent Labs  ?  07/03/21 ?0017  ?WBC 12.2*  ?HGB 9.2*  ?HCT 27.8*  ?PLT 275  ? ?BMET:  ?Recent Labs  ?  07/03/21 ?4944  ?NA 135  ?K 3.9  ?CL 102  ?CO2 24  ?GLUCOSE 83  ?BUN 10  ?CREATININE 0.91  ?CALCIUM 8.6*  ?  ?PT/INR: No results for input(s): LABPROT, INR in the last 72 hours. ?ABG ?   ?Component Value Date/Time  ? PHART 7.43 06/25/2021 1010  ? HCO3 23.9 06/25/2021 1010  ? ACIDBASEDEF 0.1 06/25/2021 1010  ? O2SAT 98.5 06/25/2021 1010  ? ?CBG (last 3)  ?No results for input(s): GLUCAP in the last 72 hours. ? ?Meds ?Scheduled Meds: ? atorvastatin  40 mg Oral Daily  ? bisacodyl  10 mg Oral Daily  ? enoxaparin (LOVENOX) injection  40 mg Subcutaneous Daily  ? loratadine  10 mg Oral QPM  ? pantoprazole  40 mg Oral Daily  ? senna-docusate  1 tablet Oral QHS  ? ?Continuous Infusions: ?PRN Meds:.albuterol, ALPRAZolam, fentaNYL (SUBLIMAZE) injection, ondansetron (ZOFRAN) IV, oxyCODONE, traMADol ? ?Xrays ?CT HEAD W & WO  CONTRAST (5MM) ? ?Result Date: 07/01/2021 ?CLINICAL DATA:  51 year old male status post VATS, decortication and pleural biopsies 3 days ago for recurrent effusion. Non-small cell lung cancer staging. EXAM: CT HEAD WITHOUT AND WITH CONTRAST TECHNIQUE: Contiguous axial images were obtained from the base of the skull through the vertex without and with intravenous contrast. RADIATION DOSE REDUCTION: This exam was performed according to the departmental dose-optimization program which includes automated exposure control, adjustment of the mA and/or kV according to patient size and/or use of iterative reconstruction technique. CONTRAST:  55mL OMNIPAQUE IOHEXOL 350 MG/ML SOLN COMPARISON:  CT cervical spine 05/19/2020. FINDINGS: Brain: Cerebral volume is within normal limits for age. No midline shift, ventriculomegaly, mass effect, evidence of mass lesion, intracranial hemorrhage or evidence of cortically based acute infarction. Gray-white matter differentiation is within normal limits throughout the brain. No abnormal enhancement identified. Vascular: The major intracranial vascular structures are enhancing as expected. Skull: No acute or suspicious osseous lesion identified. Sinuses/Orbits: Visualized paranasal sinuses and mastoids are clear. Other: Visualized orbits and scalp soft tissues are within normal limits., there is a trace amount of soft tissue gas tracking in the upper right sternocleidomastoid muscle which is likely from the recent right chest surgery. IMPRESSION: 1.  Normal CT appearance of the brain. 2. Trace soft tissue gas in the upper right sternocleidomastoid muscle most  likely from the recent right chest surgery. Electronically Signed   By: Genevie Ann M.D.   On: 07/01/2021 10:46  ? ?DG Chest Port 1 View ? ?Result Date: 07/02/2021 ?CLINICAL DATA:  Shortness of breath following VATS, decortication and chest tube placement. EXAM: PORTABLE CHEST 1 VIEW COMPARISON:  July 01, 2021. FINDINGS: EKG leads project  over the patient's chest. A RIGHT-sided chest tube remains in place. Persistent RIGHT-sided pneumothorax and extensive RIGHT-sided subcutaneous emphysema. These findings show no substantial change compared to the recent comparison study. RIGHT basilar opacity persists without change. LEFT chest is clear aside from minimal LEFT basilar atelectasis. On limited assessment there is no acute skeletal finding. IMPRESSION: Stable radiographic appearance of RIGHT-sided pneumothorax and associated subcutaneous emphysema with RIGHT chest tube in place. Stable RIGHT basilar opacity. Electronically Signed   By: Zetta Bills M.D.   On: 07/02/2021 08:19   ? ?Assessment/Plan: ?S/P Procedure(s) (LRB): ?VIDEO ASSISTED THORACOSCOPY (VATS), Drainage of RIGHT pleural effusion/ DECORTICATION (Right) ? ?1 afeb, VSS, sinus rhythm ?2 sats good on RA ?3 CT 300 cc, less air leak but present ?4 CXR - decreased pntx to my read, o/w ASD/sub q air is stable ?5 conts current management, rehab/pulm hygiene ? ? ? LOS: 6 days  ? ? ?John Giovanni PA-C ?pa ?07/03/2021 ?  ?Patient seen and examined, agree with above ?Space definitely looks smaller, but air leak still present ?Will try on MiniXpress ?Possibly home with tube ? ?Revonda Standard Roxan Hockey, MD ?[facility] ?[phone] ? ?

## 2021-07-03 NOTE — Plan of Care (Signed)

## 2021-07-03 NOTE — Plan of Care (Signed)
?  Problem: Education: ?Goal: Knowledge of disease or condition will improve ?07/03/2021 2039 by Tom-Johnson, Betti Cruz, RN ?Outcome: Progressing ?07/03/2021 0744 by Zachery Dauer, RN ?Outcome: Progressing ?Goal: Knowledge of the prescribed therapeutic regimen will improve ?07/03/2021 2039 by Tom-Johnson, Betti Cruz, RN ?Outcome: Progressing ?07/03/2021 0744 by Zachery Dauer, RN ?Outcome: Progressing ?  ?Problem: Cardiac: ?Goal: Will achieve and/or maintain hemodynamic stability ?Outcome: Progressing ?  ?Problem: Clinical Measurements: ?Goal: Postoperative complications will be avoided or minimized ?Outcome: Progressing ?  ?Problem: Respiratory: ?Goal: Respiratory status will improve ?Outcome: Progressing ?  ?Problem: Clinical Measurements: ?Goal: Respiratory complications will improve ?Outcome: Progressing ?Goal: Cardiovascular complication will be avoided ?Outcome: Progressing ?  ?Problem: Activity: ?Goal: Risk for activity intolerance will decrease ?Outcome: Progressing ?  ?Problem: Nutrition: ?Goal: Adequate nutrition will be maintained ?Outcome: Progressing ?  ?Problem: Coping: ?Goal: Level of anxiety will decrease ?Outcome: Progressing ?  ?

## 2021-07-03 NOTE — Plan of Care (Signed)
?  Problem: Education: ?Goal: Knowledge of disease or condition will improve ?07/03/2021 2040 by Tom-Johnson, Betti Cruz, RN ?Outcome: Progressing ?07/03/2021 2039 by Tom-Johnson, Betti Cruz, RN ?Outcome: Progressing ?07/03/2021 0744 by Zachery Dauer, RN ?Outcome: Progressing ?Goal: Knowledge of the prescribed therapeutic regimen will improve ?07/03/2021 2040 by Tom-Johnson, Betti Cruz, RN ?Outcome: Progressing ?07/03/2021 2039 by Tom-Johnson, Betti Cruz, RN ?Outcome: Progressing ?07/03/2021 0744 by Zachery Dauer, RN ?Outcome: Progressing ?  ?Problem: Cardiac: ?Goal: Will achieve and/or maintain hemodynamic stability ?Outcome: Progressing ?  ?Problem: Clinical Measurements: ?Goal: Postoperative complications will be avoided or minimized ?Outcome: Progressing ?  ?Problem: Respiratory: ?Goal: Respiratory status will improve ?Outcome: Progressing ?  ?Problem: Clinical Measurements: ?Goal: Respiratory complications will improve ?Outcome: Progressing ?Goal: Cardiovascular complication will be avoided ?Outcome: Progressing ?  ?Problem: Activity: ?Goal: Risk for activity intolerance will decrease ?Outcome: Progressing ?  ?Problem: Nutrition: ?Goal: Adequate nutrition will be maintained ?Outcome: Progressing ?  ?Problem: Coping: ?Goal: Level of anxiety will decrease ?Outcome: Progressing ?  ?Problem: Safety: ?Goal: Ability to remain free from injury will improve ?Outcome: Progressing ?  ?Problem: Skin Integrity: ?Goal: Risk for impaired skin integrity will decrease ?Outcome: Progressing ?  ?

## 2021-07-03 NOTE — Plan of Care (Signed)
?  Problem: Education: ?Goal: Knowledge of disease or condition will improve ?Outcome: Progressing ?Goal: Knowledge of the prescribed therapeutic regimen will improve ?Outcome: Progressing ?  ?Problem: Cardiac: ?Goal: Will achieve and/or maintain hemodynamic stability ?Outcome: Progressing ?  ?Problem: Clinical Measurements: ?Goal: Postoperative complications will be avoided or minimized ?Outcome: Progressing ?  ?Problem: Respiratory: ?Goal: Respiratory status will improve ?Outcome: Progressing ?  ?

## 2021-07-04 ENCOUNTER — Other Ambulatory Visit: Payer: Self-pay | Admitting: Thoracic Surgery (Cardiothoracic Vascular Surgery)

## 2021-07-04 ENCOUNTER — Inpatient Hospital Stay (HOSPITAL_COMMUNITY): Payer: Commercial Managed Care - PPO

## 2021-07-04 DIAGNOSIS — J90 Pleural effusion, not elsewhere classified: Secondary | ICD-10-CM

## 2021-07-04 DIAGNOSIS — C3490 Malignant neoplasm of unspecified part of unspecified bronchus or lung: Secondary | ICD-10-CM

## 2021-07-04 MED ORDER — OXYCODONE HCL 5 MG PO TABS: 5.0000 mg | ORAL_TABLET | Freq: Four times a day (QID) | ORAL | 0 refills | Status: DC | PRN

## 2021-07-04 NOTE — Plan of Care (Signed)
?Problem: Education: ?Goal: Knowledge of disease or condition will improve ?07/04/2021 1250 by Leonie Man, RN ?Outcome: Adequate for Discharge ?07/04/2021 1005 by Leonie Man, RN ?Outcome: Progressing ?Goal: Knowledge of the prescribed therapeutic regimen will improve ?07/04/2021 1250 by Leonie Man, RN ?Outcome: Adequate for Discharge ?07/04/2021 1005 by Leonie Man, RN ?Outcome: Progressing ?  ?Problem: Activity: ?Goal: Risk for activity intolerance will decrease ?07/04/2021 1250 by Leonie Man, RN ?Outcome: Adequate for Discharge ?07/04/2021 1005 by Leonie Man, RN ?Outcome: Progressing ?  ?Problem: Cardiac: ?Goal: Will achieve and/or maintain hemodynamic stability ?07/04/2021 1250 by Leonie Man, RN ?Outcome: Adequate for Discharge ?07/04/2021 1005 by Leonie Man, RN ?Outcome: Progressing ?  ?Problem: Clinical Measurements: ?Goal: Postoperative complications will be avoided or minimized ?07/04/2021 1250 by Leonie Man, RN ?Outcome: Adequate for Discharge ?07/04/2021 1005 by Leonie Man, RN ?Outcome: Progressing ?  ?Problem: Respiratory: ?Goal: Respiratory status will improve ?07/04/2021 1250 by Leonie Man, RN ?Outcome: Adequate for Discharge ?07/04/2021 1005 by Leonie Man, RN ?Outcome: Progressing ?  ?Problem: Pain Management: ?Goal: Pain level will decrease ?07/04/2021 1250 by Leonie Man, RN ?Outcome: Adequate for Discharge ?07/04/2021 1005 by Leonie Man, RN ?Outcome: Progressing ?  ?Problem: Skin Integrity: ?Goal: Wound healing without signs and symptoms infection will improve ?07/04/2021 1250 by Leonie Man, RN ?Outcome: Adequate for Discharge ?07/04/2021 1005 by Leonie Man, RN ?Outcome: Progressing ?  ?Problem: Education: ?Goal: Knowledge of General Education information will improve ?Description: Including pain rating scale, medication(s)/side effects and non-pharmacologic comfort measures ?07/04/2021 1250 by Leonie Man, RN ?Outcome: Adequate for Discharge ?07/04/2021 1005 by Leonie Man, RN ?Outcome: Progressing ?  ?Problem: Health Behavior/Discharge Planning: ?Goal: Ability to manage health-related needs will improve ?07/04/2021 1250 by Leonie Man, RN ?Outcome: Adequate for Discharge ?07/04/2021 1005 by Leonie Man, RN ?Outcome: Progressing ?  ?Problem: Clinical Measurements: ?Goal: Ability to maintain clinical measurements within normal limits will improve ?07/04/2021 1250 by Leonie Man, RN ?Outcome: Adequate for Discharge ?07/04/2021 1005 by Leonie Man, RN ?Outcome: Progressing ?Goal: Will remain free from infection ?07/04/2021 1250 by Leonie Man, RN ?Outcome: Adequate for Discharge ?07/04/2021 1005 by Leonie Man, RN ?Outcome: Progressing ?Goal: Diagnostic test results will improve ?07/04/2021 1250 by Leonie Man, RN ?Outcome: Adequate for Discharge ?07/04/2021 1005 by Leonie Man, RN ?Outcome: Progressing ?Goal: Respiratory complications will improve ?07/04/2021 1250 by Leonie Man, RN ?Outcome: Adequate for Discharge ?07/04/2021 1005 by Leonie Man, RN ?Outcome: Progressing ?Goal: Cardiovascular complication will be avoided ?07/04/2021 1250 by Leonie Man, RN ?Outcome: Adequate for Discharge ?07/04/2021 1005 by Leonie Man, RN ?Outcome: Progressing ?  ?Problem: Activity: ?Goal: Risk for activity intolerance will decrease ?07/04/2021 1250 by Leonie Man, RN ?Outcome: Adequate for Discharge ?07/04/2021 1005 by Leonie Man, RN ?Outcome: Progressing ?  ?Problem: Nutrition: ?Goal: Adequate nutrition will be maintained ?07/04/2021 1250 by Leonie Man, RN ?Outcome: Adequate for Discharge ?07/04/2021 1005 by Leonie Man, RN ?Outcome: Progressing ?  ?Problem: Coping: ?Goal: Level of anxiety will decrease ?07/04/2021 1250 by Leonie Man, RN ?Outcome: Adequate for Discharge ?07/04/2021 1005 by Leonie Man, RN ?Outcome: Progressing ?  ?Problem: Elimination: ?Goal: Will not experience complications related to bowel motility ?07/04/2021 1250 by Leonie Man, RN ?Outcome:  Adequate for Discharge ?07/04/2021 1005 by Leonie Man, RN ?Outcome: Progressing ?Goal: Will not experience complications related to urinary retention ?07/04/2021  1250 by Leonie Man, RN ?Outcome: Adequate for Discharge ?07/04/2021 1005 by Leonie Man, RN ?Outcome: Progressing ?  ?Problem: Pain Managment: ?Goal: General experience of comfort will improve ?07/04/2021 1250 by Leonie Man, RN ?Outcome: Adequate for Discharge ?07/04/2021 1005 by Leonie Man, RN ?Outcome: Progressing ?  ?Problem: Safety: ?Goal: Ability to remain free from injury will improve ?07/04/2021 1250 by Leonie Man, RN ?Outcome: Adequate for Discharge ?07/04/2021 1005 by Leonie Man, RN ?Outcome: Progressing ?  ?Problem: Skin Integrity: ?Goal: Risk for impaired skin integrity will decrease ?07/04/2021 1250 by Leonie Man, RN ?Outcome: Adequate for Discharge ?07/04/2021 1005 by Leonie Man, RN ?Outcome: Progressing ?  ?

## 2021-07-04 NOTE — Discharge Instructions (Signed)
Please have patient watch video on Youtube:[facility] mini express. [facility] patient education ? ?General Patient Instructions: ? ?If the tube becomes disconnected, reconnect it immediately and tape it securely. To reconnect the tube: ?Pinch the chest tube with your fingers to close the chest tube until you can re attach it. If you are able, clean the ends of the chest tube and drain with an alcohol swab before reattaching.  ? ?If the tube becomes disconnected AND you cannot put it back together, go to closest Emergency Department ? ?Please do NOT allow the mini express chamber to become full. ?To empty the fluid from the chamber: ?First, wash your hands with soap and water. Use a Luer lock syringe (provided by hospital or home health, if arranged) to attach to the front port (twist Luer lock syringe clock wise to to tighten) at the bottom of the mini express. Pull the syringe plunger back, unscrew the syringe and put fluid into the toilet. You may repeat as necessary. If it becomes difficult to empty the fluid in the mini express, squirt water through the port (where syringe attaches to) to flush out the blockage. If this does not work, call the office . ? ?Try to keep mini express upright and below the level of your heart. Also, check periodically that there are no kinks in the tubing.  ? ?Changing the chest tube dressing: ?Please change the dressing at least every other day or if it becomes wet. You will be taught how to change the dressing before you are discharged from the hospital or home health will be arranged to do it for you.  ? ?If the chest falls out or gets pulled out: ?Place a piece of gauze over the site and cover the gauze with tape. Contact our office ASAP [phone]) ?If you have chest pain or sudden onset of shortness of breath, go to the nearest emergency room  ?

## 2021-07-04 NOTE — Progress Notes (Signed)
? ?   ?  [address] ?      [place] [address] ?            [phone]   ? ?  7 Days Post-Op Procedure(s) (LRB): ?VIDEO ASSISTED THORACOSCOPY (VATS), Drainage of RIGHT pleural effusion/ DECORTICATION (Right) ?Subjective: ?Feels well overall ? ?Objective: ?Vital signs in last 24 hours: ?Temp:  [98 ?F (36.7 ?C)-98.8 ?F (37.1 ?C)] 98.8 ?F (37.1 ?C) (03/22 9357) ?Pulse Rate:  [86-107] 86 (03/22 0332) ?Cardiac Rhythm: Normal sinus rhythm (03/21 1900) ?Resp:  [15-20] 17 (03/22 0332) ?BP: (92-121)/(65-81) 111/72 (03/22 0177) ?SpO2:  [94 %-96 %] 96 % (03/22 0332) ? ?Hemodynamic parameters for last 24 hours: ?  ? ?Intake/Output from previous day: ?03/21 0701 - 03/22 0700 ?In: -  ?Out: [facility] ?Intake/Output this shift: ?No intake/output data recorded. ? ?General appearance: alert, cooperative, and no distress ?Heart: regular rate and rhythm ?Lungs: min dim in right base, + sub q air  ?Abdomen: benign ?Extremities: no edema/calf tenderness ?Wound: incis healing well ? ?Lab Results: ?Recent Labs  ?  07/03/21 ?9390  ?WBC 12.2*  ?HGB 9.2*  ?HCT 27.8*  ?PLT 275  ? ?BMET:  ?Recent Labs  ?  07/03/21 ?3009  ?NA 135  ?K 3.9  ?CL 102  ?CO2 24  ?GLUCOSE 83  ?BUN 10  ?CREATININE 0.91  ?CALCIUM 8.6*  ?  ?PT/INR: No results for input(s): LABPROT, INR in the last 72 hours. ?ABG ?   ?Component Value Date/Time  ? PHART 7.43 06/25/2021 1010  ? HCO3 23.9 06/25/2021 1010  ? ACIDBASEDEF 0.1 06/25/2021 1010  ? O2SAT 98.5 06/25/2021 1010  ? ?CBG (last 3)  ?No results for input(s): GLUCAP in the last 72 hours. ? ?Meds ?Scheduled Meds: ? atorvastatin  40 mg Oral Daily  ? bisacodyl  10 mg Oral Daily  ? enoxaparin (LOVENOX) injection  40 mg Subcutaneous Daily  ? loratadine  10 mg Oral QPM  ? pantoprazole  40 mg Oral Daily  ? senna-docusate  1 tablet Oral QHS  ? ?Continuous Infusions: ?PRN Meds:.albuterol, ALPRAZolam, fentaNYL (SUBLIMAZE) injection, ondansetron (ZOFRAN) IV, oxyCODONE, traMADol ? ?Xrays ?DG Chest Port 1  View ? ?Result Date: 07/03/2021 ?CLINICAL DATA:  Reason for exam: follow up examPatient reports sob and chest pains feel better today. Hx of VATS/decortication right 06/27/2021. EXAM: PORTABLE CHEST 1 VIEW COMPARISON:  July 02, 2021. FINDINGS: Similar right-sided pneumothorax. Right chest tube is in similar position. Similar extensive subcutaneous emphysema along the right chest wall and right lower neck. Similar right basilar opacities. Left lung remains largely clear. Similar cardiomediastinal silhouette. IMPRESSION: 1. Similar right-sided pneumothorax with similar position of a right chest tube. Similar subcutaneous emphysema. 2. Similar right basilar opacities. Electronically Signed   By: Margaretha Sheffield M.D.   On: 07/03/2021 08:19   ? ?Assessment/Plan: ?S/P Procedure(s) (LRB): ?VIDEO ASSISTED THORACOSCOPY (VATS), Drainage of RIGHT pleural effusion/ DECORTICATION (Right) ?Pod#7 ? ?1 afeb, VSS s BP 90-120's ?2 sats good on RA ?3 CXR - similar appearance, mini express in place, 270 cc recorded yesterday, + air leak ?4 no new labs ?5 probable d/c , will d/w MD ? ? ? ? ? LOS: 7 days  ? ? ?John Giovanni pa-c ?PAGER [phone] ?07/04/2021 ?  ?

## 2021-07-04 NOTE — TOC Transition Note (Signed)
Transition of Care (TOC) - CM/SW Discharge Note ? ? ?Patient Details  ?Name: [name] Potts ?MRN: [id number] ?Date of Birth: [birth year] ? ?Transition of Care (TOC) CM/SW Contact:  ?Angelita Ingles, RN ?Phone [phone] ? ?07/04/2021, 12:01 PM ? ? ?Clinical Narrative:    ?CM at bedside for patient discharging with mini xpress. Patient and wife at bedside both verbalized understanding of tube care and states that they are comfortable with it. No TOC needs noted.  ? ? ?  ?  ? ? ?Patient Goals and CMS Choice ?  ?  ?  ? ?Discharge Placement ?  ?           ?  ?  ?  ?  ? ?Discharge Plan and Services ?  ?  ?           ?  ?  ?  ?  ?  ?  ?  ?  ?  ?  ? ?Social Determinants of Health (SDOH) Interventions ?  ? ? ?Readmission Risk Interventions ?   ? View : No data to display.  ?  ?  ?  ? ? ? ? ? ?

## 2021-07-04 NOTE — Plan of Care (Signed)

## 2021-07-05 ENCOUNTER — Telehealth: Payer: Self-pay

## 2021-07-05 NOTE — Telephone Encounter (Signed)
STD form sent to [facility] per patient to fax number provided. [phone]. ?

## 2021-07-06 ENCOUNTER — Other Ambulatory Visit: Payer: Self-pay | Admitting: Thoracic Surgery (Cardiothoracic Vascular Surgery)

## 2021-07-06 ENCOUNTER — Encounter (HOSPITAL_COMMUNITY): Payer: Self-pay

## 2021-07-06 MED ORDER — ALPRAZOLAM 0.25 MG PO TABS: 0.2500 mg | ORAL_TABLET | Freq: Two times a day (BID) | ORAL | 0 refills | Status: DC | PRN

## 2021-07-06 NOTE — Progress Notes (Signed)
Refilled xanax 0.25 mg PO BID PRN, # 15, 0 refills ? ?Revonda Standard Roxan Hockey, MD ?[facility] ?[phone] ? ?

## 2021-07-09 ENCOUNTER — Other Ambulatory Visit: Payer: Self-pay | Admitting: Thoracic Surgery (Cardiothoracic Vascular Surgery)

## 2021-07-09 DIAGNOSIS — C3490 Malignant neoplasm of unspecified part of unspecified bronchus or lung: Secondary | ICD-10-CM

## 2021-07-10 ENCOUNTER — Encounter (HOSPITAL_COMMUNITY): Payer: Self-pay

## 2021-07-10 ENCOUNTER — Encounter: Payer: Self-pay | Admitting: *Deleted

## 2021-07-10 ENCOUNTER — Ambulatory Visit
Admission: RE | Admit: 2021-07-10 | Discharge: 2021-07-10 | Disposition: A | Discharge status: Home / Self Care | Payer: Commercial Managed Care - PPO | Source: Ambulatory Visit | Attending: Thoracic Surgery (Cardiothoracic Vascular Surgery) | Admitting: Thoracic Surgery (Cardiothoracic Vascular Surgery)

## 2021-07-10 ENCOUNTER — Other Ambulatory Visit: Payer: Self-pay | Admitting: Thoracic Surgery (Cardiothoracic Vascular Surgery)

## 2021-07-10 ENCOUNTER — Other Ambulatory Visit: Payer: Self-pay

## 2021-07-10 ENCOUNTER — Telehealth: Payer: Self-pay

## 2021-07-10 ENCOUNTER — Telehealth: Payer: Self-pay | Admitting: Internal Medicine

## 2021-07-10 ENCOUNTER — Encounter: Payer: Self-pay | Admitting: Thoracic Surgery (Cardiothoracic Vascular Surgery)

## 2021-07-10 ENCOUNTER — Ambulatory Visit: Payer: Commercial Managed Care - PPO

## 2021-07-10 ENCOUNTER — Ambulatory Visit (INDEPENDENT_AMBULATORY_CARE_PROVIDER_SITE_OTHER): Payer: Self-pay | Admitting: Thoracic Surgery (Cardiothoracic Vascular Surgery)

## 2021-07-10 VITALS — BP 112/72 | HR 105 | Resp 20 | Ht 72.0 in | Wt 181.0 lb

## 2021-07-10 DIAGNOSIS — J910 Malignant pleural effusion: Secondary | ICD-10-CM

## 2021-07-10 DIAGNOSIS — C3490 Malignant neoplasm of unspecified part of unspecified bronchus or lung: Secondary | ICD-10-CM

## 2021-07-10 DIAGNOSIS — J90 Pleural effusion, not elsewhere classified: Secondary | ICD-10-CM

## 2021-07-10 MED ORDER — OXYCODONE HCL 5 MG PO TABS: 5.0000 mg | ORAL_TABLET | Freq: Four times a day (QID) | ORAL | 0 refills | Status: DC | PRN

## 2021-07-10 NOTE — Telephone Encounter (Signed)
Scheduled appt per 03/28 referral. Pt is aware of appt date and time. Pt is aware to arrive 15 mins prior to appt time and to bring and updated insurance card. Pt is aware of appt location.   ?

## 2021-07-10 NOTE — Progress Notes (Signed)
Lab orders placed per protocol  ?

## 2021-07-10 NOTE — Progress Notes (Signed)
Oncology Nurse Navigator Documentation ? ? ?  07/10/2021  ? 12:00 PM  ?Oncology Nurse Navigator Flowsheets  ?Navigator Follow Up Date: 07/23/2021  ?Navigator Follow Up Reason: New Patient Appointment  ?Navigator Location [facility]  ?Referral Date to RadOnc/MedOnc 07/10/2021  ?Navigator Encounter Type Other:  ?Treatment Phase Pre-Tx/Tx Discussion  ?Barriers/Navigation Needs Coordination of Care/I received referral on [name] Potts today.  I updated new patient coordinator to call and schedule him to be seen after his pet scan.   ?I also contacted pathology dept per Dr. Julien Nordmann to send recent bx for molecular and PDL 1 testing.    ?Interventions Coordination of Care  ?Acuity Level 3-Moderate Needs (3-4 Barriers Identified)  ?Coordination of Care Pathology;Other  ?Time Spent with Patient 45  ?  ?

## 2021-07-10 NOTE — Progress Notes (Signed)
? ?   ?[address] ?      [place] [address] ?            [phone]   ? ? ?HPI: [name] returns for follow-up after recent VATS for decortication ? ?[name] Potts is a 51 year old man with a history of tobacco use who presented with persistent cough and congestion.  He was found to have a large right pleural effusion.  He had 2 thoracenteses.  Both cytologies were negative.  He had a pneumo ex vacuo after the second thoracentesis.  Bronchoscopy revealed no endobronchial mass lesions. ? ?I did a right VATS on 06/27/2021.  He had extensive tumor involving essentially the entire pleural surface.  The lower lobe was encased in tumor and I decorticated it the best that I could.  He had a residual space and an air leak postoperatively. ? ?Biopsies were positive for adenocarcinoma consistent with a lung primary.  Tumor was sent for molecular testing and that still pending. ? ?We were able to remove one tube while he was in the hospital, but he went home with the other tube with a persistent air leak.   ? ?Since discharge he has been having issues with anxiety over the diagnosis and pain from the surgery.  He has been using oxycodone about 3 times a day and then occasionally using hydrocodone as well.  He has been taking Xanax twice a day for anxiety. ? ?Past Medical History:  ?Diagnosis Date  ? ADHD (attention deficit hyperactivity disorder)   ? Chronic back pain   ? Constipation   ? Dyspnea   ? Pneumonia   ? Seasonal allergies   ? ? ?Current Outpatient Medications  ?Medication Sig Dispense Refill  ? ALPRAZolam (XANAX) 0.25 MG tablet Take 1 tablet (0.25 mg total) by mouth 2 (two) times daily as needed for anxiety. 15 tablet 0  ? atorvastatin (LIPITOR) 40 MG tablet Take 1 tablet (40 mg total) by mouth daily. 90 tablet 1  ? loratadine (CLARITIN) 10 MG tablet Take 10 mg by mouth every evening.    ? oxyCODONE (OXY IR/ROXICODONE) 5 MG immediate release tablet Take 1 tablet (5 mg total) by mouth every 6  (six) hours as needed for moderate pain or severe pain. 40 tablet 0  ? ?No current facility-administered medications for this visit.  ? ? ?Physical Exam ?BP 112/72 (BP Location: Right Arm, Patient Position: Sitting)   Pulse (!) 105   Resp 20   Ht 6' (1.829 m)   Wt 181 lb (82.1 kg)   SpO2 95% Comment: RA  BMI 24.55 kg/m?  ?Well-appearing 51 year old man in no acute distress ?Alert and oriented x3 with no focal deficits ?Lungs diminished at right base but otherwise clear ?Incision intact with small seroma, mild erythema at chest tube site ? ?Diagnostic Tests: ?CHEST - 2 VIEW ?  ?COMPARISON:  07/04/2021 ?  ?FINDINGS: ?Right chest tube in place. Reduced but not completely resolved ?subcutaneous edema along the right hemithorax. ?  ?Slightly improved aeration laterally in the right mid lung and right ?lung base although some right mid lung and right basilar airspace ?opacities persist. There continues to be right pleural fluid along ?with some horizontal potential air-fluid levels along loculations at ?the right lung base and potentially anterior right mid lung raising ?the possibility of persistent mildly loculated hydropneumothorax. ?  ?Mild levoconvex thoracic scoliosis.  The left lung remains clear. ?  ?IMPRESSION: ?1. Minimally improved aeration laterally in the right chest. ?Persistent  right pleural fluid and gas densities primarily at the ?right lung base with some potential air fluid levels suggesting ?loculated small hydropneumothorax component. There continued ?airspace opacities in the right mid lung and right lung base. ?2. Reduced subcutaneous emphysema. ?  ?  ?Electronically Signed ?  By: [name] Potts M.D. ?  On: 07/10/2021 11:24 ?I personally reviewed the chest x-ray.  Shows expected postoperative changes in the setting. ? ?Impression: ?[name] Potts is a 51 year old male with history of tobacco abuse who presented with shortness of breath.  He was found to have a large right pleural effusion.   He had 2 thoracenteses, but had a trapped right lung.  He underwent a VATS for decortication.  He had extensive tumor involving the visceral and parietal pleura and encasing the lower lobe.  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 ? ?His right lower lobe was trapped with tumor and and inflammatory peel.  That was decorticated.  He initially had a space but that is gotten dramatically smaller over time. ? ?On exam today he does not have an air leak.  He is draining about 150 mL a day from the tube.  At this point I think the tube can safely be removed. ? ?He cannot have an MRI because of BB in his neck.  He had a CT of the head while in the hospital which showed no evidence of metastatic disease.  He needs a PET/CT to complete his clinical staging.  He is scheduled to have that next week. ? ?He needs to see oncology.  He would like to do that at the cancer center here.  We will arrange for an appointment with [name] Potts. ? ?The tissue has been sent for molecular testing. ? ?Procedure: ?Right chest tube removed intact without difficulty.  Tolerated well. ? ?Plan: ?PET as scheduled on 07/20/2021 ?Stop hydrocodone ?Refill oxycodone 5 mg every 6 hours as needed, 40 tablets, no refills ?May use acetaminophen in addition to oxycodone ?Xanax as needed for anxiety ?Referral to [name] Potts for stage IV adenocarcinoma ?PA and lateral chest x-ray today status post chest tube removal ?Return in 1 week with PA and lateral chest x-ray ? ? ?[name] Nakayama, MD ?[facility] ?[phone] ? ? ? ?

## 2021-07-10 NOTE — Telephone Encounter (Signed)
-----   Message from Melrose Nakayama, MD sent at 07/06/2021  4:15 PM EDT ----- ?Regarding: RE: refill on medication ?Ok will do this one time ? ?[facility] ?----- Message ----- ?From: Marylen Ponto, LPN ?Sent: 07/06/2021   3:29 PM EDT ?To: Melrose Nakayama, MD ?Subject: refill on medication                          ? ?Mr Errico is calling for a refill on the Xanax Rx you gave him on the 06/25/21. If you agree please send ?To [facility] noted in his chart, Thanks SW ? ? ?

## 2021-07-11 ENCOUNTER — Encounter: Payer: Self-pay | Admitting: *Deleted

## 2021-07-11 NOTE — Progress Notes (Signed)
Oncology Nurse Navigator Documentation ? ? ?  07/11/2021  ?  9:00 AM 07/10/2021  ? 12:00 PM  ?Oncology Nurse Navigator Flowsheets  ?Abnormal Finding Date 05/29/2021   ?Confirmed Diagnosis Date 06/27/2021   ?Diagnosis Status Confirmed Diagnosis Complete   ?Navigator Follow Up Date:  07/23/2021  ?Navigator Follow Up Reason:  New Patient Appointment  ?Navigator Location [facility]  ?Referral Date to RadOnc/MedOnc  07/10/2021  ?Navigator Encounter Type Other: Other:  ?Treatment Phase  Pre-Tx/Tx Discussion  ?Barriers/Navigation Needs Coordination of Care/I notified referring office of [name] Potts appt with Dr. Julien Nordmann.  Coordination of Care  ?Interventions Coordination of Care Coordination of Care  ?Acuity Level 2-Minimal Needs (1-2 Barriers Identified) Level 3-Moderate Needs (3-4 Barriers Identified)  ?Coordination of Care Other Pathology;Other  ?Time Spent with Patient 15 45  ?  ?

## 2021-07-13 ENCOUNTER — Encounter: Payer: Self-pay | Admitting: *Deleted

## 2021-07-13 NOTE — Progress Notes (Signed)
Oncology Nurse Navigator Documentation ? ? ?  07/13/2021  ?  3:00 PM 07/11/2021  ?  9:00 AM 07/10/2021  ? 12:00 PM  ?Oncology Nurse Navigator Flowsheets  ?Abnormal Finding Date  05/29/2021   ?Confirmed Diagnosis Date  06/27/2021   ?Diagnosis Status  Confirmed Diagnosis Complete   ?Navigator Follow Up Date:   07/23/2021  ?Navigator Follow Up Reason:   New Patient Appointment  ?Navigator Location [facility]  ?Referral Date to RadOnc/MedOnc   07/10/2021  ?Navigator Encounter Type Pathology Review;Molecular Studies Other: Other:  ?Treatment Phase   Pre-Tx/Tx Discussion  ?Barriers/Navigation Needs Coordination of Care/I followed up on molecular test.  PDL 1 and molecular results are completed and on the patient portal for [facility]. I will update Dr. Julien Nordmann.  Coordination of Care Coordination of Care  ?Interventions Coordination of Care Coordination of Care Coordination of Care  ?Acuity Level 2-Minimal Needs (1-2 Barriers Identified) Level 2-Minimal Needs (1-2 Barriers Identified) Level 3-Moderate Needs (3-4 Barriers Identified)  ?Coordination of Care Pathology Other Pathology;Other  ?Time Spent with Patient 30 15 45  ?  ?

## 2021-07-16 ENCOUNTER — Other Ambulatory Visit: Payer: Self-pay | Admitting: Thoracic Surgery (Cardiothoracic Vascular Surgery)

## 2021-07-16 DIAGNOSIS — C3490 Malignant neoplasm of unspecified part of unspecified bronchus or lung: Secondary | ICD-10-CM

## 2021-07-17 ENCOUNTER — Ambulatory Visit (HOSPITAL_COMMUNITY)
Admission: RE | Admit: 2021-07-17 | Discharge: 2021-07-17 | Disposition: A | Discharge status: Home / Self Care | Payer: Commercial Managed Care - PPO | Source: Ambulatory Visit | Attending: Thoracic Surgery (Cardiothoracic Vascular Surgery) | Admitting: Thoracic Surgery (Cardiothoracic Vascular Surgery)

## 2021-07-17 ENCOUNTER — Ambulatory Visit (INDEPENDENT_AMBULATORY_CARE_PROVIDER_SITE_OTHER): Payer: Self-pay | Admitting: Thoracic Surgery (Cardiothoracic Vascular Surgery)

## 2021-07-17 ENCOUNTER — Ambulatory Visit
Admission: RE | Admit: 2021-07-17 | Discharge: 2021-07-17 | Disposition: A | Discharge status: Home / Self Care | Payer: Commercial Managed Care - PPO | Source: Ambulatory Visit | Attending: Thoracic Surgery (Cardiothoracic Vascular Surgery) | Admitting: Thoracic Surgery (Cardiothoracic Vascular Surgery)

## 2021-07-17 ENCOUNTER — Other Ambulatory Visit: Payer: Self-pay | Admitting: Thoracic Surgery (Cardiothoracic Vascular Surgery)

## 2021-07-17 VITALS — BP 117/77 | HR 110 | Resp 20 | Ht 72.0 in | Wt 177.0 lb

## 2021-07-17 DIAGNOSIS — J910 Malignant pleural effusion: Secondary | ICD-10-CM | POA: Insufficient documentation

## 2021-07-17 DIAGNOSIS — C3490 Malignant neoplasm of unspecified part of unspecified bronchus or lung: Secondary | ICD-10-CM

## 2021-07-17 DIAGNOSIS — J9819 Other pulmonary collapse: Secondary | ICD-10-CM

## 2021-07-17 MED ORDER — APIXABAN 5 MG PO TABS: ORAL_TABLET | ORAL | 3 refills | Status: DC

## 2021-07-17 MED ORDER — OXYCODONE HCL 5 MG PO TABS: 5.0000 mg | ORAL_TABLET | Freq: Four times a day (QID) | ORAL | 0 refills | Status: DC | PRN

## 2021-07-17 NOTE — Progress Notes (Signed)
Lower extremity venous has been completed.  ? ?Preliminary results in CV Proc.  ? ?[name] Potts [place] ?07/17/2021 2:00 PM    ?

## 2021-07-17 NOTE — Addendum Note (Signed)
Addended by: Melrose Nakayama on: 07/17/2021 02:10 PM ? ? Modules accepted: Orders ? ?

## 2021-07-17 NOTE — Progress Notes (Addendum)
? ?   ?[address] ?      [place] [address] ?            [phone]   ? ? ?HPI: Mr. Chaloux returns for follow-up after recent VATS decortication. ? ?Kimmie Doren is a 51 year old man with history of tobacco abuse who presented with persistent cough and congestion.  He was found to have a large right pleural effusion.  He had 2 thoracenteses and both cytologies were negative.  He had a pneumo ex vacuo after the second thoracentesis. ? ?I did a right VATS on 06/27/2021.  He had extensive metastatic tumor.  Biopsies were positive for adenocarcinoma.  His lower lobe was entrapped and we decorticated that.  He had an air leak for a while and actually went home with tubes.  We were able to get the last tube out in the office last week. ? ?He continues to have some incisional pain.  He also has some right shoulder and shoulder blade pain.  He is taking oxycodone about 4 times a day.  He has about 2-1/2 days of pills left currently. ? ?He says that he has been having "leg cramps" in his right calf over the past couple of days.  He took some of his wife's potassium, but that did not really help.   ? ?Past Medical History:  ?Diagnosis Date  ? ADHD (attention deficit hyperactivity disorder)   ? Chronic back pain   ? Constipation   ? Dyspnea   ? Pneumonia   ? Seasonal allergies   ? ? ?Current Outpatient Medications  ?Medication Sig Dispense Refill  ? ALPRAZolam (XANAX) 0.25 MG tablet Take 1 tablet (0.25 mg total) by mouth 2 (two) times daily as needed for anxiety. 15 tablet 0  ? atorvastatin (LIPITOR) 40 MG tablet Take 1 tablet (40 mg total) by mouth daily. 90 tablet 1  ? loratadine (CLARITIN) 10 MG tablet Take 10 mg by mouth every evening.    ? oxyCODONE (OXY IR/ROXICODONE) 5 MG immediate release tablet Take 1 tablet (5 mg total) by mouth every 6 (six) hours as needed for moderate pain or severe pain. 40 tablet 0  ? ?No current facility-administered medications for this visit.  ? ? ?Physical Exam ?BP 117/77    Pulse (!) 110   Resp 20   Ht 6' (1.829 m)   Wt 177 lb (80.3 kg)   SpO2 96%   BMI 24.6 kg/m?  ?51 year old man in no acute distress ?Alert and oriented x3 with no focal deficits ?Diminished breath sounds at right base but otherwise clear ?Incisions healing well with minimal exudate at chest tube site ?Mild swelling and tenderness right calf ? ?Diagnostic Tests: ?Chest x-ray shows postoperative changes with improved aeration of the left base. ? ?Impression: ?Lamarr Feenstra is a 51 year old man with history of tobacco abuse who presented with cough and shortness of breath.  He was found to have a large right pleural effusion.  He ultimately underwent a right VATS to drain the effusion decorticate the lower lobe.  He was found to have stage IV adenocarcinoma of the lung. ? ?He initially went home with a chest tube due to to a persistent air leak.  That resolved and we were able to remove the tube last week.  There is been no recurrence of effusion in comparison to his last film, in fact his lower lobe aeration is improved. ? ?Postsurgical pain-he is on oxycodone 5 mg every 6 hours as needed for  that.  He has about 10 tablets left.  He is going to need another refill so went ahead and put that prescription in for him today.  He can pick it up when he runs out of his current supply. ? ?He received a call for follow-up from Dr. Marlana Salvage office.  I recommended he follow-up with him. ? ?He has new right calf pain and on exam he has some mild tenderness and swelling.  Given his new diagnosis of metastatic cancer he is at high risk for DVT.  I am going to do a duplex to rule out that possibility. ? ?He is scheduled to have a PET/CT on Friday. ? ?Plan: ?Follow-up as scheduled with Dr. Verdie Mosher ?PET/CT on Friday ?Has an appointment with Dr. Julien Nordmann on Monday. ?Return in 6 weeks with PA and lateral chest x-ray ? ?Melrose Nakayama, MD ?[facility] ?([phone] ? ?Our office received a call  from the hospital that the duplex was positive for DVT.  Will prescribe Eliquis. ? ?Revonda Standard. Roxan Hockey, MD ?[facility] ?[phone] ? ? ? ?

## 2021-07-20 ENCOUNTER — Ambulatory Visit (HOSPITAL_COMMUNITY)
Admission: RE | Admit: 2021-07-20 | Discharge: 2021-07-20 | Disposition: A | Discharge status: Home / Self Care | Payer: Commercial Managed Care - PPO | Source: Ambulatory Visit | Attending: Thoracic Surgery (Cardiothoracic Vascular Surgery) | Admitting: Thoracic Surgery (Cardiothoracic Vascular Surgery)

## 2021-07-20 ENCOUNTER — Other Ambulatory Visit: Payer: Self-pay | Admitting: Thoracic Surgery (Cardiothoracic Vascular Surgery)

## 2021-07-20 DIAGNOSIS — J90 Pleural effusion, not elsewhere classified: Secondary | ICD-10-CM | POA: Diagnosis not present

## 2021-07-20 LAB — GLUCOSE, CAPILLARY: Glucose-Capillary: 95 mg/dL (ref 70–99)

## 2021-07-20 MED ORDER — FLUDEOXYGLUCOSE F - 18 (FDG) INJECTION
8.5000 | Freq: Once | INTRAVENOUS | Status: AC
  Administered 2021-07-20: 8.52 via INTRAVENOUS

## 2021-07-23 ENCOUNTER — Inpatient Hospital Stay: Payer: Commercial Managed Care - PPO

## 2021-07-23 ENCOUNTER — Inpatient Hospital Stay: Payer: Commercial Managed Care - PPO | Attending: Internal Medicine | Admitting: Internal Medicine

## 2021-07-23 ENCOUNTER — Encounter: Payer: Self-pay | Admitting: Internal Medicine

## 2021-07-23 ENCOUNTER — Other Ambulatory Visit: Payer: Self-pay

## 2021-07-23 VITALS — BP 105/74 | HR 104 | Temp 98.5°F | Resp 18 | Ht 72.0 in | Wt 179.9 lb

## 2021-07-23 DIAGNOSIS — Z87891 Personal history of nicotine dependence: Secondary | ICD-10-CM | POA: Diagnosis not present

## 2021-07-23 DIAGNOSIS — J910 Malignant pleural effusion: Secondary | ICD-10-CM | POA: Insufficient documentation

## 2021-07-23 DIAGNOSIS — Z800 Family history of malignant neoplasm of digestive organs: Secondary | ICD-10-CM | POA: Diagnosis not present

## 2021-07-23 DIAGNOSIS — G893 Neoplasm related pain (acute) (chronic): Secondary | ICD-10-CM | POA: Insufficient documentation

## 2021-07-23 DIAGNOSIS — K5900 Constipation, unspecified: Secondary | ICD-10-CM | POA: Insufficient documentation

## 2021-07-23 DIAGNOSIS — C3432 Malignant neoplasm of lower lobe, left bronchus or lung: Secondary | ICD-10-CM | POA: Insufficient documentation

## 2021-07-23 DIAGNOSIS — J439 Emphysema, unspecified: Secondary | ICD-10-CM | POA: Diagnosis not present

## 2021-07-23 DIAGNOSIS — C778 Secondary and unspecified malignant neoplasm of lymph nodes of multiple regions: Secondary | ICD-10-CM | POA: Insufficient documentation

## 2021-07-23 DIAGNOSIS — Z5112 Encounter for antineoplastic immunotherapy: Secondary | ICD-10-CM

## 2021-07-23 DIAGNOSIS — C3431 Malignant neoplasm of lower lobe, right bronchus or lung: Secondary | ICD-10-CM

## 2021-07-23 DIAGNOSIS — C3490 Malignant neoplasm of unspecified part of unspecified bronchus or lung: Secondary | ICD-10-CM

## 2021-07-23 DIAGNOSIS — Z5111 Encounter for antineoplastic chemotherapy: Secondary | ICD-10-CM | POA: Diagnosis not present

## 2021-07-23 DIAGNOSIS — Z7901 Long term (current) use of anticoagulants: Secondary | ICD-10-CM | POA: Insufficient documentation

## 2021-07-23 DIAGNOSIS — Z7189 Other specified counseling: Secondary | ICD-10-CM | POA: Insufficient documentation

## 2021-07-23 DIAGNOSIS — C3491 Malignant neoplasm of unspecified part of right bronchus or lung: Secondary | ICD-10-CM | POA: Diagnosis not present

## 2021-07-23 LAB — CBC WITH DIFFERENTIAL (CANCER CENTER ONLY)
Abs Immature Granulocytes: 0.06 10*3/uL (ref 0.00–0.07)
Basophils Absolute: 0.1 10*3/uL (ref 0.0–0.1)
Basophils Relative: 1 %
Eosinophils Absolute: 0.2 10*3/uL (ref 0.0–0.5)
Eosinophils Relative: 2 %
HCT: 29.2 % — ABNORMAL LOW (ref 39.0–52.0)
Hemoglobin: 9.1 g/dL — ABNORMAL LOW (ref 13.0–17.0)
Immature Granulocytes: 1 %
Lymphocytes Relative: 13 %
Lymphs Abs: 1.3 10*3/uL (ref 0.7–4.0)
MCH: 25.9 pg — ABNORMAL LOW (ref 26.0–34.0)
MCHC: 31.2 g/dL (ref 30.0–36.0)
MCV: 83 fL (ref 80.0–100.0)
Monocytes Absolute: 0.8 10*3/uL (ref 0.1–1.0)
Monocytes Relative: 8 %
Neutro Abs: 7.6 10*3/uL (ref 1.7–7.7)
Neutrophils Relative %: 75 %
Platelet Count: 398 10*3/uL (ref 150–400)
RBC: 3.52 MIL/uL — ABNORMAL LOW (ref 4.22–5.81)
RDW: 14.1 % (ref 11.5–15.5)
WBC Count: 10 10*3/uL (ref 4.0–10.5)
nRBC: 0 % (ref 0.0–0.2)

## 2021-07-23 LAB — CMP (CANCER CENTER ONLY)
ALT: 28 U/L (ref 0–44)
AST: 16 U/L (ref 15–41)
Albumin: 3.2 g/dL — ABNORMAL LOW (ref 3.5–5.0)
Alkaline Phosphatase: 169 U/L — ABNORMAL HIGH (ref 38–126)
Anion gap: 8 (ref 5–15)
BUN: 9 mg/dL (ref 6–20)
CO2: 27 mmol/L (ref 22–32)
Calcium: 8.8 mg/dL — ABNORMAL LOW (ref 8.9–10.3)
Chloride: 102 mmol/L (ref 98–111)
Creatinine: 0.84 mg/dL (ref 0.61–1.24)
GFR, Estimated: >60 mL/min (ref >60)
Glucose, Bld: 124 mg/dL — ABNORMAL HIGH (ref 70–99)
Potassium: 3.7 mmol/L (ref 3.5–5.1)
Sodium: 137 mmol/L (ref 135–145)
Total Bilirubin: 0.4 mg/dL (ref 0.3–1.2)
Total Protein: 7 g/dL (ref 6.5–8.1)

## 2021-07-23 MED ORDER — INTEGRA PLUS PO CAPS: 1.0000 | ORAL_CAPSULE | Freq: Every day | ORAL | 3 refills | Status: DC

## 2021-07-23 MED ORDER — LIDOCAINE-PRILOCAINE 2.5-2.5 % EX CREA: TOPICAL_CREAM | CUTANEOUS | 0 refills | Status: DC

## 2021-07-23 MED ORDER — MORPHINE SULFATE ER 15 MG PO TBCR: 30.0000 mg | EXTENDED_RELEASE_TABLET | Freq: Two times a day (BID) | ORAL | 0 refills | Status: DC

## 2021-07-23 MED ORDER — PROCHLORPERAZINE MALEATE 10 MG PO TABS
10.0000 mg | ORAL_TABLET | Freq: Four times a day (QID) | ORAL | 0 refills | Status: DC | PRN
Start: 2021-07-23 — End: 2021-08-20

## 2021-07-23 MED ORDER — PROCHLORPERAZINE MALEATE 10 MG PO TABS
10.0000 mg | ORAL_TABLET | Freq: Four times a day (QID) | ORAL | 0 refills | Status: DC | PRN
Start: 2021-07-23 — End: 2021-07-23

## 2021-07-23 MED ORDER — FOLIC ACID 1 MG PO TABS: 1.0000 mg | ORAL_TABLET | Freq: Every day | ORAL | 4 refills | Status: AC

## 2021-07-23 MED ORDER — MIRTAZAPINE 30 MG PO TABS
30.0000 mg | ORAL_TABLET | Freq: Every day | ORAL | 2 refills | Status: DC
Start: 2021-07-23 — End: 2021-10-23

## 2021-07-23 MED ORDER — FOLIC ACID 1 MG PO TABS: 1.0000 mg | ORAL_TABLET | Freq: Every day | ORAL | 4 refills | Status: DC

## 2021-07-23 MED ORDER — LIDOCAINE-PRILOCAINE 2.5-2.5 % EX CREA: TOPICAL_CREAM | CUTANEOUS | 0 refills | Status: AC

## 2021-07-23 MED ORDER — MIRTAZAPINE 30 MG PO TABS: 30.0000 mg | ORAL_TABLET | Freq: Every day | ORAL | 2 refills | Status: DC

## 2021-07-23 MED ORDER — CYANOCOBALAMIN 1000 MCG/ML IJ SOLN
1000.0000 ug | Freq: Once | INTRAMUSCULAR | Status: AC
  Administered 2021-07-23: 1000 ug via INTRAMUSCULAR
  Filled 2021-07-23: qty 1

## 2021-07-23 NOTE — Patient Instructions (Signed)
Vitamin B12 Deficiency Vitamin B12 deficiency means that your body does not have enough vitamin B12. The body needs this important vitamin: To make red blood cells. To make genes (DNA). To help the nerves work. If you do not have enough vitamin B12 in your body, you can have health problems, such as not having enough red blood cells in the blood (anemia). What are the causes? Not eating enough foods that contain vitamin B12. Not being able to take in (absorb) vitamin B12 from the food that you eat. Certain diseases. A condition in which the body does not make enough of a certain protein. This results in your body not taking in enough vitamin B12. Having a surgery in which part of the stomach or small intestine is taken out. Taking medicines that make it hard for the body to take in vitamin B12. These include: Heartburn medicines. Some medicines that are used to treat diabetes. What increases the risk? Being an older adult. Eating a vegetarian or vegan diet that does not include any foods that come from animals. Not eating enough foods that contain vitamin B12 while you are pregnant. Taking certain medicines. Having alcoholism. What are the signs or symptoms? In some cases, there are no symptoms. If the condition leads to too few blood cells or nerve damage, symptoms can occur, such as: Feeling weak or tired. Not being hungry. Losing feeling (numbness) or tingling in your hands and feet. Redness and burning of the tongue. Feeling sad (depressed). Confusion or memory problems. Trouble walking. If anemia is very bad, symptoms can include: Being short of breath. Being dizzy. Having a very fast heartbeat. How is this treated? Changing the way you eat and drink, such as: Eating more foods that contain vitamin B12. Drinking little or no alcohol. Getting vitamin B12 shots. Taking vitamin B12 supplements by mouth (orally). Your doctor will tell you the dose that is best for you. Follow  these instructions at home: Eating and drinking  Eat foods that come from animals and have a lot of vitamin B12 in them. These include: Meats and poultry. This includes beef, pork, chicken, turkey, and organ meats, such as liver. Seafood, such as clams, rainbow trout, salmon, tuna, and haddock. Eggs. Dairy foods such as milk, yogurt, and cheese. Eat breakfast cereals that have vitamin B12 added to them (are fortified). Check the label. The items listed above may not be a complete list of foods and beverages you can eat and drink. Contact a dietitian for more information. Alcohol use Do not drink alcohol if: Your doctor tells you not to drink. You are pregnant, may be pregnant, or are planning to become pregnant. If you drink alcohol: Limit how much you have to: 0-1 drink a day for women. 0-2 drinks a day for men. Know how much alcohol is in your drink. In the [place] one drink equals one 12 oz bottle of beer (355 mL), one 5 oz glass of wine (148 mL), or one 1 oz glass of hard liquor (44 mL). General instructions Get any vitamin B12 shots if told by your doctor. Take supplements only as told by your doctor. Follow the directions. Keep all follow-up visits. Contact a doctor if: Your symptoms come back. Your symptoms get worse or do not get better with treatment. Get help right away if: You have trouble breathing. You have a very fast heartbeat. You have chest pain. You get dizzy. You faint. These symptoms may be an emergency. Get help right away. Call 911.   Do not wait to see if the symptoms will go away. Do not drive yourself to the hospital. Summary Vitamin B12 deficiency means that your body is not getting enough of the vitamin. In some cases, there are no symptoms of this condition. Treatment may include making a change in the way you eat and drink, getting shots, or taking supplements. Eat foods that have vitamin B12 in them. This information is not intended to replace advice  given to you by your health care provider. Make sure you discuss any questions you have with your health care provider. Document Revised: 11/24/2020 Document Reviewed: 11/24/2020 Elsevier Patient Education  2022 [employer]  

## 2021-07-23 NOTE — Progress Notes (Signed)
? ? [place] ?[phone]   [phone] ? ?CONSULT NOTE ? ?REFERRING PHYSICIAN: Dr. Modesto Charon ? ?REASON FOR CONSULTATION:  ?51 years old white male recently diagnosed with lung cancer ? ?HPI ?[name] Potts is a 51 y.o. male with past medical history significant for ADHD, chronic back pain, seasonal allergy as well as deep venous thrombosis of the right leg diagnosed a week ago and cervical spine surgery and December 2022.  The patient mentioned that after his cervical spine surgery in December 2002 he and his wife had flulike symptoms.  She recovered quickly but he did not improve much and he continues to have cough with chest congestion and worsening dyspnea.  On June 01, 2021 he was seen at one of the urgent care center at [facility] and requested chest x-ray to be performed that showed large right pleural effusion.  The patient underwent ultrasound-guided right thoracentesis on 06/01/2021 and the cytology was negative for malignancy.  He was referred to Dr. Verdie Mosher at [facility] and had a bronchoscopy on 06/12/2021 and again the final cytology was negative for malignancy.  The patient was then referred to Dr. Roxan Hockey for evaluation of his recurrent right pleural effusion.  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  The final pathology from the pleural peel and the parietal pleura ([id number]) showed adenocarcinoma of lung primary. ?The malignant cells (Blocks B1, C1) were interrogated with immunohistochemical (IHC) stains.  The TTF-1 and mCEA are positive and all mesothelial markers are negative (calretinin, D2-40, CK5/6, WT-1). These results are characteristic for a primary adenocarcinoma of the lung. ?The tissue block was sent for molecular studies by foundation 1 and it showed no actionable mutations  and PD-L1 expression of 1%.  It showed KRAS G12D, Myc amplification, CCND3 amplification, TP53 R248L, VEGFA amplification. ?The patient also had CT of the head with and without contrast on July 01, 2021 and that showed no evidence of metastatic disease to the brain. ?The patient also had a PET scan on 07/20/2021 and it showed loculated right hydropneumothorax with hypermetabolic pleural thickening on the right also much of it could be inflammatory, the patient has known right pleural tumor and accordingly a component is likely malignant.  There was hypermetabolic right lower lobe nodule surrounded by atelectasis with maximum SUV of 9.7 compatible with malignancy.  There was also substantial hypermetabolic right paratracheal and right subcarinal adenopathy along with low-grade accentuated metabolic activity in the right axillary lymph nodes no current findings of metastatic disease to the abdomen/pelvis, skeleton or neck. ?Dr. Roxan Hockey kindly referred the patient to me today for evaluation and recommendation regarding treatment of his condition. ?When seen today he continues to complain of increasing fatigue and weakness as well as recent diagnosis of deep venous thrombosis of the right lower extremity and currently on Eliquis.  He continues to have pain on the right side of the chest and right shoulder.  He is currently on oxycodone 5 mg every 6 hours but does not keep his pain under control except for few hours.  He also has shortness of breath at baseline increased with exertion with no hemoptysis.  He has no nausea, vomiting, diarrhea but has constipation and currently taking Senokot.  The patient has no headache or visual changes. ?Family history significant for mother with colon cancer and died at age 1.  Father  died from stroke at age 44. ?The patient is married and has 1 daughter.  He was accompanied today by his wife [name] Potts and his daughter [name] Potts.  He works as an Electrical engineer.  He has a history of  smoking less than 1 pack/day for around 31 years and quit in December 2022.  He drinks alcohol occasionally and no history of drug abuse. ? ?HPI ? ?Past Medical History:  ?Diagnosis Date  ? ADHD (attention deficit hyperactivity disorder)   ? Chronic back pain   ? Constipation   ? Dyspnea   ? Pneumonia   ? Seasonal allergies   ? ? ?Past Surgical History:  ?Procedure Laterality Date  ? COLONOSCOPY  10/11/2011  ? Procedure: COLONOSCOPY;  Surgeon: Daneil Dolin, MD;  [facility];  Service: Endoscopy;  Laterality: N/A;  1:45 PM  ? COLONOSCOPY N/A 01/23/2018  ? Procedure: COLONOSCOPY;  Surgeon: Daneil Dolin, MD;  [facility];  Service: Endoscopy;  Laterality: N/A;  1:00  ? NECK SURGERY    ? Apr 05, 2021- per pt report cervical surgery at day surgery center  ? POLYPECTOMY  01/23/2018  ? Procedure: POLYPECTOMY;  Surgeon: Daneil Dolin, MD;  [facility];  Service: Endoscopy;;  ? VIDEO ASSISTED THORACOSCOPY (VATS)/DECORTICATION Right 06/27/2021  ? Procedure: VIDEO ASSISTED THORACOSCOPY (VATS), Drainage of RIGHT pleural effusion/ DECORTICATION;  Surgeon: Melrose Nakayama, MD;  Location: [place];  Service: Thoracic;  Laterality: Right;  ? ? ?Family History  ?Problem Relation Age of Onset  ? Colon cancer Mother 12  ?     passed at age 72  ? Stroke Father   ? ? ?Social History ?Social History  ? ?Tobacco Use  ? Smoking status: Former  ?  Packs/day: 0.50  ?  Types: Cigarettes  ?  Quit date: 03/15/2021  ?  Years since quitting: 0.3  ? Smokeless tobacco: Current  ?  Types: Snuff  ? Tobacco comments:  ?  Using nicotine pouches  ?Vaping Use  ? Vaping Use: Former  ?Substance Use Topics  ? Alcohol use: Not Currently  ?  Comment: has not used since 20s  ? Drug use: Not Currently  ?  Types: Marijuana  ?  Comment: has not used since college  ? ? ?No Known Allergies ? ?Current Outpatient Medications  ?Medication Sig Dispense Refill  ? ALPRAZolam (XANAX) 0.25 MG tablet Take 1 tablet (0.25 mg total)  by mouth 2 (two) times daily as needed for anxiety. 15 tablet 0  ? apixaban (ELIQUIS) 5 MG TABS tablet Take 2 tablets (24m) twice daily for 7 days, then 1 tablet (571m twice daily 60 tablet 3  ? atorvastatin (LIPITOR) 40 MG tablet Take 1 tablet (40 mg total) by mouth daily. 90 tablet 1  ? loratadine (CLARITIN) 10 MG tablet Take 10 mg by mouth every evening.    ? oxyCODONE (OXY IR/ROXICODONE) 5 MG immediate release tablet Take 1 tablet (5 mg total) by mouth every 6 (six) hours as needed for moderate pain or severe pain. 40 tablet 0  ? ?No current facility-administered medications for this visit.  ? ? ?Review of Systems ? ?Constitutional: positive for anorexia, fatigue, and weight loss ?Eyes: negative ?Ears, nose, mouth, throat, and face: negative ?Respiratory: positive for dyspnea on exertion and pleurisy/chest pain ?Cardiovascular: negative ?Gastrointestinal: positive for constipation ?Genitourinary:negative ?Integument/breast: negative ?Hematologic/lymphatic: negative ?Musculoskeletal:positive for muscle weakness ?Neurological: negative ?Behavioral/Psych: positive for anxiety and depression ?Endocrine: negative ?Allergic/Immunologic: negative ? ?Physical Exam ? ?  QXL:LIYIY, healthy, no distress, well nourished, well developed, and crying ?SKIN: skin color, texture, turgor are normal, no rashes or significant lesions ?HEAD: Normocephalic, No masses, lesions, tenderness or abnormalities ?EYES: normal, PERRLA, Conjunctiva are pink and non-injected ?EARS: External ears normal, Canals clear ?OROPHARYNX:no exudate, no erythema, and lips, buccal mucosa, and tongue normal  ?NECK: supple, no adenopathy, no JVD ?LYMPH:  no palpable lymphadenopathy, no hepatosplenomegaly ?LUNGS: coarse sounds heard, decreased breath sounds ?HEART: regular rate & rhythm, no murmurs, and no gallops ?ABDOMEN:abdomen soft, non-tender, normal bowel sounds, and no masses or organomegaly ?BACK: Back symmetric, no curvature., No CVA  tenderness ?EXTREMITIES:no joint deformities, effusion, or inflammation, no edema  ?NEURO: alert & oriented x 3 with fluent speech, no focal motor/sensory deficits ? ?PERFORMANCE STATUS: ECOG 1 ? ?LABORATORY DATA: ?Lab

## 2021-07-23 NOTE — Progress Notes (Signed)
START ON PATHWAY REGIMEN - Non-Small Cell Lung     A cycle is every 21 days:     Pembrolizumab      Pemetrexed      Carboplatin   **Always confirm dose/schedule in your pharmacy ordering system**  Patient Characteristics: Stage IV Metastatic, Nonsquamous, Molecular Analysis Completed, Molecular Alteration Present and Targeted Therapy Exhausted OR EGFR Exon 20+ or KRAS G12C+ or HER2+ Present and No Prior Chemo/Immunotherapy OR No Alteration Present, Initial  Chemotherapy/Immunotherapy, PS = 0, 1, No Alteration Present, No Alteration Present, Candidate for Immunotherapy, PD-L1 Expression Positive 1-49% (TPS) / Negative / Not Tested / Awaiting Test Results and Immunotherapy Candidate Therapeutic Status: Stage IV Metastatic Histology: Nonsquamous Cell Broad Molecular Profiling Status: Molecular Analysis Completed Molecular Analysis Results: No Alteration Present ECOG Performance Status: 1 Chemotherapy/Immunotherapy Line of Therapy: Initial Chemotherapy/Immunotherapy EGFR Exons 18-21 Mutation Testing Status: Completed and Negative ALK Fusion/Rearrangement Testing Status: Completed and Negative BRAF V600 Mutation Testing Status: Completed and Negative KRAS G12C Mutation Testing Status: Completed and Negative MET Exon 14 Mutation Testing Status: Completed and Negative RET Fusion/Rearrangement Testing Status: Completed and Negative HER2 Mutation Testing Status: Completed and Negative NTRK Fusion/Rearrangement Testing Status: Completed and Negative ROS1 Fusion/Rearrangement Testing Status: Completed and Negative Immunotherapy Candidate Status: Candidate for Immunotherapy PD-L1 Expression Status: PD-L1 Positive 1-49% (TPS) Intent of Therapy: Non-Curative / Palliative Intent, Discussed with Patient 

## 2021-07-23 NOTE — Progress Notes (Signed)
Information provided on [facility]. ?

## 2021-07-24 ENCOUNTER — Encounter: Payer: Self-pay | Admitting: *Deleted

## 2021-07-24 ENCOUNTER — Telehealth: Payer: Self-pay | Admitting: Internal Medicine

## 2021-07-24 NOTE — Progress Notes (Signed)
I followed up on patient's medication and they have been order to go at [facility] to the [facility] in [place] as patient requested.  ?

## 2021-07-24 NOTE — Progress Notes (Signed)
Oncology Nurse Navigator Documentation ? ? ?  07/24/2021  ?  8:00 AM 07/13/2021  ?  3:00 PM 07/11/2021  ?  9:00 AM 07/10/2021  ? 12:00 PM  ?Oncology Nurse Navigator Flowsheets  ?Abnormal Finding Date   05/29/2021   ?Confirmed Diagnosis Date   06/27/2021   ?Diagnosis Status   Confirmed Diagnosis Complete   ?Planned Course of Treatment Chemotherapy;Targeted Therapy     ?Phase of Treatment Targeted Therapy     ?Navigator Follow Up Date: 07/26/2021   07/23/2021  ?Navigator Follow Up Reason: Appointment Review   New Patient Appointment  ?Navigator Location [facility]  ?Referral Date to RadOnc/MedOnc    07/10/2021  ?Navigator Encounter Type Appt/Treatment Plan Review Pathology Review;Molecular Studies Other: Other:  ?Patient Visit Type Other     ?Treatment Phase Pre-Tx/Tx Discussion   Pre-Tx/Tx Discussion  ?Barriers/Navigation Needs Coordination of Care Coordination of Care Coordination of Care Coordination of Care  ?Interventions Coordination of Care/Mr. Eichhorn is a new patient of Dr. Worthy Flank and was see yesterday. Patient treatment plan is systemic therapy and port placement.  Will follow up on schedule and port.  Coordination of Care Coordination of Care Coordination of Care  ?Acuity Level 2-Minimal Needs (1-2 Barriers Identified) Level 2-Minimal Needs (1-2 Barriers Identified) Level 2-Minimal Needs (1-2 Barriers Identified) Level 3-Moderate Needs (3-4 Barriers Identified)  ?Coordination of Care Other Pathology Other Pathology;Other  ?Time Spent with Patient 30 30 15  45  ?  ?

## 2021-07-24 NOTE — Telephone Encounter (Signed)
Scheduled per 04/10 los, patient has been called and notified of all upcoming appointments. ?

## 2021-07-24 NOTE — Progress Notes (Signed)
Sent patient a my chart message with his treatment regimen information.  ?

## 2021-07-24 NOTE — Progress Notes (Signed)
Oncology Nurse Navigator Documentation ? ? ?  07/24/2021  ?  9:00 AM 07/24/2021  ?  8:00 AM 07/13/2021  ?  3:00 PM 07/11/2021  ?  9:00 AM 07/10/2021  ? 12:00 PM  ?Oncology Nurse Navigator Flowsheets  ?Abnormal Finding Date    05/29/2021   ?Confirmed Diagnosis Date    06/27/2021   ?Diagnosis Status    Confirmed Diagnosis Complete   ?Planned Course of Treatment  Chemotherapy;Targeted Therapy     ?Phase of Treatment  Targeted Therapy     ?Navigator Follow Up Date:  07/26/2021   07/23/2021  ?Navigator Follow Up Reason:  Appointment Review   New Patient Appointment  ?Navigator Location [facility]  ?Referral Date to RadOnc/MedOnc     07/10/2021  ?Navigator Encounter Type Telephone Appt/Treatment Plan Review Pathology Review;Molecular Studies Other: Other:  ?Telephone Outgoing Call      ?Patient Visit Type  Other     ?Treatment Phase Pre-Tx/Tx Discussion Pre-Tx/Tx Discussion   Pre-Tx/Tx Discussion  ?Barriers/Navigation Needs Coordination of Care;Education/I called radiology and set up patient's port appt. I called patient to update on port and pre-procedure instructions of nothing by mouth after 8 am, driver needed, and supervision for 24 hours after.  Patient verbalized understanding. Will check on medications going to the the [facility] and give patient information on his treatment medication.   Coordination of Care Coordination of Care Coordination of Care Coordination of Care  ?Education Other      ?Interventions Coordination of Care;Psycho-Social Support;Education Coordination of Care Coordination of Care Coordination of Care Coordination of Care  ?Acuity Level 3-Moderate Needs (3-4 Barriers Identified) Level 2-Minimal Needs (1-2 Barriers Identified) Level 2-Minimal Needs (1-2 Barriers Identified) Level 2-Minimal Needs (1-2 Barriers Identified) Level 3-Moderate Needs (3-4 Barriers Identified)  ?Coordination of Care Appts Other Pathology Other  Pathology;Other  ?Education Method Verbal      ?Time Spent with Patient 45 30 30 15  45  ?  ?

## 2021-07-26 ENCOUNTER — Other Ambulatory Visit (HOSPITAL_COMMUNITY): Payer: Self-pay | Admitting: Physician Assistant

## 2021-07-27 ENCOUNTER — Encounter (HOSPITAL_COMMUNITY): Payer: Self-pay

## 2021-07-27 ENCOUNTER — Ambulatory Visit (HOSPITAL_COMMUNITY)
Admission: RE | Admit: 2021-07-27 | Discharge: 2021-07-27 | Disposition: A | Discharge status: Home / Self Care | Payer: Commercial Managed Care - PPO | Source: Ambulatory Visit | Attending: Internal Medicine | Admitting: Internal Medicine

## 2021-07-27 ENCOUNTER — Inpatient Hospital Stay: Payer: Commercial Managed Care - PPO

## 2021-07-27 ENCOUNTER — Encounter: Payer: Self-pay | Admitting: *Deleted

## 2021-07-27 DIAGNOSIS — C3491 Malignant neoplasm of unspecified part of right bronchus or lung: Secondary | ICD-10-CM | POA: Diagnosis present

## 2021-07-27 DIAGNOSIS — Z87891 Personal history of nicotine dependence: Secondary | ICD-10-CM | POA: Diagnosis not present

## 2021-07-27 DIAGNOSIS — C782 Secondary malignant neoplasm of pleura: Secondary | ICD-10-CM | POA: Insufficient documentation

## 2021-07-27 DIAGNOSIS — F909 Attention-deficit hyperactivity disorder, unspecified type: Secondary | ICD-10-CM | POA: Insufficient documentation

## 2021-07-27 HISTORY — DX: Other complications of anesthesia, initial encounter: T88.59XA

## 2021-07-27 HISTORY — PX: IR IMAGING GUIDED PORT INSERTION: IMG5740

## 2021-07-27 LAB — FUNGUS CULTURE WITH STAIN

## 2021-07-27 LAB — FUNGAL ORGANISM REFLEX

## 2021-07-27 LAB — FUNGUS CULTURE RESULT

## 2021-07-27 MED ORDER — FENTANYL CITRATE (PF) 100 MCG/2ML IJ SOLN
INTRAMUSCULAR | Status: AC | PRN
  Administered 2021-07-27 (×2): 50 ug via INTRAVENOUS

## 2021-07-27 MED ORDER — HEPARIN SOD (PORK) LOCK FLUSH 100 UNIT/ML IV SOLN
INTRAVENOUS | Status: AC
  Filled 2021-07-27: qty 5

## 2021-07-27 MED ORDER — LIDOCAINE-EPINEPHRINE 1 %-1:100000 IJ SOLN
INTRAMUSCULAR | Status: AC
  Filled 2021-07-27: qty 1

## 2021-07-27 MED ORDER — FENTANYL CITRATE (PF) 100 MCG/2ML IJ SOLN
INTRAMUSCULAR | Status: AC
  Filled 2021-07-27: qty 2

## 2021-07-27 MED ORDER — SODIUM CHLORIDE 0.9 % IV SOLN
INTRAVENOUS | Status: DC
Start: 2021-07-27 — End: 2021-07-28

## 2021-07-27 MED ORDER — LIDOCAINE HCL 1 % IJ SOLN
INTRAMUSCULAR | Status: AC | PRN
  Administered 2021-07-27: 20 mL

## 2021-07-27 MED ORDER — MIDAZOLAM HCL 2 MG/2ML IJ SOLN
INTRAMUSCULAR | Status: AC
  Filled 2021-07-27: qty 4

## 2021-07-27 MED ORDER — HEPARIN SOD (PORK) LOCK FLUSH 100 UNIT/ML IV SOLN
INTRAVENOUS | Status: AC | PRN
  Administered 2021-07-27: 500 [IU] via INTRAVENOUS

## 2021-07-27 MED ORDER — MIDAZOLAM HCL 2 MG/2ML IJ SOLN
INTRAMUSCULAR | Status: AC | PRN
  Administered 2021-07-27: 2 mg via INTRAVENOUS
  Administered 2021-07-27 (×2): 1 mg via INTRAVENOUS

## 2021-07-27 MED FILL — Dexamethasone Sodium Phosphate Inj 100 MG/10ML: INTRAMUSCULAR | Qty: 1 | Status: AC

## 2021-07-27 MED FILL — Fosaprepitant Dimeglumine For IV Infusion 150 MG (Base Eq): INTRAVENOUS | Qty: 5 | Status: AC

## 2021-07-27 NOTE — Consult Note (Signed)
? ?Chief Complaint: ?Patient was seen in consultation today for port a cath placement ? ?Referring Physician(s): ?Mohamed,Mohamed ? ?Supervising Physician: Michaelle Birks ? ?Patient Status: [place] ? ?History of Present Illness: ?[name] Potts is a 51 y.o. male , ex smoker, with PMH sig for ADHD, chronic back pain, seasonal allergies and now with newly diagnosed stage IV right lung adenocarcinoma.  He presented with right lower lobe lung nodule in addition to right paratracheal and subcarinal lymphadenopathy and malignant right pleural effusion as well as pleural metastatic disease last month.  He is scheduled today for Port-A-Cath placement to assist with treatment. ? ?Past Medical History:  ?Diagnosis Date  ? ADHD (attention deficit hyperactivity disorder)   ? Chronic back pain   ? Constipation   ? Dyspnea   ? Pneumonia   ? Seasonal allergies   ? ? ?Past Surgical History:  ?Procedure Laterality Date  ? COLONOSCOPY  10/11/2011  ? Procedure: COLONOSCOPY;  Surgeon: Daneil Dolin, MD;  [facility];  Service: Endoscopy;  Laterality: N/A;  1:45 PM  ? COLONOSCOPY N/A 01/23/2018  ? Procedure: COLONOSCOPY;  Surgeon: Daneil Dolin, MD;  [facility];  Service: Endoscopy;  Laterality: N/A;  1:00  ? NECK SURGERY    ? Apr 05, 2021- per pt report cervical surgery at day surgery center  ? POLYPECTOMY  01/23/2018  ? Procedure: POLYPECTOMY;  Surgeon: Daneil Dolin, MD;  [facility];  Service: Endoscopy;;  ? VIDEO ASSISTED THORACOSCOPY (VATS)/DECORTICATION Right 06/27/2021  ? Procedure: VIDEO ASSISTED THORACOSCOPY (VATS), Drainage of RIGHT pleural effusion/ DECORTICATION;  Surgeon: Melrose Nakayama, MD;  Location: [place];  Service: Thoracic;  Laterality: Right;  ? ? ?Allergies: ?Patient has no known allergies. ? ?Medications: ?Prior to Admission medications   ?Medication Sig Start Date End Date Taking? Authorizing Provider  ?apixaban (ELIQUIS) 5 MG TABS tablet Take 2 tablets (10mg )  twice daily for 7 days, then 1 tablet (5mg ) twice daily 07/17/21  Yes Melrose Nakayama, MD  ?ALPRAZolam Duanne Moron) 0.25 MG tablet Take 1 tablet (0.25 mg total) by mouth 2 (two) times daily as needed for anxiety. 07/06/21   Melrose Nakayama, MD  ?atorvastatin (LIPITOR) 40 MG tablet Take 1 tablet (40 mg total) by mouth daily. 07/03/20   Noreene Larsson, NP  ?FeFum-FePoly-FA-B Cmp-C-Biot (INTEGRA PLUS) CAPS Take 1 capsule by mouth daily. 07/23/21   Curt Bears, MD  ?folic acid (FOLVITE) 1 MG tablet Take 1 tablet (1 mg total) by mouth daily. 07/23/21   Curt Bears, MD  ?lidocaine-prilocaine (EMLA) cream Apply to the Port-A-Cath site 30 minutes before chemotherapy 07/23/21   Curt Bears, MD  ?loratadine (CLARITIN) 10 MG tablet Take 10 mg by mouth every evening.    [provider]  ?mirtazapine (REMERON) 30 MG tablet Take 1 tablet (30 mg total) by mouth at bedtime. 07/23/21   Curt Bears, MD  ?morphine (MS CONTIN) 15 MG 12 hr tablet Take 2 tablets (30 mg total) by mouth every 12 (twelve) hours. 07/23/21   Curt Bears, MD  ?oxyCODONE (OXY IR/ROXICODONE) 5 MG immediate release tablet Take 1 tablet (5 mg total) by mouth every 6 (six) hours as needed for moderate pain or severe pain. 07/17/21   Melrose Nakayama, MD  ?prochlorperazine (COMPAZINE) 10 MG tablet Take 1 tablet (10 mg total) by mouth every 6 (six) hours as needed for nausea or vomiting. 07/23/21   Curt Bears, MD  ?  ? ?Family History  ?Problem  Relation Age of Onset  ? Colon cancer Mother 71  ?     passed at age 6  ? Stroke Father   ? ? ?Social History  ? ?Socioeconomic History  ? Marital status: Married  ?  Spouse name: Not on file  ? Number of children: 1  ? Years of education: Not on file  ? Highest education level: Not on file  ?Occupational History  ? Occupation: Librarian, academic  ?Tobacco Use  ? Smoking status: Former  ?  Packs/day: 0.50  ?  Types: Cigarettes  ?  Quit date: 03/15/2021  ?  Years since quitting: 0.3  ?  Smokeless tobacco: Current  ?  Types: Snuff  ? Tobacco comments:  ?  Using nicotine pouches  ?Vaping Use  ? Vaping Use: Former  ?Substance and Sexual Activity  ? Alcohol use: Not Currently  ?  Comment: has not used since 20s  ? Drug use: Not Currently  ?  Types: Marijuana  ?  Comment: has not used since college  ? Sexual activity: Yes  ?Other Topics Concern  ? Not on file  ?Social History Narrative  ? Not on file  ? ?Social Determinants of Health  ? ?Financial Resource Strain: Not on file  ?Food Insecurity: Not on file  ?Transportation Needs: Not on file  ?Physical Activity: Not on file  ?Stress: Not on file  ?Social Connections: Not on file  ? ? ? ? ?Review of Systems currently denies fever, headache, chest pain, worsening dyspnea, cough, abdominal pain, nausea, vomiting or bleeding. ? ?Vital Signs: ?BP 122/82   Pulse 97   Temp 98.4 ?F (36.9 ?C) (Oral)   Resp 18   Ht 6' (1.829 m)   Wt 179 lb 14.4 oz (81.6 kg)   SpO2 99%   BMI 24.40 kg/m?  ? ?Physical Exam awake, alert.  Chest with diminished breath sounds on right, left clear.  Heart with regular rate and rhythm.  Abdomen soft, positive bowel sounds, nontender.  No lower extremity edema. ? ?Imaging: ?DG Chest 2 View ? ?Result Date: 07/17/2021 ?CLINICAL DATA:  Lung cancer status post VATS on 06/27/2021 EXAM: CHEST - 2 VIEW COMPARISON:  07/10/2021 FINDINGS: The heart size and mediastinal contours are within normal limits. Similar size of small right pleural effusion with suspected residual small locules of air at its basilar component. Improving aeration of the right lung base with linear scarring/atelectasis centrally. Left lung remains clear. Resolved subcutaneous emphysema. IMPRESSION: Similar size of small right pleural effusion with suspected residual small locules of air at its basilar component. Improving aeration of the right lung base with linear scarring/atelectasis centrally. Electronically Signed   By: Davina Poke D.O.   On: 07/17/2021 12:55   ? ?DG Chest 2 View ? ?Result Date: 07/10/2021 ?CLINICAL DATA:  Pleural effusion status post chest tube removal EXAM: CHEST - 2 VIEW COMPARISON:  Chest x-ray dated July 10, 2021 FINDINGS: Visualized cardiac and mediastinal contours are unchanged. Small right pleural effusion with associated small locules air, compatible with hydropneumothorax, unchanged in size when compared with prior exam. Interval removal of right-sided chest tube. Soft tissue gas seen along the right chest wall. Unchanged heterogeneous opacities of the right lung. Left lung is clear. IMPRESSION: Unchanged size of small right hydropneumothorax status post chest tube removal. Electronically Signed   By: Yetta Glassman M.D.   On: 07/10/2021 12:38  ? ?DG Chest 2 View ? ?Result Date: 07/10/2021 ?CLINICAL DATA:  Lung cancer.  Right VATS 06/27/2021. EXAM: CHEST -  2 VIEW COMPARISON:  07/04/2021 FINDINGS: Right chest tube in place. Reduced but not completely resolved subcutaneous edema along the right hemithorax. Slightly improved aeration laterally in the right mid lung and right lung base although some right mid lung and right basilar airspace opacities persist. There continues to be right pleural fluid along with some horizontal potential air-fluid levels along loculations at the right lung base and potentially anterior right mid lung raising the possibility of persistent mildly loculated hydropneumothorax. Mild levoconvex thoracic scoliosis.  The left lung remains clear. IMPRESSION: 1. Minimally improved aeration laterally in the right chest. Persistent right pleural fluid and gas densities primarily at the right lung base with some potential air fluid levels suggesting loculated small hydropneumothorax component. There continued airspace opacities in the right mid lung and right lung base. 2. Reduced subcutaneous emphysema. Electronically Signed   By: Van Clines M.D.   On: 07/10/2021 11:24  ? ?CT HEAD W & WO CONTRAST (5MM) ? ?Result Date:  07/01/2021 ?CLINICAL DATA:  51 year old male status post VATS, decortication and pleural biopsies 3 days ago for recurrent effusion. Non-small cell lung cancer staging. EXAM: CT HEAD WITHOUT AND WITH CONTRAST TECHNIQUE

## 2021-07-27 NOTE — Procedures (Signed)
Vascular and Interventional Radiology Procedure Note ? ?Patient: [name] Potts ?DOB: [birth year] ?Medical Record Number: [id number] ?Note Date/Time: 07/27/21 1:40 PM  ? ?Performing Physician: Michaelle Birks, MD ?Assistant(s): None ? ?Diagnosis: Lung cancer ? ?Procedure: PORT PLACEMENT ? ?Anesthesia: Conscious Sedation ?Complications: None ?Estimated Blood Loss: Minimal ? ?Findings:  ?Successful left-sided port placement, with the tip of the catheter in the proximal right atrium. ? ?Plan: Catheter ready for use. ? ?See detailed procedure note with images in PACS. ?The patient tolerated the procedure well without incident or complication and was returned to Recovery in stable condition.  ? ? ?Michaelle Birks, MD ?[facility] ?[facility] ? ? ?Pager. [phone] ?Clinic. [phone]  ?

## 2021-07-27 NOTE — Progress Notes (Signed)
Notified Norton Blizzard, PA that Pt was recently sedated but leaving short stay 30 min post sedation to attend a cancer center Chemo class. She verbalized approval. Pt transported in wheelchair /  meet spouse [name] at reception desk.  ?

## 2021-07-27 NOTE — Discharge Instructions (Addendum)
Please call Interventional Radiology clinic [phone] with any questions or concerns.  ? ?You may remove your dressing and shower tomorrow. Do not submerge in water until site is healed. ? ?DO NOT use EMLA cream on your port site for 2 weeks as this cream will remove surgical glue on your incision. ? ?Implanted Port Insertion, Care After ?This sheet gives you information about how to care for yourself after your procedure. Your health care provider may also give you more specific instructions. If you have problems or questions, contact your health care provider. ?What can I expect after the procedure? ?After the procedure, it is common to have: ?Discomfort at the port insertion site. ?Bruising on the skin over the port. This should improve over 3-4 days. ?Follow these instructions at home: ?Port care ?After your port is placed, you will get a manufacturer's information card. The card has information about your port. Keep this card with you at all times. ?Take care of the port as told by your health care provider. Ask your health care provider if you or a family member can get training for taking care of the port at home. A home health care nurse may also take care of the port. ?Make sure to remember what type of port you have. ?Incision care ?Follow instructions from your health care provider about how to take care of your port insertion site. Make sure you: ?Wash your hands with soap and water before and after you change your bandage (dressing). If soap and water are not available, use hand sanitizer. ?Change your dressing as told by your health care provider. ?Leave stitches (sutures), skin glue, or adhesive strips in place. These skin closures may need to stay in place for 2 weeks or longer. If adhesive strip edges start to loosen and curl up, you may trim the loose edges. Do not remove adhesive strips completely unless your health care provider tells you to do that. ?Check your port insertion site every day  for signs of infection. Check for: ?Redness, swelling, or pain. ?Fluid or blood. ?Warmth. ?Pus or a bad smell.  ? ?  ? ?  ?Activity ?Return to your normal activities as told by your health care provider. Ask your health care provider what activities are safe for you. ?Do not lift anything that is heavier than 10 lb (4.5 kg), or the limit that you are told, until your health care provider says that it is safe. ?General instructions ?Take over-the-counter and prescription medicines only as told by your health care provider. ?Do not take baths, swim, or use a hot tub until your health care provider approves. Ask your health care provider if you may take showers. You may only be allowed to take sponge baths. ?Do not drive for 24 hours if you were given a sedative during your procedure. ?Wear a medical alert bracelet in case of an emergency. This will tell any health care providers that you have a port. ?Keep all follow-up visits as told by your health care provider. This is important. ?Contact a health care provider if: ?You cannot flush your port with saline as directed, or you cannot draw blood from the port. ?You have a fever or chills. ?You have redness, swelling, or pain around your port insertion site. ?You have fluid or blood coming from your port insertion site. ?Your port insertion site feels warm to the touch. ?You have pus or a bad smell coming from the port insertion site. ?Get help right away if: ?You have  chest pain or shortness of breath. ?You have bleeding from your port that you cannot control. ?Summary ?Take care of the port as told by your health care provider. Keep the manufacturer's information card with you at all times. ?Change your dressing as told by your health care provider. ?Contact a health care provider if you have a fever or chills or if you have redness, swelling, or pain around your port insertion site. ?Keep all follow-up visits as told by your health care provider. ?This information is  not intended to replace advice given to you by your health care provider. Make sure you discuss any questions you have with your health care provider. ?Document Revised: 10/28/2017 Document Reviewed: 10/28/2017 ?Elsevier Patient Education ? [place]. ? ? ?Moderate Conscious Sedation, Adult, Care After ?This sheet gives you information about how to care for yourself after your procedure. Your health care provider may also give you more specific instructions. If you have problems or questions, contact your health care provider. ?What can I expect after the procedure? ?After the procedure, it is common to have: ?Sleepiness for several hours. ?Impaired judgment for several hours. ?Difficulty with balance. ?Vomiting if you eat too soon. ?Follow these instructions at home: ?For the time period you were told by your health care provider: ?Rest. ?Do not participate in activities where you could fall or become injured. ?Do not drive or use machinery. ?Do not drink alcohol. ?Do not take sleeping pills or medicines that cause drowsiness. ?Do not make important decisions or sign legal documents. ?Do not take care of children on your own.  ? ?  ? ?  ?Eating and drinking ?Follow the diet recommended by your health care provider. ?Drink enough fluid to keep your urine pale yellow. ?If you vomit: ?Drink water, juice, or soup when you can drink without vomiting. ?Make sure you have little or no nausea before eating solid foods.  ?  ?General instructions ?Take over-the-counter and prescription medicines only as told by your health care provider. ?Have a responsible adult stay with you for the time you are told. It is important to have someone help care for you until you are awake and alert. ?Do not smoke. ?Keep all follow-up visits as told by your health care provider. This is important. ?Contact a health care provider if: ?You are still sleepy or having trouble with balance after 24 hours. ?You feel light-headed. ?You keep  feeling nauseous or you keep vomiting. ?You develop a rash. ?You have a fever. ?You have redness or swelling around the IV site. ?Get help right away if: ?You have trouble breathing. ?You have new-onset confusion at home. ?Summary ?After the procedure, it is common to feel sleepy, have impaired judgment, or feel nauseous if you eat too soon. ?Rest after you get home. Know the things you should not do after the procedure. ?Follow the diet recommended by your health care provider and drink enough fluid to keep your urine pale yellow. ?Get help right away if you have trouble breathing or new-onset confusion at home. ?This information is not intended to replace advice given to you by your health care provider. Make sure you discuss any questions you have with your health care provider. ?Document Revised: 07/30/2019 Document Reviewed: 02/25/2019 ?Elsevier Patient Education ? [place].  ?

## 2021-07-27 NOTE — Progress Notes (Signed)
Oncology Nurse Navigator Documentation ? ? ?  07/27/2021  ?  1:00 PM 07/24/2021  ?  9:00 AM 07/24/2021  ?  8:00 AM 07/13/2021  ?  3:00 PM 07/11/2021  ?  9:00 AM 07/10/2021  ? 12:00 PM  ?Oncology Nurse Navigator Flowsheets  ?Abnormal Finding Date     05/29/2021   ?Confirmed Diagnosis Date     06/27/2021   ?Diagnosis Status     Confirmed Diagnosis Complete   ?Planned Course of Treatment Chemotherapy;Targeted Therapy  Chemotherapy;Targeted Therapy     ?Phase of Treatment Targeted Therapy  Targeted Therapy     ?Chemotherapy Actual Start Date: 08/06/2021       ?Targeted Therapy Actual Start Date: 08/06/2021       ?Navigator Follow Up Date: 08/20/2021  07/26/2021   07/23/2021  ?Navigator Follow Up Reason: Follow-up Appointment  Appointment Review   New Patient Appointment  ?Navigator Location [facility]  ?Referral Date to RadOnc/MedOnc      07/10/2021  ?Navigator Encounter Type Other: Telephone Appt/Treatment Plan Review Pathology Review;Molecular Studies Other: Other:  ?Telephone  Outgoing Call      ?Patient Visit Type   Other     ?Treatment Phase Pre-Tx/Tx Discussion Pre-Tx/Tx Discussion Pre-Tx/Tx Discussion   Pre-Tx/Tx Discussion  ?Barriers/Navigation Needs Coordination of Care/I followed up on Mr. Prices appts. He is scheduled for his tx plan at this time.   Coordination of Care;Education Coordination of Care Coordination of Care Coordination of Care Coordination of Care  ?Education  Other      ?Interventions Coordination of Care Coordination of Care;Psycho-Social Support;Education Coordination of Care Coordination of Care Coordination of Care Coordination of Care  ?Acuity Level 2-Minimal Needs (1-2 Barriers Identified) Level 3-Moderate Needs (3-4 Barriers Identified) Level 2-Minimal Needs (1-2 Barriers Identified) Level 2-Minimal Needs (1-2 Barriers Identified) Level 2-Minimal Needs (1-2 Barriers Identified) Level 3-Moderate Needs (3-4  Barriers Identified)  ?Coordination of Care Other Appts Other Pathology Other Pathology;Other  ?Education Method  Verbal      ?Time Spent with Patient 30 45 30 30 15  45  ?  ?

## 2021-07-30 ENCOUNTER — Telehealth: Payer: Self-pay | Admitting: Internal Medicine

## 2021-07-30 ENCOUNTER — Inpatient Hospital Stay: Payer: Commercial Managed Care - PPO

## 2021-07-30 NOTE — Telephone Encounter (Signed)
.  Called patient to schedule appointment per 4/17 inbasket, patient is aware of date and time.   ?

## 2021-07-30 NOTE — Progress Notes (Signed)
Pharmacist Chemotherapy Monitoring - Initial Assessment   ? ?Anticipated start date: 08/06/21  ? ?The following has been reviewed per standard work regarding the patient's treatment regimen: ?The patient's diagnosis, treatment plan and drug doses, and organ/hematologic function ?Lab orders and baseline tests specific to treatment regimen  ?The treatment plan start date, drug sequencing, and pre-medications ?Prior authorization status  ?Patient's documented medication list, including drug-drug interaction screen and prescriptions for anti-emetics and supportive care specific to the treatment regimen ?The drug concentrations, fluid compatibility, administration routes, and timing of the medications to be used ?The patient's access for treatment and lifetime cumulative dose history, if applicable  ?The patient's medication allergies and previous infusion related reactions, if applicable  ? ?Changes made to treatment plan:  ?treatment plan date ? ?Follow up needed:  ?N/A ? ? ?Kennith Center, Pharm.D., CPP ?07/30/2021@10 :19 AM ? ? ? ?

## 2021-08-01 ENCOUNTER — Inpatient Hospital Stay: Payer: Commercial Managed Care - PPO

## 2021-08-01 ENCOUNTER — Other Ambulatory Visit: Payer: Self-pay

## 2021-08-01 VITALS — BP 116/87 | HR 96 | Temp 98.4°F | Resp 16

## 2021-08-01 DIAGNOSIS — Z95828 Presence of other vascular implants and grafts: Secondary | ICD-10-CM

## 2021-08-01 DIAGNOSIS — C3432 Malignant neoplasm of lower lobe, left bronchus or lung: Secondary | ICD-10-CM | POA: Diagnosis not present

## 2021-08-01 DIAGNOSIS — C3491 Malignant neoplasm of unspecified part of right bronchus or lung: Secondary | ICD-10-CM

## 2021-08-01 LAB — CBC WITH DIFFERENTIAL (CANCER CENTER ONLY)
Abs Immature Granulocytes: 0.04 10*3/uL (ref 0.00–0.07)
Basophils Absolute: 0 10*3/uL (ref 0.0–0.1)
Basophils Relative: 0 %
Eosinophils Absolute: 0.1 10*3/uL (ref 0.0–0.5)
Eosinophils Relative: 1 %
HCT: 31.5 % — ABNORMAL LOW (ref 39.0–52.0)
Hemoglobin: 9.9 g/dL — ABNORMAL LOW (ref 13.0–17.0)
Immature Granulocytes: 0 %
Lymphocytes Relative: 14 %
Lymphs Abs: 1.5 10*3/uL (ref 0.7–4.0)
MCH: 25.8 pg — ABNORMAL LOW (ref 26.0–34.0)
MCHC: 31.4 g/dL (ref 30.0–36.0)
MCV: 82 fL (ref 80.0–100.0)
Monocytes Absolute: 0.7 10*3/uL (ref 0.1–1.0)
Monocytes Relative: 6 %
Neutro Abs: 8.3 10*3/uL — ABNORMAL HIGH (ref 1.7–7.7)
Neutrophils Relative %: 79 %
Platelet Count: 292 10*3/uL (ref 150–400)
RBC: 3.84 MIL/uL — ABNORMAL LOW (ref 4.22–5.81)
RDW: 14.7 % (ref 11.5–15.5)
WBC Count: 10.6 10*3/uL — ABNORMAL HIGH (ref 4.0–10.5)
nRBC: 0 % (ref 0.0–0.2)

## 2021-08-01 LAB — TSH: TSH: 1.365 u[IU]/mL (ref 0.350–4.500)

## 2021-08-01 LAB — CMP (CANCER CENTER ONLY)
ALT: 15 U/L (ref 0–44)
AST: 17 U/L (ref 15–41)
Albumin: 3.5 g/dL (ref 3.5–5.0)
Alkaline Phosphatase: 146 U/L — ABNORMAL HIGH (ref 38–126)
Anion gap: 9 (ref 5–15)
BUN: 12 mg/dL (ref 6–20)
CO2: 27 mmol/L (ref 22–32)
Calcium: 9.1 mg/dL (ref 8.9–10.3)
Chloride: 100 mmol/L (ref 98–111)
Creatinine: 0.98 mg/dL (ref 0.61–1.24)
GFR, Estimated: >60 mL/min (ref >60)
Glucose, Bld: 186 mg/dL — ABNORMAL HIGH (ref 70–99)
Potassium: 3.8 mmol/L (ref 3.5–5.1)
Sodium: 136 mmol/L (ref 135–145)
Total Bilirubin: 0.4 mg/dL (ref 0.3–1.2)
Total Protein: 7.5 g/dL (ref 6.5–8.1)

## 2021-08-01 MED ORDER — SODIUM CHLORIDE 0.9 % IV SOLN: Freq: Once | INTRAVENOUS | Status: AC

## 2021-08-01 MED ORDER — SODIUM CHLORIDE 0.9% FLUSH
10.0000 mL | INTRAVENOUS | Status: DC | PRN
  Administered 2021-08-01: 10 mL

## 2021-08-01 MED ORDER — SODIUM CHLORIDE 0.9 % IV SOLN
150.0000 mg | Freq: Once | INTRAVENOUS | Status: AC
  Administered 2021-08-01: 150 mg via INTRAVENOUS
  Filled 2021-08-01: qty 150

## 2021-08-01 MED ORDER — SODIUM CHLORIDE 0.9% FLUSH
10.0000 mL | INTRAVENOUS | Status: DC | PRN
  Administered 2021-08-01: 10 mL via INTRAVENOUS

## 2021-08-01 MED ORDER — SODIUM CHLORIDE 0.9 % IV SOLN
200.0000 mg | Freq: Once | INTRAVENOUS | Status: AC
  Administered 2021-08-01: 200 mg via INTRAVENOUS
  Filled 2021-08-01: qty 200

## 2021-08-01 MED ORDER — HEPARIN SOD (PORK) LOCK FLUSH 100 UNIT/ML IV SOLN
500.0000 [IU] | Freq: Once | INTRAVENOUS | Status: AC | PRN
  Administered 2021-08-01: 500 [IU]

## 2021-08-01 MED ORDER — PALONOSETRON HCL INJECTION 0.25 MG/5ML
0.2500 mg | Freq: Once | INTRAVENOUS | Status: AC
  Administered 2021-08-01: 0.25 mg via INTRAVENOUS

## 2021-08-01 MED ORDER — SODIUM CHLORIDE 0.9 % IV SOLN
620.0000 mg | Freq: Once | INTRAVENOUS | Status: AC
  Administered 2021-08-01: 620 mg via INTRAVENOUS
  Filled 2021-08-01: qty 62

## 2021-08-01 MED ORDER — SODIUM CHLORIDE 0.9 % IV SOLN
10.0000 mg | Freq: Once | INTRAVENOUS | Status: AC
  Administered 2021-08-01: 10 mg via INTRAVENOUS
  Filled 2021-08-01: qty 10

## 2021-08-01 MED ORDER — SODIUM CHLORIDE 0.9 % IV SOLN
500.0000 mg/m2 | Freq: Once | INTRAVENOUS | Status: AC
  Administered 2021-08-01: 1000 mg via INTRAVENOUS
  Filled 2021-08-01: qty 40

## 2021-08-01 NOTE — Progress Notes (Signed)
Per MD dose adjust to 620 mg ?

## 2021-08-01 NOTE — Patient Instructions (Addendum)
[place] ?Discharge Instructions for Patients Receiving Chemotherapy ? ?Today you received the following chemotherapy agents Keytruda/alimta/carboplatin  ? ?To help prevent nausea and vomiting after your treatment, we encourage you to take your nausea medication as directed.   ?  ?If you develop nausea and vomiting that is not controlled by your nausea medication, call the clinic.  ? ?BELOW ARE SYMPTOMS THAT SHOULD BE REPORTED IMMEDIATELY: ?*FEVER GREATER THAN 100.5 F ?*CHILLS WITH OR WITHOUT FEVER ?NAUSEA AND VOMITING THAT IS NOT CONTROLLED WITH YOUR NAUSEA MEDICATION ?*UNUSUAL SHORTNESS OF BREATH ?*UNUSUAL BRUISING OR BLEEDING ?TENDERNESS IN MOUTH AND THROAT WITH OR WITHOUT PRESENCE OF ULCERS ?*URINARY PROBLEMS ?*BOWEL PROBLEMS ?UNUSUAL RASH ?Items with * indicate a potential emergency and should be followed up as soon as possible. ? ?Feel free to call the clinic you have any questions or concerns. The clinic phone number is [phone]. ?Carboplatin injection ?What is this medication? ?CARBOPLATIN (KAR boe pla tin) is a chemotherapy drug. It targets fast dividing cells, like cancer cells, and causes these cells to die. This medicine is used to treat ovarian cancer and many other cancers. ?This medicine may be used for other purposes; ask your health care provider or pharmacist if you have questions. ?COMMON BRAND NAME(S): Paraplatin ?What should I tell my care team before I take this medication? ?They need to know if you have any of these conditions: ?blood disorders ?hearing problems ?kidney disease ?recent or ongoing radiation therapy ?an unusual or allergic reaction to carboplatin, cisplatin, other chemotherapy, other medicines, foods, dyes, or preservatives ?pregnant or trying to get pregnant ?breast-feeding ?How should I use this medication? ?This drug is usually given as an infusion into a vein. It is administered in a hospital or clinic by a specially trained health care  professional. ?Talk to your pediatrician regarding the use of this medicine in children. Special care may be needed. ?Overdosage: If you think you have taken too much of this medicine contact a poison control center or emergency room at once. ?NOTE: This medicine is only for you. Do not share this medicine with others. ?What if I miss a dose? ?It is important not to miss a dose. Call your doctor or health care professional if you are unable to keep an appointment. ?What may interact with this medication? ?medicines for seizures ?medicines to increase blood counts like filgrastim, pegfilgrastim, sargramostim ?some antibiotics like amikacin, gentamicin, neomycin, streptomycin, tobramycin ?vaccines ?Talk to your doctor or health care professional before taking any of these medicines: ?acetaminophen ?aspirin ?ibuprofen ?ketoprofen ?naproxen ?This list may not describe all possible interactions. Give your health care provider a list of all the medicines, herbs, non-prescription drugs, or dietary supplements you use. Also tell them if you smoke, drink alcohol, or use illegal drugs. Some items may interact with your medicine. ?What should I watch for while using this medication? ?Your condition will be monitored carefully while you are receiving this medicine. You will need important blood work done while you are taking this medicine. ?This drug may make you feel generally unwell. This is not uncommon, as chemotherapy can affect healthy cells as well as cancer cells. Report any side effects. Continue your course of treatment even though you feel ill unless your doctor tells you to stop. ?In some cases, you may be given additional medicines to help with side effects. Follow all directions for their use. ?Call your doctor or health care professional for advice if you get a fever, chills or sore throat, or other symptoms  of a cold or flu. Do not treat yourself. This drug decreases your body's ability to fight infections. Try  to avoid being around people who are sick. ?This medicine may increase your risk to bruise or bleed. Call your doctor or health care professional if you notice any unusual bleeding. ?Be careful brushing and flossing your teeth or using a toothpick because you may get an infection or bleed more easily. If you have any dental work done, tell your dentist you are receiving this medicine. ?Avoid taking products that contain aspirin, acetaminophen, ibuprofen, naproxen, or ketoprofen unless instructed by your doctor. These medicines may hide a fever. ?Do not become pregnant while taking this medicine. Women should inform their doctor if they wish to become pregnant or think they might be pregnant. There is a potential for serious side effects to an unborn child. Talk to your health care professional or pharmacist for more information. Do not breast-feed an infant while taking this medicine. ?What side effects may I notice from receiving this medication? ?Side effects that you should report to your doctor or health care professional as soon as possible: ?allergic reactions like skin rash, itching or hives, swelling of the face, lips, or tongue ?signs of infection - fever or chills, cough, sore throat, pain or difficulty passing urine ?signs of decreased platelets or bleeding - bruising, pinpoint red spots on the skin, black, tarry stools, nosebleeds ?signs of decreased red blood cells - unusually weak or tired, fainting spells, lightheadedness ?breathing problems ?changes in hearing ?changes in vision ?chest pain ?high blood pressure ?low blood counts - This drug may decrease the number of white blood cells, red blood cells and platelets. You may be at increased risk for infections and bleeding. ?nausea and vomiting ?pain, swelling, redness or irritation at the injection site ?pain, tingling, numbness in the hands or feet ?problems with balance, talking, walking ?trouble passing urine or change in the amount of urine ?Side  effects that usually do not require medical attention (report to your doctor or health care professional if they continue or are bothersome): ?hair loss ?loss of appetite ?metallic taste in the mouth or changes in taste ?This list may not describe all possible side effects. Call your doctor for medical advice about side effects. You may report side effects to FDA at 1-800-FDA-1088. ?Where should I keep my medication? ?This drug is given in a hospital or clinic and will not be stored at home. ?NOTE: This sheet is a summary. It may not cover all possible information. If you have questions about this medicine, talk to your doctor, pharmacist, or health care provider. ?? 2023 Elsevier/Gold Standard (2007-09-09 00:00:00) ?Pemetrexed injection ?What is this medication? ?PEMETREXED (PEM e TREX ed) is a chemotherapy drug used to treat lung cancers like non-small cell lung cancer and mesothelioma. It may also be used to treat other cancers. ?This medicine may be used for other purposes; ask your health care provider or pharmacist if you have questions. ?COMMON BRAND NAME(S): Alimta, PEMFEXY ?What should I tell my care team before I take this medication? ?They need to know if you have any of these conditions: ?infection (especially a virus infection such as chickenpox, cold sores, or herpes) ?kidney disease ?low blood counts, like low white cell, platelet, or red cell counts ?lung or breathing disease, like asthma ?radiation therapy ?an unusual or allergic reaction to pemetrexed, other medicines, foods, dyes, or preservative ?pregnant or trying to get pregnant ?breast-feeding ?How should I use this medication? ?This drug is  given as an infusion into a vein. It is administered in a hospital or clinic by a specially trained health care professional. ?Talk to your pediatrician regarding the use of this medicine in children. Special care may be needed. ?Overdosage: If you think you have taken too much of this medicine contact a  poison control center or emergency room at once. ?NOTE: This medicine is only for you. Do not share this medicine with others. ?What if I miss a dose? ?It is important not to miss your dose. Call your doctor or heal

## 2021-08-02 ENCOUNTER — Telehealth: Payer: Self-pay

## 2021-08-02 NOTE — Telephone Encounter (Signed)
LM for patient that this nurse was calling to see how they were doing after their treatment. Please call back to Dr. Mohamed's nurse at [phone] if they have any questions or concerns regarding the treatment.  

## 2021-08-02 NOTE — Telephone Encounter (Signed)
-----   Message from Regan Rakers, RN sent at 08/01/2021  3:34 PM EDT ----- ?Regarding: first time chemo/mohamed ?First time keytruda/alimta/carbo.  Dr Julien Nordmann.  Tolerated well.   ? ?

## 2021-08-05 ENCOUNTER — Inpatient Hospital Stay (HOSPITAL_COMMUNITY)
Admission: EM | Admit: 2021-08-05 | Discharge: 2021-08-07 | DRG: 683 | Disposition: A | Discharge status: Home / Self Care | Payer: Commercial Managed Care - PPO | Attending: Internal Medicine | Admitting: Internal Medicine

## 2021-08-05 ENCOUNTER — Other Ambulatory Visit: Payer: Self-pay

## 2021-08-05 ENCOUNTER — Observation Stay (HOSPITAL_COMMUNITY): Payer: Commercial Managed Care - PPO

## 2021-08-05 ENCOUNTER — Encounter (HOSPITAL_COMMUNITY): Payer: Self-pay | Admitting: Emergency Medicine

## 2021-08-05 DIAGNOSIS — Z79899 Other long term (current) drug therapy: Secondary | ICD-10-CM

## 2021-08-05 DIAGNOSIS — F909 Attention-deficit hyperactivity disorder, unspecified type: Secondary | ICD-10-CM | POA: Diagnosis present

## 2021-08-05 DIAGNOSIS — I82401 Acute embolism and thrombosis of unspecified deep veins of right lower extremity: Secondary | ICD-10-CM | POA: Diagnosis present

## 2021-08-05 DIAGNOSIS — K5900 Constipation, unspecified: Secondary | ICD-10-CM | POA: Diagnosis present

## 2021-08-05 DIAGNOSIS — I824Y1 Acute embolism and thrombosis of unspecified deep veins of right proximal lower extremity: Secondary | ICD-10-CM

## 2021-08-05 DIAGNOSIS — M545 Low back pain, unspecified: Secondary | ICD-10-CM | POA: Diagnosis present

## 2021-08-05 DIAGNOSIS — C3491 Malignant neoplasm of unspecified part of right bronchus or lung: Secondary | ICD-10-CM | POA: Diagnosis present

## 2021-08-05 DIAGNOSIS — Z20822 Contact with and (suspected) exposure to covid-19: Secondary | ICD-10-CM | POA: Diagnosis present

## 2021-08-05 DIAGNOSIS — J948 Other specified pleural conditions: Secondary | ICD-10-CM | POA: Diagnosis present

## 2021-08-05 DIAGNOSIS — E782 Mixed hyperlipidemia: Secondary | ICD-10-CM | POA: Diagnosis present

## 2021-08-05 DIAGNOSIS — Z86718 Personal history of other venous thrombosis and embolism: Secondary | ICD-10-CM

## 2021-08-05 DIAGNOSIS — Z7901 Long term (current) use of anticoagulants: Secondary | ICD-10-CM

## 2021-08-05 DIAGNOSIS — D61810 Antineoplastic chemotherapy induced pancytopenia: Secondary | ICD-10-CM | POA: Diagnosis present

## 2021-08-05 DIAGNOSIS — N179 Acute kidney failure, unspecified: Principal | ICD-10-CM | POA: Diagnosis present

## 2021-08-05 DIAGNOSIS — R110 Nausea: Secondary | ICD-10-CM | POA: Diagnosis not present

## 2021-08-05 DIAGNOSIS — E871 Hypo-osmolality and hyponatremia: Secondary | ICD-10-CM | POA: Diagnosis present

## 2021-08-05 DIAGNOSIS — G8929 Other chronic pain: Secondary | ICD-10-CM | POA: Diagnosis present

## 2021-08-05 DIAGNOSIS — T451X5A Adverse effect of antineoplastic and immunosuppressive drugs, initial encounter: Secondary | ICD-10-CM | POA: Diagnosis present

## 2021-08-05 DIAGNOSIS — E860 Dehydration: Secondary | ICD-10-CM

## 2021-08-05 DIAGNOSIS — Z79891 Long term (current) use of opiate analgesic: Secondary | ICD-10-CM

## 2021-08-05 LAB — COMPREHENSIVE METABOLIC PANEL
ALT: 56 U/L — ABNORMAL HIGH (ref 0–44)
AST: 51 U/L — ABNORMAL HIGH (ref 15–41)
Albumin: 3.4 g/dL — ABNORMAL LOW (ref 3.5–5.0)
Alkaline Phosphatase: 128 U/L — ABNORMAL HIGH (ref 38–126)
Anion gap: 10 (ref 5–15)
BUN: 32 mg/dL — ABNORMAL HIGH (ref 6–20)
CO2: 24 mmol/L (ref 22–32)
Calcium: 9.1 mg/dL (ref 8.9–10.3)
Chloride: 99 mmol/L (ref 98–111)
Creatinine, Ser: 1.66 mg/dL — ABNORMAL HIGH (ref 0.61–1.24)
GFR, Estimated: 49 mL/min — ABNORMAL LOW (ref >60)
Glucose, Bld: 127 mg/dL — ABNORMAL HIGH (ref 70–99)
Potassium: 4.2 mmol/L (ref 3.5–5.1)
Sodium: 133 mmol/L — ABNORMAL LOW (ref 135–145)
Total Bilirubin: 0.8 mg/dL (ref 0.3–1.2)
Total Protein: 7.6 g/dL (ref 6.5–8.1)

## 2021-08-05 LAB — CBC WITH DIFFERENTIAL/PLATELET
Abs Immature Granulocytes: 0.01 10*3/uL (ref 0.00–0.07)
Basophils Absolute: 0 10*3/uL (ref 0.0–0.1)
Basophils Relative: 0 %
Eosinophils Absolute: 0 10*3/uL (ref 0.0–0.5)
Eosinophils Relative: 0 %
HCT: 33.9 % — ABNORMAL LOW (ref 39.0–52.0)
Hemoglobin: 10.7 g/dL — ABNORMAL LOW (ref 13.0–17.0)
Immature Granulocytes: 0 %
Lymphocytes Relative: 14 %
Lymphs Abs: 0.9 10*3/uL (ref 0.7–4.0)
MCH: 25.7 pg — ABNORMAL LOW (ref 26.0–34.0)
MCHC: 31.6 g/dL (ref 30.0–36.0)
MCV: 81.5 fL (ref 80.0–100.0)
Monocytes Absolute: 0.1 10*3/uL (ref 0.1–1.0)
Monocytes Relative: 1 %
Neutro Abs: 5.4 10*3/uL (ref 1.7–7.7)
Neutrophils Relative %: 85 %
Platelets: 147 10*3/uL — ABNORMAL LOW (ref 150–400)
RBC: 4.16 MIL/uL — ABNORMAL LOW (ref 4.22–5.81)
RDW: 15.2 % (ref 11.5–15.5)
WBC: 6.4 10*3/uL (ref 4.0–10.5)
nRBC: 0 % (ref 0.0–0.2)

## 2021-08-05 LAB — VITAMIN B12: Vitamin B-12: 651 pg/mL (ref 180–914)

## 2021-08-05 LAB — IRON AND TIBC
Iron: 140 ug/dL (ref 45–182)
Saturation Ratios: 81 % — ABNORMAL HIGH (ref 17.9–39.5)
TIBC: 173 ug/dL — ABNORMAL LOW (ref 250–450)
UIBC: 33 ug/dL

## 2021-08-05 LAB — URINALYSIS, ROUTINE W REFLEX MICROSCOPIC
Bilirubin Urine: NEGATIVE
Glucose, UA: NEGATIVE mg/dL
Ketones, ur: NEGATIVE mg/dL
Leukocytes,Ua: NEGATIVE
Nitrite: NEGATIVE
Protein, ur: 100 mg/dL — AB
Specific Gravity, Urine: 1.03 (ref 1.005–1.030)
pH: 6 (ref 5.0–8.0)

## 2021-08-05 LAB — RESP PANEL BY RT-PCR (FLU A&B, COVID) ARPGX2
Influenza A by PCR: NEGATIVE
Influenza B by PCR: NEGATIVE
SARS Coronavirus 2 by RT PCR: NEGATIVE

## 2021-08-05 LAB — MAGNESIUM: Magnesium: 2.1 mg/dL (ref 1.7–2.4)

## 2021-08-05 LAB — PHOSPHORUS: Phosphorus: 5.1 mg/dL — ABNORMAL HIGH (ref 2.5–4.6)

## 2021-08-05 LAB — LACTIC ACID, PLASMA: Lactic Acid, Venous: 1.7 mmol/L (ref 0.5–1.9)

## 2021-08-05 LAB — FOLATE: Folate: >100 ng/mL (ref >5.9)

## 2021-08-05 MED ORDER — MIRTAZAPINE 15 MG PO TABS
30.0000 mg | ORAL_TABLET | Freq: Every day | ORAL | Status: DC
Start: 2021-08-05 — End: 2021-08-07
  Administered 2021-08-05 – 2021-08-06 (×2): 30 mg via ORAL
  Filled 2021-08-05 (×2): qty 2

## 2021-08-05 MED ORDER — ONDANSETRON HCL 4 MG PO TABS: 4.0000 mg | ORAL_TABLET | Freq: Four times a day (QID) | ORAL | Status: DC | PRN

## 2021-08-05 MED ORDER — PROCHLORPERAZINE EDISYLATE 10 MG/2ML IJ SOLN
10.0000 mg | Freq: Four times a day (QID) | INTRAMUSCULAR | Status: DC | PRN
  Administered 2021-08-05 – 2021-08-06 (×3): 10 mg via INTRAVENOUS
  Filled 2021-08-05 (×3): qty 2

## 2021-08-05 MED ORDER — SODIUM CHLORIDE 0.9 % IV SOLN: Freq: Once | INTRAVENOUS | Status: AC

## 2021-08-05 MED ORDER — MORPHINE SULFATE (PF) 4 MG/ML IV SOLN
4.0000 mg | Freq: Once | INTRAVENOUS | Status: AC
  Administered 2021-08-05: 4 mg via INTRAVENOUS
  Filled 2021-08-05: qty 1

## 2021-08-05 MED ORDER — ATORVASTATIN CALCIUM 40 MG PO TABS: 40.0000 mg | ORAL_TABLET | Freq: Every day | ORAL | Status: DC

## 2021-08-05 MED ORDER — ONDANSETRON HCL 4 MG/2ML IJ SOLN
4.0000 mg | Freq: Once | INTRAMUSCULAR | Status: AC
  Administered 2021-08-05: 4 mg via INTRAVENOUS
  Filled 2021-08-05: qty 2

## 2021-08-05 MED ORDER — ONDANSETRON HCL 4 MG/2ML IJ SOLN
4.0000 mg | Freq: Four times a day (QID) | INTRAMUSCULAR | Status: DC | PRN
  Administered 2021-08-06 – 2021-08-07 (×2): 4 mg via INTRAVENOUS
  Filled 2021-08-05 (×2): qty 2

## 2021-08-05 MED ORDER — ALPRAZOLAM 0.25 MG PO TABS: 0.2500 mg | ORAL_TABLET | Freq: Two times a day (BID) | ORAL | Status: DC | PRN

## 2021-08-05 MED ORDER — POLYETHYLENE GLYCOL 3350 17 G PO PACK
17.0000 g | PACK | Freq: Every day | ORAL | Status: DC | PRN
  Administered 2021-08-06: 17 g via ORAL
  Filled 2021-08-05: qty 1

## 2021-08-05 MED ORDER — MORPHINE SULFATE ER 30 MG PO TBCR
30.0000 mg | EXTENDED_RELEASE_TABLET | Freq: Two times a day (BID) | ORAL | Status: DC
  Administered 2021-08-05 – 2021-08-07 (×4): 30 mg via ORAL
  Filled 2021-08-05 (×4): qty 1

## 2021-08-05 MED ORDER — LORATADINE 10 MG PO TABS
10.0000 mg | ORAL_TABLET | Freq: Every evening | ORAL | Status: DC
Start: 2021-08-05 — End: 2021-08-05

## 2021-08-05 MED ORDER — OXYCODONE HCL 5 MG PO TABS
5.0000 mg | ORAL_TABLET | Freq: Four times a day (QID) | ORAL | Status: DC | PRN
  Administered 2021-08-05 – 2021-08-06 (×2): 5 mg via ORAL
  Filled 2021-08-05 (×2): qty 1

## 2021-08-05 MED ORDER — ACETAMINOPHEN 650 MG RE SUPP: 650.0000 mg | Freq: Four times a day (QID) | RECTAL | Status: DC | PRN

## 2021-08-05 MED ORDER — ACETAMINOPHEN 325 MG PO TABS
650.0000 mg | ORAL_TABLET | Freq: Four times a day (QID) | ORAL | Status: DC | PRN
  Administered 2021-08-07: 650 mg via ORAL
  Filled 2021-08-05: qty 2

## 2021-08-05 MED ORDER — APIXABAN 5 MG PO TABS
5.0000 mg | ORAL_TABLET | Freq: Two times a day (BID) | ORAL | Status: DC
  Administered 2021-08-05 – 2021-08-07 (×4): 5 mg via ORAL
  Filled 2021-08-05 (×4): qty 1

## 2021-08-05 MED ORDER — LACTATED RINGERS IV SOLN: INTRAVENOUS | Status: AC

## 2021-08-05 MED ORDER — FOLIC ACID 1 MG PO TABS
1.0000 mg | ORAL_TABLET | Freq: Every day | ORAL | Status: DC
  Administered 2021-08-06 – 2021-08-07 (×2): 1 mg via ORAL
  Filled 2021-08-05 (×2): qty 1

## 2021-08-05 MED ORDER — SODIUM CHLORIDE 0.9 % IV BOLUS
1000.0000 mL | Freq: Once | INTRAVENOUS | Status: AC
  Administered 2021-08-05: 1000 mL via INTRAVENOUS

## 2021-08-05 NOTE — H&P (Signed)
?History and Physical  ? ? ?Patient: [name] Potts MRN: [id number] DOA: 08/05/2021 ? ?Date of Service: the patient was seen and examined on 08/05/2021 ? ?Patient coming from: Home ? ?Chief Complaint:  ?Chief Complaint  ?Patient presents with  ? Nausea  ? Fatigue  ? ? ?HPI:  ? 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  Patient now presents to [facility] long hospital with complaints of nausea, inability to tolerate oral intake and generalized fatigue. ? ?Of note, patient underwent his first chemotherapy infusion on 4/19 which included Keytruda, Alimta and Carboplatin.   ? ?Patient states that shortly after the infusion he began to develop severe nausea.  This led to an inability to tolerate oral intake although the patient denies outright vomiting.  In the days that followed as the patient continued to exhibit extremely poor oral intake patient began to develop progressive worsening lethargy and generalized weakness, unable to do much else other than lying in bed.  Patient denies the ability to tolerate either solids or liquids. ? ?Upon further questioning patient denies fevers, sick contacts, recent travel, chest pain, shortness of breath, cough, dysuria.  Due to patient's persisting symptoms the patient eventually presented to [facility] emergency department for evaluation. ? ?Upon evaluation in the emergency department patient clinically was found to be volume depleted.  Initial work-up included a urinalysis that was relatively unremarkable as well as a chemistry that revealed evidence of a developing acute kidney injury with BUN of 32 and creatinine of 1.66.  Patient was given a 1 L bolus of normal saline.  Due to the patient's severe volume depletion, inability to tolerate oral intake and acute  kidney injury the hospitalist group has been called to assess the patient for admission to the hospital. ? ?Review of Systems: Review of Systems  ?Constitutional:  Positive for malaise/fatigue.  ?Gastrointestinal:  Positive for nausea.  ?Neurological:  Positive for weakness.  ?All other systems reviewed and are negative. ? ? ?Past Medical History:  ?Diagnosis Date  ? ADHD (attention deficit hyperactivity disorder)   ? Chronic back pain   ? Complication of anesthesia   ? woke up during 1 of 3 colonoscopies  ? Constipation   ? Dyspnea   ? Pneumonia   ? Seasonal allergies   ? ? ?Past Surgical History:  ?Procedure Laterality Date  ? COLONOSCOPY  10/11/2011  ? Procedure: COLONOSCOPY;  Surgeon: Daneil Dolin, MD;  [facility];  Service: Endoscopy;  Laterality: N/A;  1:45 PM  ? COLONOSCOPY N/A 01/23/2018  ? Procedure: COLONOSCOPY;  Surgeon: Daneil Dolin, MD;  Location: [facility];  Service: Endoscopy;  Laterality: N/A;  1:00  ? IR IMAGING GUIDED PORT INSERTION  07/27/2021  ? NECK SURGERY    ? Apr 05, 2021- per pt report cervical surgery at day surgery center  ? POLYPECTOMY  01/23/2018  ? Procedure: POLYPECTOMY;  Surgeon: Daneil Dolin, MD;  Location: [facility];  Service: Endoscopy;;  ? VIDEO ASSISTED THORACOSCOPY (VATS)/DECORTICATION Right 06/27/2021  ? Procedure: VIDEO ASSISTED THORACOSCOPY (VATS), Drainage of RIGHT pleural effusion/ DECORTICATION;  Surgeon: Melrose Nakayama, MD;  Location: [place];  Service: Thoracic;  Laterality: Right;  ? ? ?Social History:  reports that he quit smoking about 4 months ago. His smoking use included cigarettes. He smoked an  average of .5 packs per day. His smokeless tobacco use includes snuff. He reports that he does not currently use alcohol. He reports that he does not currently use drugs after having used the following drugs: Marijuana. ? ?No Known Allergies ? ?Family History  ?Problem Relation Age of Onset  ? Colon cancer Mother 63  ?     passed at age 51  ?  Stroke Father   ? ? ?Prior to Admission medications   ?Medication Sig Start Date End Date Taking? Authorizing Provider  ?ALPRAZolam (XANAX) 0.25 MG tablet Take 1 tablet (0.25 mg total) by mouth 2 (two) times daily as needed for anxiety. 07/06/21   Melrose Nakayama, MD  ?apixaban (ELIQUIS) 5 MG TABS tablet Take 2 tablets (10mg ) twice daily for 7 days, then 1 tablet (5mg ) twice daily 07/17/21   Melrose Nakayama, MD  ?atorvastatin (LIPITOR) 40 MG tablet Take 1 tablet (40 mg total) by mouth daily. 07/03/20   Noreene Larsson, NP  ?FeFum-FePoly-FA-B Cmp-C-Biot (INTEGRA PLUS) CAPS Take 1 capsule by mouth daily. 07/23/21   Curt Bears, MD  ?folic acid (FOLVITE) 1 MG tablet Take 1 tablet (1 mg total) by mouth daily. 07/23/21   Curt Bears, MD  ?lidocaine-prilocaine (EMLA) cream Apply to the Port-A-Cath site 30 minutes before chemotherapy 07/23/21   Curt Bears, MD  ?loratadine (CLARITIN) 10 MG tablet Take 10 mg by mouth every evening.    [provider]  ?mirtazapine (REMERON) 30 MG tablet Take 1 tablet (30 mg total) by mouth at bedtime. 07/23/21   Curt Bears, MD  ?morphine (MS CONTIN) 15 MG 12 hr tablet Take 2 tablets (30 mg total) by mouth every 12 (twelve) hours. 07/23/21   Curt Bears, MD  ?oxyCODONE (OXY IR/ROXICODONE) 5 MG immediate release tablet Take 1 tablet (5 mg total) by mouth every 6 (six) hours as needed for moderate pain or severe pain. 07/17/21   Melrose Nakayama, MD  ?prochlorperazine (COMPAZINE) 10 MG tablet Take 1 tablet (10 mg total) by mouth every 6 (six) hours as needed for nausea or vomiting. 07/23/21   Curt Bears, MD  ? ? ?Physical Exam: ? ?Vitals:  ? 08/05/21 1930 08/05/21 1953 08/05/21 1953 08/05/21 2030  ?BP: 109/82   125/84  ?Pulse: 89   88  ?Resp: 19   20  ?Temp:  98.3 ?F (36.8 ?C)  98.6 ?F (37 ?C)  ?TempSrc:  Oral  Oral  ?SpO2: 97%   100%  ?Weight:   78.9 kg   ?Height:   6' (1.829 m)   ? ? ?Constitutional: Lethargic but arousable, oriented x3 no  associated distress.   ?Skin: no rashes, no lesions, poor skin turgor noted. ?Eyes: Pupils are equally reactive to light.  No evidence of scleral icterus or conjunctival pallor.  ?ENMT: Dry mucous membranes noted.  Posterior pharynx clear of any exudate or lesions.   ?Neck: normal, supple, no masses, no thyromegaly.  No evidence of jugular venous distension.   ?Respiratory: Notable rales and diminished breath sounds in the right base.  No evidence of associated wheezing.  Normal respiratory effort. No accessory muscle use.  ?Cardiovascular: Regular rate and rhythm, no murmurs / rubs / gallops. No extremity edema. 2+ pedal pulses. No carotid bruits.  ?Chest:   Nontender without crepitus or deformity.   ?Back:   Nontender without crepitus or deformity. ?Abdomen: Abdomen is soft and nontender.  No evidence of intra-abdominal masses.  Positive bowel sounds noted in all quadrants.   ?Musculoskeletal: No joint  deformity upper and lower extremities. Good ROM, no contractures. Normal muscle tone.  ?Neurologic: CN 2-12 grossly intact. Sensation intact.  Patient moving all 4 extremities spontaneously.  Patient is following all commands.  Patient is responsive to verbal stimuli.   ?Psychiatric: Patient exhibits normal mood with appropriate affect.  Patient seems to possess insight as to their current situation.   ? ?Data Reviewed: ? ?I have personally reviewed and interpreted labs, imaging. ? ?Significant findings are sodium 133, BUN 32, creatinine 1.66, AST of 51, ALT 56, alkaline phosphatase 128.  CBC revealing white blood cell count 6.4, hemoglobin 10.7, hematocrit 33.9, platelet count of 147.  Urinalysis cloudy in appearance with 100 protein many bacteria and 6-10 white blood cells per high-powered field. ? ?Chest x-ray personally reviewed revealing moderate size persistent right hydropneumothorax.  This is essentially unchanged when compared to prior imaging on April 4.  No new acute infiltrates noted. ? ?EKG: Personally  reviewed.  Rhythm is normal sinus rhythm with heart rate of 88.  QTc of 450.  No dynamic ST segment changes appreciated. ? ? ? ?Assessment and Plan: ?* Acute kidney injury ([place]) ?Patient exhibiting evidence of

## 2021-08-05 NOTE — ED Triage Notes (Signed)
Patient c/o nausea, constipation, worsening fatigue x3 days. Had first chemo this week. Hx lung cancer. ?

## 2021-08-05 NOTE — Assessment & Plan Note (Addendum)
Diagnosed based on biopsies during VATS procedure in 3/15 revealing findings consistent with adenocarcinoma of the lung ?Status post first round of chemotherapy on 4/19 with Keytruda/Alimta and Carboplatin ?Follows with Dr. Julien Nordmann in the outpatient setting ?

## 2021-08-05 NOTE — Assessment & Plan Note (Addendum)
Recently diagnosed first week of April, likely related to ongoing malignancy ?Continue outpatient regimen of Eliquis twice daily ? ?

## 2021-08-05 NOTE — Assessment & Plan Note (Addendum)
Elevated LFT. ?Dehydration and hyponatremia ?Baseline serum creatinine 0.9.  On admission serum creatinine 1.66 with BUN 32. ?LFT also shows mild elevation. ?This is mostly dehydration and hyponatremia in the setting of recent chemotherapy. ?Continue to aggressively hydrate the patient with IV fluids and monitor. ?Avoid nephrotoxic medications.  We will perform further work-up of the renal function does not improve. ? ?

## 2021-08-05 NOTE — Assessment & Plan Note (Deleted)
?   Patient exhibiting mild hyponatremia with sodium of 133 ?? Considering clinical evidence of volume depletion this is likely hypovolemic ?? Providing patient with intravenous isotonic fluids ?? Monitoring sodium levels with serial chemistries ?

## 2021-08-05 NOTE — ED Provider Triage Note (Signed)
Emergency Medicine Provider Triage Evaluation Note ? ?[name] Potts , a 51 y.o. male  was evaluated in triage.  Pt complains of generalized weakness, constipation, and fatigue.  Patient does have stage IV lung cancer and had his first chemotherapy treatment on Wednesday.  Started feeling poorly over the last couple of days. ? ?Review of Systems  ?Positive:  ?Negative: See above  ? ?Physical Exam  ?BP 91/73   Pulse (!) 107   Temp 98 ?F (36.7 ?C)   Resp 18   SpO2 99%  ?Gen:   Awake, no distress   ?Resp:  Normal effort  ?MSK:   Moves extremities without difficulty  ?Other:  Pale ? ?Medical Decision Making  ?Medically screening exam initiated at 4:32 PM.  Appropriate orders placed.  DONTRE LADUCA was informed that the remainder of the evaluation will be completed by another provider, this initial triage assessment does not replace that evaluation, and the importance of remaining in the ED until their evaluation is complete. ? ? ?  ?Myna [place], PA-C ?08/05/21 1633 ? ?

## 2021-08-05 NOTE — Assessment & Plan Note (Addendum)
Chest x-ray reveals right-sided persisting hydropneumothorax ?Comparison with imaging earlier in the month reveals no change in size ?Patient denies any new shortness of breath and is not hypoxic ?No urgent intervention required for now. ?

## 2021-08-05 NOTE — ED Provider Notes (Signed)
?[facility] DEPT ?Provider Note ? ? ?CSN: [id number] ?Arrival date & time: 08/05/21  1604 ? ?  ? ?History ? ?Chief Complaint  ?Patient presents with  ? Nausea  ? Fatigue  ? ? ?[name] Potts is a 51 y.o. male. ? ?51 year old male with prior medical history as detailed below presents for evaluation.  Patient with history of lung cancer.  Patient reports that his first chemotherapy treatment was on this past Wednesday.  Patient developed nausea and significant malaise and fatigue roughly 48 hours later.  He denies fever.  Patient's wife reports that he has been lying in bed and not eating or drinking for the last 2 days. ? ?Patient complains of generalized fatigue and weakness.  He denies fever.  He denies pain.  He denies shortness of breath. ? ?The history is provided by the patient and medical records.  ?Illness ?Location:  Decreased p.o. intake, malaise, nausea, weakness ?Severity:  Moderate ?Onset quality:  Gradual ?Duration:  2 days ?Timing:  Constant ?Progression:  Worsening ?Chronicity:  New ? ?  ? ?Home Medications ?Prior to Admission medications   ?Medication Sig Start Date End Date Taking? Authorizing Provider  ?ALPRAZolam (XANAX) 0.25 MG tablet Take 1 tablet (0.25 mg total) by mouth 2 (two) times daily as needed for anxiety. 07/06/21   Melrose Nakayama, MD  ?apixaban (ELIQUIS) 5 MG TABS tablet Take 2 tablets (10mg ) twice daily for 7 days, then 1 tablet (5mg ) twice daily 07/17/21   Melrose Nakayama, MD  ?atorvastatin (LIPITOR) 40 MG tablet Take 1 tablet (40 mg total) by mouth daily. 07/03/20   Noreene Larsson, NP  ?FeFum-FePoly-FA-B Cmp-C-Biot (INTEGRA PLUS) CAPS Take 1 capsule by mouth daily. 07/23/21   Curt Bears, MD  ?folic acid (FOLVITE) 1 MG tablet Take 1 tablet (1 mg total) by mouth daily. 07/23/21   Curt Bears, MD  ?lidocaine-prilocaine (EMLA) cream Apply to the Port-A-Cath site 30 minutes before chemotherapy 07/23/21   Curt Bears, MD  ?loratadine  (CLARITIN) 10 MG tablet Take 10 mg by mouth every evening.    [provider]  ?mirtazapine (REMERON) 30 MG tablet Take 1 tablet (30 mg total) by mouth at bedtime. 07/23/21   Curt Bears, MD  ?morphine (MS CONTIN) 15 MG 12 hr tablet Take 2 tablets (30 mg total) by mouth every 12 (twelve) hours. 07/23/21   Curt Bears, MD  ?oxyCODONE (OXY IR/ROXICODONE) 5 MG immediate release tablet Take 1 tablet (5 mg total) by mouth every 6 (six) hours as needed for moderate pain or severe pain. 07/17/21   Melrose Nakayama, MD  ?prochlorperazine (COMPAZINE) 10 MG tablet Take 1 tablet (10 mg total) by mouth every 6 (six) hours as needed for nausea or vomiting. 07/23/21   Curt Bears, MD  ?   ? ?Allergies    ?Patient has no known allergies.   ? ?Review of Systems   ?Review of Systems  ?All other systems reviewed and are negative. ? ?Physical Exam ?Updated Vital Signs ?BP 106/72   Pulse 91   Temp 98 ?F (36.7 ?C)   Resp (!) 22   SpO2 97%  ?Physical Exam ?Vitals and nursing note reviewed.  ?Constitutional:   ?   General: He is not in acute distress. ?   Appearance: Normal appearance. He is well-developed.  ?HENT:  ?   Head: Normocephalic and atraumatic.  ?   Mouth/Throat:  ?   Mouth: Mucous membranes are dry.  ?Eyes:  ?   Conjunctiva/sclera: Conjunctivae  normal.  ?   Pupils: Pupils are equal, round, and reactive to light.  ?Cardiovascular:  ?   Rate and Rhythm: Normal rate and regular rhythm.  ?   Heart sounds: Normal heart sounds.  ?Pulmonary:  ?   Effort: Pulmonary effort is normal. No respiratory distress.  ?   Breath sounds: Normal breath sounds.  ?Abdominal:  ?   General: There is no distension.  ?   Palpations: Abdomen is soft.  ?   Tenderness: There is no abdominal tenderness.  ?Musculoskeletal:     ?   General: No deformity. Normal range of motion.  ?   Cervical back: Normal range of motion and neck supple.  ?Skin: ?   General: Skin is warm and dry.  ?Neurological:  ?   General: No focal deficit  present.  ?   Mental Status: He is alert and oriented to person, place, and time.  ? ? ?ED Results / Procedures / Treatments   ?Labs ?(all labs ordered are listed, but only abnormal results are displayed) ?Labs Reviewed  ?COMPREHENSIVE METABOLIC PANEL - Abnormal; Notable for the following components:  ?    Result Value  ? Sodium 133 (*)   ? Glucose, Bld 127 (*)   ? BUN 32 (*)   ? Creatinine, Ser 1.66 (*)   ? Albumin 3.4 (*)   ? AST 51 (*)   ? ALT 56 (*)   ? Alkaline Phosphatase 128 (*)   ? GFR, Estimated 49 (*)   ? All other components within normal limits  ?CBC WITH DIFFERENTIAL/PLATELET - Abnormal; Notable for the following components:  ? RBC 4.16 (*)   ? Hemoglobin 10.7 (*)   ? HCT 33.9 (*)   ? MCH 25.7 (*)   ? Platelets 147 (*)   ? All other components within normal limits  ?URINALYSIS, ROUTINE W REFLEX MICROSCOPIC - Abnormal; Notable for the following components:  ? APPearance CLOUDY (*)   ? Hgb urine dipstick SMALL (*)   ? Protein, ur 100 (*)   ? Bacteria, UA MANY (*)   ? All other components within normal limits  ? ? ?EKG ?None ? ?Radiology ?No results found. ? ?Procedures ?Procedures  ? ? ?Medications Ordered in ED ?Medications  ?sodium chloride 0.9 % bolus 1,000 mL (1,000 mLs Intravenous New Bag/Given 08/05/21 1812)  ?morphine (PF) 4 MG/ML injection 4 mg (4 mg Intravenous Given 08/05/21 1823)  ?ondansetron [facility]) injection 4 mg (4 mg Intravenous Given 08/05/21 1823)  ? ? ?ED Course/ Medical Decision Making/ A&P ?  ?                        ?Medical Decision Making ?Risk ?Prescription drug management. ?Decision regarding hospitalization. ? ? ? ?Medical Screen Complete ? ?This patient presented to the ED with complaint of weakness, fatigue, nausea. ? ?This complaint involves an extensive number of treatment options. The initial differential diagnosis includes, but is not limited to, dehydration, metabolic abnormality, etc. ? ?This presentation is: Acute, Chronic, Self-Limited, Previously Undiagnosed, Uncertain  Prognosis, Complicated, Systemic Symptoms, and Threat to Life/Bodily Function ? ?Patient is presenting after first chemo treatment on this past Wednesday. ? ?Probably 48 hours after treatment patient developed significant nausea, fatigue, weakness. ?Patient is now presenting for evaluation.  He appears dehydrated on exam. ? ?Labs demonstrate elevated BUN and creatinine above baseline. ? ?Patient would benefit from admission for IV fluids and continued treatment. ? ?Hospitalist service made aware of case and will  evaluate for same. ? ? ? ?Co morbidities that complicated the patient's evaluation ? ?Lung cancer ? ? ?Additional history obtained: ? ?Additional history obtained from Spouse ?External records from outside sources obtained and reviewed including prior ED visits and prior Inpatient records.  ? ? ?Lab Tests: ? ?I ordered and personally interpreted labs.  The pertinent results include: CBC, CMP, UA ? ? ?Cardiac Monitoring: ? ?The patient was maintained on a cardiac monitor.  I personally viewed and interpreted the cardiac monitor which showed an underlying rhythm of: NSR ? ? ?Medicines ordered: ? ?I ordered medication including IVF, Zofran, MSO4  for pain, nausea, dehydration ?Reevaluation of the patient after these medicines showed that the patient: stayed the same ? ? ? ?Problem List / ED Course: ? ?Dehydration, AKI ? ? ?Reevaluation: ? ?After the interventions noted above, I reevaluated the patient and found that they have: improved ? ?Disposition: ? ?After consideration of the diagnostic results and the patients response to treatment, I feel that the patent would benefit from admission.  ? ? ? ? ? ? ? ? ?Final Clinical Impression(s) / ED Diagnoses ?Final diagnoses:  ?Nausea  ?AKI (acute kidney injury) ([place])  ? ? ?Rx / DC Orders ?ED Discharge Orders   ? ? None  ? ?  ? ? ?  ?Valarie Merino, MD ?08/05/21 2356 ? ?

## 2021-08-05 NOTE — ED Notes (Signed)
Transported to X-ray

## 2021-08-05 NOTE — Assessment & Plan Note (Addendum)
On statin.  Currently on hold. ? ?

## 2021-08-06 ENCOUNTER — Other Ambulatory Visit: Payer: Commercial Managed Care - PPO

## 2021-08-06 ENCOUNTER — Ambulatory Visit: Payer: Commercial Managed Care - PPO

## 2021-08-06 ENCOUNTER — Observation Stay (HOSPITAL_COMMUNITY): Payer: Commercial Managed Care - PPO

## 2021-08-06 DIAGNOSIS — J948 Other specified pleural conditions: Secondary | ICD-10-CM | POA: Diagnosis present

## 2021-08-06 DIAGNOSIS — Z20822 Contact with and (suspected) exposure to covid-19: Secondary | ICD-10-CM | POA: Diagnosis present

## 2021-08-06 DIAGNOSIS — F909 Attention-deficit hyperactivity disorder, unspecified type: Secondary | ICD-10-CM | POA: Diagnosis present

## 2021-08-06 DIAGNOSIS — R110 Nausea: Principal | ICD-10-CM

## 2021-08-06 DIAGNOSIS — G8929 Other chronic pain: Secondary | ICD-10-CM | POA: Diagnosis present

## 2021-08-06 DIAGNOSIS — Z86718 Personal history of other venous thrombosis and embolism: Secondary | ICD-10-CM | POA: Diagnosis not present

## 2021-08-06 DIAGNOSIS — M545 Low back pain, unspecified: Secondary | ICD-10-CM | POA: Diagnosis present

## 2021-08-06 DIAGNOSIS — Z79899 Other long term (current) drug therapy: Secondary | ICD-10-CM | POA: Diagnosis not present

## 2021-08-06 DIAGNOSIS — Z7901 Long term (current) use of anticoagulants: Secondary | ICD-10-CM | POA: Diagnosis not present

## 2021-08-06 DIAGNOSIS — C3491 Malignant neoplasm of unspecified part of right bronchus or lung: Secondary | ICD-10-CM | POA: Diagnosis present

## 2021-08-06 DIAGNOSIS — D61810 Antineoplastic chemotherapy induced pancytopenia: Secondary | ICD-10-CM | POA: Diagnosis present

## 2021-08-06 DIAGNOSIS — E782 Mixed hyperlipidemia: Secondary | ICD-10-CM | POA: Diagnosis present

## 2021-08-06 DIAGNOSIS — Z79891 Long term (current) use of opiate analgesic: Secondary | ICD-10-CM | POA: Diagnosis not present

## 2021-08-06 DIAGNOSIS — T451X5A Adverse effect of antineoplastic and immunosuppressive drugs, initial encounter: Secondary | ICD-10-CM | POA: Diagnosis present

## 2021-08-06 DIAGNOSIS — K5900 Constipation, unspecified: Secondary | ICD-10-CM | POA: Diagnosis present

## 2021-08-06 DIAGNOSIS — Z95828 Presence of other vascular implants and grafts: Secondary | ICD-10-CM | POA: Insufficient documentation

## 2021-08-06 DIAGNOSIS — E871 Hypo-osmolality and hyponatremia: Secondary | ICD-10-CM | POA: Diagnosis present

## 2021-08-06 DIAGNOSIS — N179 Acute kidney failure, unspecified: Secondary | ICD-10-CM | POA: Diagnosis present

## 2021-08-06 DIAGNOSIS — E860 Dehydration: Secondary | ICD-10-CM | POA: Diagnosis present

## 2021-08-06 LAB — COMPREHENSIVE METABOLIC PANEL
ALT: 58 U/L — ABNORMAL HIGH (ref 0–44)
AST: 49 U/L — ABNORMAL HIGH (ref 15–41)
Albumin: 3.1 g/dL — ABNORMAL LOW (ref 3.5–5.0)
Alkaline Phosphatase: 109 U/L (ref 38–126)
Anion gap: 11 (ref 5–15)
BUN: 28 mg/dL — ABNORMAL HIGH (ref 6–20)
CO2: 26 mmol/L (ref 22–32)
Calcium: 9 mg/dL (ref 8.9–10.3)
Chloride: 100 mmol/L (ref 98–111)
Creatinine, Ser: 1.46 mg/dL — ABNORMAL HIGH (ref 0.61–1.24)
GFR, Estimated: 58 mL/min — ABNORMAL LOW (ref >60)
Glucose, Bld: 90 mg/dL (ref 70–99)
Potassium: 4.4 mmol/L (ref 3.5–5.1)
Sodium: 137 mmol/L (ref 135–145)
Total Bilirubin: 0.6 mg/dL (ref 0.3–1.2)
Total Protein: 6.8 g/dL (ref 6.5–8.1)

## 2021-08-06 LAB — LACTIC ACID, PLASMA: Lactic Acid, Venous: 1.7 mmol/L (ref 0.5–1.9)

## 2021-08-06 MED ORDER — LACTATED RINGERS IV SOLN: INTRAVENOUS | Status: DC

## 2021-08-06 MED ORDER — SENNOSIDES-DOCUSATE SODIUM 8.6-50 MG PO TABS
2.0000 | ORAL_TABLET | Freq: Two times a day (BID) | ORAL | Status: DC
  Administered 2021-08-06 – 2021-08-07 (×3): 2 via ORAL
  Filled 2021-08-06 (×3): qty 2

## 2021-08-06 MED ORDER — CHLORHEXIDINE GLUCONATE CLOTH 2 % EX PADS
6.0000 | MEDICATED_PAD | Freq: Every day | CUTANEOUS | Status: DC
  Administered 2021-08-07: 6 via TOPICAL

## 2021-08-06 MED ORDER — LIP MEDEX EX OINT
TOPICAL_OINTMENT | CUTANEOUS | Status: DC | PRN
  Filled 2021-08-06: qty 7

## 2021-08-06 MED ORDER — SODIUM CHLORIDE 0.9% FLUSH: 10.0000 mL | INTRAVENOUS | Status: DC | PRN

## 2021-08-06 NOTE — Assessment & Plan Note (Addendum)
Patient received first session of chemotherapy on 4/19. ?Platelet and hemoglobin mildly on the lower side. ?WBC trending down as well. ?We will recheck tomorrow. ?Appreciate oncology assistance.  Does not appear to have received any outpatient Hematopoietic Growth Factors ?Continue Eliquis as long as platelet count does not drop below 50,000. ? ?

## 2021-08-06 NOTE — Assessment & Plan Note (Signed)
Most likely secondary to chemotherapy. ?Continue current regimen. ?Medical oncology aware of patient's presentation with nausea. ?

## 2021-08-06 NOTE — Progress Notes (Addendum)
HEMATOLOGY-ONCOLOGY PROGRESS NOTE ? ?ASSESSMENT AND PLAN: ?This is a 51 year old male with recently diagnosed stage IV (T1b, N2, M1a) non-small cell lung cancer, adenocarcinoma.  He presented with a right lower lobe nodule in addition to right paratracheal and subcarinal lymphadenopathy and a malignant right pleural effusion as well as pleural metastatic disease diagnosed in March 2023.  Molecular studies showed no actionable mutation and PD-L1 expression was 1%.  His molecular studies also showed  KRAS G12D, Myc amplification, CCND3 amplification, TP53 R248L, VEGFA amplification but these are not actionable at this time. ? ?He was started on systemic chemotherapy with carboplatin for an AUC of 5, Alimta 500 mg per metered squared, and Keytruda 200 mg IV every 3 weeks.  He is status post 1 cycle of chemotherapy.  His first cycle was given on 08/01/2021.  He developed severe fatigue, nausea, and anorexia prompting evaluation in the emergency department.  He was found to have AKI and dehydration.  He has been started on IV fluids and as needed antiemetics.  He is feeling somewhat better today.  Still quite fatigued and having mild nausea.  However, he is able to eat today.  His renal function has improved and his hyponatremia has resolved.  He reports constipation today. ? ?Recommend for him to continue IV fluids and to continue diet as tolerated.  Recheck renal function in the a.m.  Continue as needed antiemetics.  If nausea worsens, I would consider adding lorazepam. ? ?He was scheduled for follow-up for toxicity check in our office tomorrow.  I have canceled these appointments.  Recommend rechecking CBC with differential in the a.m. since he received chemotherapy recently.  I anticipate at least a mild drop in his blood counts due to recent chemo.  It is okay from our perspective to continue his Eliquis as long as his platelet count does not drop below 50,000. ? ?For his constipation, I have ordered Senokot S 2  tablets twice a day. ? ?Mikey Bussing, DNP, AGPCNP-BC, AOCNP ? ?The total time spent in the visit was 30 minutes. Greater than 50%  of this time was spent counseling and coordinating care related to the above assessment and plan.  ? ?SUBJECTIVE: [name] Potts is followed by our office for stage IV non-small cell lung cancer, adenocarcinoma.  He recently started systemic chemotherapy with carboplatin for an AUC of 5, Alimta 500 mg per metered squared, and Keytruda 200 mg on 08/01/2021.  Several days after the infusion, he developed worsening fatigue and nausea.  He was unable to take in much by mouth.  He was admitted to the hospital with AKI, dehydration, hyponatremia.  He has been started on IV fluids. ? ?Today, he reports that he feels somewhat better.  He still has some mild nausea.  However, he was able to eat part of a biscuit this morning and did not develop any vomiting.  He reports ongoing constipation.  No bowel movement in about 2 days.  Typically takes Senokot at home.  He is not having any fevers or chills.  No shortness of breath reported.  No bleeding reported.  He had an abdominal x-ray performed earlier today which showed a nonobstructive bowel gas pattern and no acute changes in the abdomen.  His loculated right hydropneumothorax was also seen but unchanged from prior chest x-ray.  Currently asymptomatic from this. ? ?Oncology History  ?Adenocarcinoma of right lung, stage 4 ([place])  ?07/23/2021 Initial Diagnosis  ? Adenocarcinoma of right lung, stage 4 ([place]) ? ?  ?07/23/2021 Cancer  Staging  ? Staging form: Lung, AJCC 8th Edition ?- Clinical: Stage IVA (cT1b, cN2, cM1a) - Signed by Curt Bears, MD on 07/23/2021 ? ?  ?08/01/2021 -  Chemotherapy  ? Patient is on Treatment Plan : LUNG Carboplatin (5) + Pemetrexed (500) + Pembrolizumab (200) D1 q21d Induction x 4 cycles / Maintenance Pemetrexed (500) + Pembrolizumab (200) D1 q21d  ? ?  ?  ? ? ? ?REVIEW OF SYSTEMS:   ?Review of Systems  ?Constitutional:   Positive for malaise/fatigue. Negative for chills and fever.  ?HENT: Negative.    ?Eyes: Negative.   ?Respiratory: Negative.    ?Cardiovascular: Negative.   ?Gastrointestinal:  Positive for constipation and nausea. Negative for abdominal pain and vomiting.  ?Skin: Negative.   ?Neurological: Negative.   ?Psychiatric/Behavioral: Negative.    ? ?I have reviewed the past medical history, past surgical history, social history and family history with the patient and they are unchanged from previous note. ? ? ?PHYSICAL EXAMINATION: ?ECOG PERFORMANCE STATUS: 2 - Symptomatic, <50% confined to bed ? ?Vitals:  ? 08/06/21 0539 08/06/21 0839  ?BP: 114/73 113/75  ?Pulse: 79 96  ?Resp: 20 18  ?Temp: 98.5 ?F (36.9 ?C) 98.8 ?F (37.1 ?C)  ?SpO2: 92% 96%  ? ?Filed Weights  ? 08/05/21 1953  ?Weight: 78.9 kg  ? ? ?Intake/Output from previous day: ?04/23 0701 - 04/24 0700 ?In: 1063.9 [P.O.:360; I.V.:703.9] ?Out: -  ? ?Physical Exam ?Vitals reviewed.  ?Constitutional:   ?   General: He is not in acute distress. ?HENT:  ?   Head: Normocephalic.  ?Eyes:  ?   General: No scleral icterus. ?   Conjunctiva/sclera: Conjunctivae normal.  ?Skin: ?   General: Skin is warm and dry.  ?Neurological:  ?   Mental Status: He is alert and oriented to person, place, and time.  ? ? ?LABORATORY DATA:  ?I have reviewed the data as listed ? ?  Latest Ref Rng & Units 08/06/2021  ?  7:44 AM 08/05/2021  ?  4:55 PM 08/01/2021  ? 12:40 PM  ?CMP  ?Glucose 70 - 99 mg/dL 90   127   186    ?BUN 6 - 20 mg/dL 28   32   12    ?Creatinine 0.61 - 1.24 mg/dL 1.46   1.66   0.98    ?Sodium 135 - 145 mmol/L 137   133   136    ?Potassium 3.5 - 5.1 mmol/L 4.4   4.2   3.8    ?Chloride 98 - 111 mmol/L 100   99   100    ?CO2 22 - 32 mmol/L _0 ?Calcium 8.9 - 10.3 mg/dL 9.0   9.1   9.1    ?Total Protein 6.5 - 8.1 g/dL 6.8   7.6   7.5    ?Total Bilirubin 0.3 - 1.2 mg/dL 0.6   0.8   0.4    ?Alkaline Phos 38 - 126 U/L 109   128   146    ?AST 15 - 41 U/L 49   51   17    ?ALT 0 -  44 U/L 58   56   15    ? ? ?Lab Results  ?Component Value Date  ? WBC 6.4 08/05/2021  ? HGB 10.7 (L) 08/05/2021  ? HCT 33.9 (L) 08/05/2021  ? MCV 81.5 08/05/2021  ? PLT 147 (L) 08/05/2021  ? NEUTROABS 5.4 08/05/2021  ? ? ?Lab Results  ?  Component Value Date  ? PSA1 1.1 11/22/2020  ? ? ?DG Chest 2 View ? ?Result Date: 08/05/2021 ?CLINICAL DATA:  Worsening fatigue post first chemo treatment for lung cancer. EXAM: CHEST - 2 VIEW COMPARISON:  July 17, 2021 FINDINGS: Check tubal port terminates in the distal superior vena cava. Cardiomediastinal silhouette is normal. Mediastinal contours appear intact. Persistent right hydropneumothorax.  The left lung is clear. Osseous structures are without acute abnormality. Soft tissues are grossly normal. IMPRESSION: Persistent right hydropneumothorax, similar in size when compared to July 17, 2021. Electronically Signed   By: Fidela Salisbury M.D.   On: 08/05/2021 20:18  ? ?DG Chest 2 View ? ?Result Date: 07/17/2021 ?CLINICAL DATA:  Lung cancer status post VATS on 06/27/2021 EXAM: CHEST - 2 VIEW COMPARISON:  07/10/2021 FINDINGS: The heart size and mediastinal contours are within normal limits. Similar size of small right pleural effusion with suspected residual small locules of air at its basilar component. Improving aeration of the right lung base with linear scarring/atelectasis centrally. Left lung remains clear. Resolved subcutaneous emphysema. IMPRESSION: Similar size of small right pleural effusion with suspected residual small locules of air at its basilar component. Improving aeration of the right lung base with linear scarring/atelectasis centrally. Electronically Signed   By: Davina Poke D.O.   On: 07/17/2021 12:55  ? ?DG Chest 2 View ? ?Result Date: 07/10/2021 ?CLINICAL DATA:  Pleural effusion status post chest tube removal EXAM: CHEST - 2 VIEW COMPARISON:  Chest x-ray dated July 10, 2021 FINDINGS: Visualized cardiac and mediastinal contours are unchanged. Small  right pleural effusion with associated small locules air, compatible with hydropneumothorax, unchanged in size when compared with prior exam. Interval removal of right-sided chest tube. Soft tissue gas seen along

## 2021-08-06 NOTE — Progress Notes (Signed)
?  Progress Note ?Patient: [name] Potts FXJ:[id number] DOB: [birth year] DOA: 08/05/2021  ?DOS: the patient was seen and examined on 08/06/2021 ? ?Brief hospital course: ?51 year old male with past medical history of hyperlipidemia, ADHD, chronic low back pain, right lower extremity DVT (dx 07/17/2021 currently on Eliquis) as well as recent diagnosis of adenocarcinoma of the right lung 06/2021, received his first chemotherapy infusion on 4/19 which included Keytruda, Alimta and Carboplatin. ?Presented with nausea and fatigue.  Found to have AKI. ? ?Assessment and Plan: ?* Acute kidney injury ([place]) ?Elevated LFT. ?Dehydration and hyponatremia ?Baseline serum creatinine 0.9.  On admission serum creatinine 1.66 with BUN 32. ?LFT also shows mild elevation. ?This is mostly dehydration and hyponatremia in the setting of recent chemotherapy. ?Continue to aggressively hydrate the patient with IV fluids and monitor. ?Avoid nephrotoxic medications.  We will perform further work-up of the renal function does not improve. ? ? ?Nausea ?Most likely secondary to chemotherapy. ?Continue current regimen. ?Medical oncology aware of patient's presentation with nausea. ? ?Hydropneumothorax ?Chest x-ray reveals right-sided persisting hydropneumothorax ?Comparison with imaging earlier in the month reveals no change in size ?Patient denies any new shortness of breath and is not hypoxic ?No urgent intervention required for now. ? ?Acute deep vein thrombosis (DVT) of right lower extremity ([place]) ?Recently diagnosed first week of April, likely related to ongoing malignancy ?Continue outpatient regimen of Eliquis twice daily ? ? ?Adenocarcinoma of right lung, stage 4 ([place]) ?Diagnosed based on biopsies during VATS procedure in 3/15 revealing findings consistent with adenocarcinoma of the lung ?Status post first round of chemotherapy on 4/19 with Keytruda/Alimta and Carboplatin ?Follows with Dr. Julien Nordmann in the outpatient setting ? ?Mixed  hyperlipidemia ?On statin.  Currently on hold. ? ? ?Antineoplastic chemotherapy induced pancytopenia (CODE) ([facility]) ?Patient received first session of chemotherapy on 4/19. ?Platelet and hemoglobin mildly on the lower side. ?WBC trending down as well. ?We will recheck tomorrow. ?Appreciate oncology assistance.  Does not appear to have received any outpatient Hematopoietic Growth Factors ?Continue Eliquis as long as platelet count does not drop below 50,000. ? ? ?Subjective: Reports some nausea.  Also has some constipation.  No fever no chills. ? ?Physical Exam: ?Vitals:  ? 08/05/21 2030 08/06/21 0539 08/06/21 0839 08/06/21 1335  ?BP: 125/84 114/73 113/75 112/83  ?Pulse: 88 79 96 88  ?Resp: 20 20 18 16   ?Temp: 98.6 ?F (37 ?C) 98.5 ?F (36.9 ?C) 98.8 ?F (37.1 ?C) 98.3 ?F (36.8 ?C)  ?TempSrc: Oral Oral Oral Oral  ?SpO2: 100% 92% 96% 97%  ?Weight:      ?Height:      ? ?General: Appear in mild distress; no visible Abnormal Neck Mass Or lumps, Conjunctiva normal ?Cardiovascular: S1 and S2 Present, no Murmur, ?Respiratory: good respiratory effort, Bilateral Air entry present and CTA, no Crackles, no wheezes ?Abdomen: Bowel Sound present, Non tender ?Extremities: no Pedal edema ?Neurology: alert and oriented to time, place, and person ?Gait not checked due to patient safety concerns  ? ?Data Reviewed: ?I have Reviewed nursing notes, Vitals, and Lab results since pt's last encounter. Pertinent lab results CBC and CMP ?I have ordered test including CBC and BMP ?I have discussed pt's care plan and test results with oncology.  ? ?Family Communication: Wife at bedside ? ?Disposition: ?Status is: Inpatient ?Remains inpatient appropriate because: Needing IV fluids for AKI due to poor p.o. intake and nausea ? ?Author: ?Berle Mull, MD ?08/06/2021 6:57 PM ? ?Please look on [email or web address] to find out who is on call. ?

## 2021-08-06 NOTE — Plan of Care (Signed)

## 2021-08-06 NOTE — Hospital Course (Signed)
51 year old male with past medical history of hyperlipidemia, ADHD, chronic low back pain, right lower extremity DVT (dx 07/17/2021 currently on Eliquis) as well as recent diagnosis of adenocarcinoma of the right lung 06/2021, received his first chemotherapy infusion on 4/19 which included Keytruda, Alimta and Carboplatin. ?Presented with nausea and fatigue.  Found to have AKI. ?

## 2021-08-06 NOTE — Progress Notes (Signed)
?  Transition of Care (TOC) Screening Note ? ? ?Patient Details  ?Name: [name] Potts ?Date of Birth: 06/29/1970 ? ? ?Transition of Care [facility]) CM/SW Contact:    ?Dessa Phi, RN ?Phone Number: ?08/06/2021, 11:53 AM ? ? ? ?Transition of Care Department [facility]) has reviewed patient and no TOC needs have been identified at this time. We will continue to monitor patient advancement through interdisciplinary progression rounds. If new patient transition needs arise, please place a TOC consult. ?  ?

## 2021-08-07 ENCOUNTER — Ambulatory Visit: Payer: Commercial Managed Care - PPO | Admitting: Internal Medicine

## 2021-08-07 ENCOUNTER — Inpatient Hospital Stay: Payer: Commercial Managed Care - PPO

## 2021-08-07 ENCOUNTER — Encounter: Payer: Self-pay | Admitting: Internal Medicine

## 2021-08-07 ENCOUNTER — Inpatient Hospital Stay: Payer: Commercial Managed Care - PPO | Admitting: Internal Medicine

## 2021-08-07 ENCOUNTER — Other Ambulatory Visit: Payer: Commercial Managed Care - PPO

## 2021-08-07 DIAGNOSIS — N179 Acute kidney failure, unspecified: Secondary | ICD-10-CM | POA: Diagnosis not present

## 2021-08-07 LAB — COMPREHENSIVE METABOLIC PANEL
ALT: 56 U/L — ABNORMAL HIGH (ref 0–44)
AST: 45 U/L — ABNORMAL HIGH (ref 15–41)
Albumin: 2.9 g/dL — ABNORMAL LOW (ref 3.5–5.0)
Alkaline Phosphatase: 102 U/L (ref 38–126)
Anion gap: 10 (ref 5–15)
BUN: 25 mg/dL — ABNORMAL HIGH (ref 6–20)
CO2: 25 mmol/L (ref 22–32)
Calcium: 8.4 mg/dL — ABNORMAL LOW (ref 8.9–10.3)
Chloride: 98 mmol/L (ref 98–111)
Creatinine, Ser: 1.36 mg/dL — ABNORMAL HIGH (ref 0.61–1.24)
GFR, Estimated: >60 mL/min (ref >60)
Glucose, Bld: 116 mg/dL — ABNORMAL HIGH (ref 70–99)
Potassium: 3.6 mmol/L (ref 3.5–5.1)
Sodium: 133 mmol/L — ABNORMAL LOW (ref 135–145)
Total Bilirubin: 0.6 mg/dL (ref 0.3–1.2)
Total Protein: 6.4 g/dL — ABNORMAL LOW (ref 6.5–8.1)

## 2021-08-07 LAB — CBC
HCT: 27.4 % — ABNORMAL LOW (ref 39.0–52.0)
Hemoglobin: 8.8 g/dL — ABNORMAL LOW (ref 13.0–17.0)
MCH: 26 pg (ref 26.0–34.0)
MCHC: 32.1 g/dL (ref 30.0–36.0)
MCV: 80.8 fL (ref 80.0–100.0)
Platelets: 75 10*3/uL — ABNORMAL LOW (ref 150–400)
RBC: 3.39 MIL/uL — ABNORMAL LOW (ref 4.22–5.81)
RDW: 14.8 % (ref 11.5–15.5)
WBC: 6.4 10*3/uL (ref 4.0–10.5)
nRBC: 0 % (ref 0.0–0.2)

## 2021-08-07 LAB — HIV ANTIBODY (ROUTINE TESTING W REFLEX): HIV Screen 4th Generation wRfx: NONREACTIVE

## 2021-08-07 LAB — MAGNESIUM: Magnesium: 1.7 mg/dL (ref 1.7–2.4)

## 2021-08-07 MED ORDER — HEPARIN SOD (PORK) LOCK FLUSH 100 UNIT/ML IV SOLN
500.0000 [IU] | INTRAVENOUS | Status: AC | PRN
  Administered 2021-08-07: 500 [IU]
  Filled 2021-08-07: qty 5

## 2021-08-07 MED ORDER — POLYETHYLENE GLYCOL 3350 17 G PO PACK: 17.0000 g | PACK | Freq: Every day | ORAL | 0 refills | Status: AC | PRN

## 2021-08-07 NOTE — Progress Notes (Signed)
Initial Nutrition Assessment ? ?DOCUMENTATION CODES:  ? ?Not applicable ? ?INTERVENTION:  ?- will communicate with RDs at the [facility]. ? ? ?NUTRITION DIAGNOSIS:  ? ?Increased nutrient needs related to acute illness, catabolic illness, cancer and cancer related treatments as evidenced by estimated needs. ? ?GOAL:  ? ?Patient will meet greater than or equal to 90% of their needs ? ?MONITOR:  ? ?PO intake, Labs, Weight trends ? ?REASON FOR ASSESSMENT:  ? ?Malnutrition Screening Tool ? ?ASSESSMENT:  ? ?51 year old male with medical history of HLD, ADHD, chronic low back pain, RLE DVT (dx 07/17/2021 currently on Eliquis), and dx of adenocarcinoma of the R lung 06/2021, received his first chemotherapy infusion on 4/19 which included Keytruda, Alimta and Carboplatin. He presented to the ED due to nausea and fatigue and was admitted due to AKI. ? ?Per chart review, patient ate 25% of dinner last night and 100% of breakfast and lunch today. Patient sitting up in bed and wife at bedside. Patient shares he was in his usual state of health pertaining to activity and appetite and PO intakes until the few days after first chemo infusion on 4/19. Patient has L chest port.  ? ?Patient shares that in the days after chemo he was very lethargic, sleeping more, feeling nauseated, experiencing constipation, and appetite and PO intakes were significantly decreased. ? ?We discussed making a plan for the days following subsequent chemo infusions, focusing on fluid intake and having liquids and fruits he enjoys easily accessible. He shares that appetite has now returned to baseline and he is hopeful to go home soon. ? ?Weight on 4/23 was 174 lb and weight on 4/10 was 179.5 lb. This indicates 5.5 lb weight loss (3% body weight) in the past 2 weeks; of note, patient was dehydrated at the time of being weighed on 4/23. ? ? ?Labs reviewed; Na: 133 mmol/l, BUN: 25 mg/dl, creatinine: 1.36 mg/dl, Ca: 8.4 mg/dl. ?Medications reviewed; 1 mg  folvite/day, 2 tablets senokot BID. ?IVF; LR @ 100 ml/hr. ?  ? ?NUTRITION - FOCUSED PHYSICAL EXAM: ? ?Flowsheet Row Most Recent Value  ?Orbital Region No depletion  ?Upper Arm Region Mild depletion  ?Thoracic and Lumbar Region Unable to assess  ?Buccal Region No depletion  ?Temple Region No depletion  ?Clavicle Bone Region No depletion  ?Clavicle and Acromion Bone Region Mild depletion  ?Scapular Bone Region No depletion  ?Dorsal Hand No depletion  ?Patellar Region No depletion  ?Anterior Thigh Region No depletion  ?Posterior Calf Region No depletion  ?Edema (RD Assessment) None  ?Hair Reviewed  ?Eyes Reviewed  ?Mouth Reviewed  ?Skin Reviewed  ?Nails Reviewed  ? ?  ? ? ?Diet Order:   ?Diet Order   ? ?       ?  Diet regular Room service appropriate? Yes; Fluid consistency: Thin  Diet effective now       ?  ? ?  ?  ? ?  ? ? ?EDUCATION NEEDS:  ? ?Education needs have been addressed ? ?Skin:  Skin Assessment: Reviewed RN Assessment ? ?Last BM:  PTA/unknown ? ?Height:  ? ?Ht Readings from Last 1 Encounters:  ?08/05/21 6' (1.829 m)  ? ? ?Weight:  ? ?Wt Readings from Last 1 Encounters:  ?08/05/21 78.9 kg  ? ? ? ?BMI:  Body mass index is 23.6 kg/m?. ? ?Estimated Nutritional Needs:  ?Kcal:  2200-2400 kcal ?Protein:  110-120 grams ?Fluid:  >/= 2.4 L/day ? ? ? ? ?Jarome Matin, MS, RD, LDN ?Registered Dietitian II ?  Inpatient Clinical Nutrition ?RD pager # and on-call/weekend pager # available in [place]  ? ?

## 2021-08-07 NOTE — Progress Notes (Signed)
Discharge package printed and instructions given to patient. Patient verbalizes understanding. 

## 2021-08-07 NOTE — Discharge Summary (Signed)
?Physician Discharge Summary ?  ?Patient: [name] Potts MRN: [id number] DOB: May 26, 1970  ?Admit date:     08/05/2021  ?Discharge date: 08/07/2021  ?Discharge Physician: Berle Mull  ?PCP: [name], MD ? ?Recommendations at discharge: ? Follow up with Oncology with repeat CBC and BMP ? ?Discharge Diagnoses: ?Principal Problem: ?  Acute kidney injury ([place]) ?Active Problems: ?  Nausea ?  Dehydration with hyponatremia ?  Hydropneumothorax ?  Acute deep vein thrombosis (DVT) of right lower extremity ([place]) ?  Adenocarcinoma of right lung, stage 4 ([place]) ?  Mixed hyperlipidemia ?  Antineoplastic chemotherapy induced pancytopenia (CODE) (HCC) ? ?Hospital Course: ?51 year old male with past medical history of hyperlipidemia, ADHD, chronic low back pain, right lower extremity DVT (dx 07/17/2021 currently on Eliquis) as well as recent diagnosis of adenocarcinoma of the right lung 06/2021, received his first chemotherapy infusion on 4/19 which included Keytruda, Alimta and Carboplatin. ?Presented with nausea and fatigue.  Found to have AKI. ? ?Assessment and Plan: ?* Acute kidney injury ([place]) ?Elevated LFT. ?Dehydration and hyponatremia ?Baseline serum creatinine 0.9.  On admission serum creatinine 1.66 with BUN 32. ?LFT also shows mild elevation. ?This is mostly dehydration and hyponatremia in the setting of recent chemotherapy. ?Treated aggressively with IV fluids ? ?Nausea ?Most likely secondary to chemotherapy. ?Continue current regimen. ?Medical oncology aware of patient's presentation with nausea. ? ?Hydropneumothorax ?Chest x-ray reveals right-sided persisting hydropneumothorax ?Comparison with imaging earlier in the month reveals no change in size ?Patient denies any new shortness of breath and is not hypoxic ?No urgent intervention required for now. ? ?Acute deep vein thrombosis (DVT) of right lower extremity ([place]) ?Recently diagnosed first week of April, likely related to ongoing malignancy ?Continue outpatient regimen of  Eliquis twice daily ? ?Adenocarcinoma of right lung, stage 4 ([place]) ?Diagnosed based on biopsies during VATS procedure in 3/15 revealing findings consistent with adenocarcinoma of the lung ?Status post first round of chemotherapy on 4/19 with Keytruda/Alimta and Carboplatin ?Follows with Dr. Julien Nordmann in the outpatient setting ? ?Mixed hyperlipidemia ?On statin ? ?Antineoplastic chemotherapy induced pancytopenia (CODE) (HCC) ?Patient received first session of chemotherapy on 4/19. ?Platelet and hemoglobin mildly on the lower side. ?WBC trending down as well. ?Appreciate oncology assistance.  Does not appear to have received any outpatient Hematopoietic Growth Factors ?Continue Eliquis as long as platelet count does not drop below 50,000. ?Outpt follow up in 1 week ? ?Consultants: Oncology  ?Procedures performed:  ?none ?DISCHARGE MEDICATION: ?Allergies as of 08/07/2021   ?No Known Allergies ?  ? ?  ?Medication List  ?  ? ?TAKE these medications   ? ?ALPRAZolam 0.25 MG tablet ?Commonly known as: Xanax ?Take 1 tablet (0.25 mg total) by mouth 2 (two) times daily as needed for anxiety. ?  ?apixaban 5 MG Tabs tablet ?Commonly known as: Eliquis ?Take 2 tablets (10mg ) twice daily for 7 days, then 1 tablet (5mg ) twice daily ?What changed:  ?how much to take ?how to take this ?when to take this ?additional instructions ?  ?atorvastatin 40 MG tablet ?Commonly known as: LIPITOR ?Take 1 tablet (40 mg total) by mouth daily. ?  ?folic acid 1 MG tablet ?Commonly known as: FOLVITE ?Take 1 tablet (1 mg total) by mouth daily. ?  ?Integra Plus Caps ?Take 1 capsule by mouth daily. ?  ?lidocaine-prilocaine cream ?Commonly known as: EMLA ?Apply to the Port-A-Cath site 30 minutes before chemotherapy ?  ?mirtazapine 30 MG tablet ?Commonly known as: Remeron ?Take 1 tablet (30 mg total) by mouth at  bedtime. ?  ?morphine 15 MG 12 hr tablet ?Commonly known as: MS CONTIN ?Take 2 tablets (30 mg total) by mouth every 12 (twelve) hours. ?What changed:  how much to take ?  ?oxyCODONE 5 MG immediate release tablet ?Commonly known as: Oxy IR/ROXICODONE ?Take 1 tablet (5 mg total) by mouth every 6 (six) hours as needed for moderate pain or severe pain. ?  ?polyethylene glycol 17 g packet ?Commonly known as: MIRALAX / GLYCOLAX ?Take 17 g by mouth daily as needed for mild constipation. ?  ?prochlorperazine 10 MG tablet ?Commonly known as: COMPAZINE ?Take 1 tablet (10 mg total) by mouth every 6 (six) hours as needed for nausea or vomiting. ?  ? ?  ? ? Follow-up Information   ? ? [name], MD. Schedule an appointment as soon as possible for a visit in 1 week(s).   ?Specialty: Internal Medicine ?Contact information: ?[address] ?[place] [address] ?[phone] ? ? ?  ?  ? ?  ?  ? ?  ? ?Disposition: Home ?Diet recommendation: Regular diet ? ?Discharge Exam: ?Vitals:  ? 08/06/21 1335 08/06/21 2019 08/07/21 0621 08/07/21 1221  ?BP: 112/83 114/80 123/79 118/67  ?Pulse: 88 91 (!) 105 97  ?Resp: 16 18 18 20   ?Temp: 98.3 ?F (36.8 ?C) 99.2 ?F (37.3 ?C) 98 ?F (36.7 ?C) 98.6 ?F (37 ?C)  ?TempSrc: Oral Oral Oral Oral  ?SpO2: 97% 97% 97% 100%  ?Weight:      ?Height:      ? ?General: Appear in no distress; no visible Abnormal Neck Mass Or lumps, Conjunctiva normal ?Cardiovascular: S1 and S2 Present, no Murmur, ?Respiratory: good respiratory effort, Bilateral Air entry present and CTA, no Crackles, no wheezes ?Abdomen: Bowel Sound present, Non tender ?Extremities: no Pedal edema ?Neurology: alert and oriented to time, place, and person ?Gait not checked due to patient safety concerns ?Filed Weights  ? 08/05/21 1953  ?Weight: 78.9 kg  ? ?Condition at discharge: stable ? ?The results of significant diagnostics from this hospitalization (including imaging, microbiology, ancillary and laboratory) are listed below for reference.  ? ?Imaging Studies: ?DG Chest 2 View ? ?Result Date: 08/05/2021 ?CLINICAL DATA:  Worsening fatigue post first chemo treatment for lung cancer. EXAM:  CHEST - 2 VIEW COMPARISON:  July 17, 2021 FINDINGS: Check tubal port terminates in the distal superior vena cava. Cardiomediastinal silhouette is normal. Mediastinal contours appear intact. Persistent right hydropneumothorax.  The left lung is clear. Osseous structures are without acute abnormality. Soft tissues are grossly normal. IMPRESSION: Persistent right hydropneumothorax, similar in size when compared to July 17, 2021. Electronically Signed   By: Fidela Salisbury M.D.   On: 08/05/2021 20:18  ? ?DG Chest 2 View ? ?Result Date: 07/17/2021 ?CLINICAL DATA:  Lung cancer status post VATS on 06/27/2021 EXAM: CHEST - 2 VIEW COMPARISON:  07/10/2021 FINDINGS: The heart size and mediastinal contours are within normal limits. Similar size of small right pleural effusion with suspected residual small locules of air at its basilar component. Improving aeration of the right lung base with linear scarring/atelectasis centrally. Left lung remains clear. Resolved subcutaneous emphysema. IMPRESSION: Similar size of small right pleural effusion with suspected residual small locules of air at its basilar component. Improving aeration of the right lung base with linear scarring/atelectasis centrally. Electronically Signed   By: Davina Poke D.O.   On: 07/17/2021 12:55  ? ?DG Chest 2 View ? ?Result Date: 07/10/2021 ?CLINICAL DATA:  Pleural effusion status post chest tube removal EXAM: CHEST -  2 VIEW COMPARISON:  Chest x-ray dated July 10, 2021 FINDINGS: Visualized cardiac and mediastinal contours are unchanged. Small right pleural effusion with associated small locules air, compatible with hydropneumothorax, unchanged in size when compared with prior exam. Interval removal of right-sided chest tube. Soft tissue gas seen along the right chest wall. Unchanged heterogeneous opacities of the right lung. Left lung is clear. IMPRESSION: Unchanged size of small right hydropneumothorax status post chest tube removal. Electronically  Signed   By: Yetta Glassman M.D.   On: 07/10/2021 12:38  ? ?DG Chest 2 View ? ?Result Date: 07/10/2021 ?CLINICAL DATA:  Lung cancer.  Right VATS 06/27/2021. EXAM: CHEST - 2 VIEW COMPARISON:  07/04/2021 FI

## 2021-08-07 NOTE — Progress Notes (Signed)
Called pt to introduce myself as his Arboriculturist, discuss copay assistance and the [employer].  I left a msg requesting he return my call if he's interested in applying for the grants.  ?

## 2021-08-07 NOTE — Plan of Care (Signed)
?  Problem: Pain Managment: ?Goal: General experience of comfort will improve ?Outcome: Progressing ?  ?Problem: Activity: ?Goal: Risk for activity intolerance will decrease ?Outcome: Progressing ?  ?Problem: Nutrition: ?Goal: Adequate nutrition will be maintained ?Outcome: Progressing ?  ?

## 2021-08-08 ENCOUNTER — Inpatient Hospital Stay: Payer: Commercial Managed Care - PPO

## 2021-08-10 ENCOUNTER — Telehealth: Payer: Self-pay | Admitting: Medical Oncology

## 2021-08-10 ENCOUNTER — Encounter: Payer: Self-pay | Admitting: *Deleted

## 2021-08-10 ENCOUNTER — Other Ambulatory Visit: Payer: Self-pay | Admitting: Physician Assistant

## 2021-08-10 DIAGNOSIS — C3491 Malignant neoplasm of unspecified part of right bronchus or lung: Secondary | ICD-10-CM

## 2021-08-10 MED ORDER — ALPRAZOLAM 0.25 MG PO TABS: 0.2500 mg | ORAL_TABLET | Freq: Two times a day (BID) | ORAL | 0 refills | Status: DC | PRN

## 2021-08-10 MED ORDER — OXYCODONE HCL 5 MG PO TABS: 5.0000 mg | ORAL_TABLET | Freq: Four times a day (QID) | ORAL | 0 refills | Status: DC | PRN

## 2021-08-10 NOTE — Telephone Encounter (Signed)
I told pt that Cassie will send in short course of xanax and oxycodone. He will discuss with Julien Nordmann at next appt. ?

## 2021-08-10 NOTE — Telephone Encounter (Signed)
Anxiety/nervous-pt requests refill for xanax. ? ?Breakthrough pain-Requests refill for oxycodone for break through pain  ? ?MS contin -30 mg makes him feel overmedicated. He is now taking 15 mg bid. ?

## 2021-08-10 NOTE — Progress Notes (Signed)
Oncology Nurse Navigator Documentation ? ? ?  08/10/2021  ?  1:00 PM 07/27/2021  ?  1:00 PM 07/24/2021  ?  9:00 AM 07/24/2021  ?  8:00 AM 07/13/2021  ?  3:00 PM 07/11/2021  ?  9:00 AM 07/10/2021  ? 12:00 PM  ?Oncology Nurse Navigator Flowsheets  ?Abnormal Finding Date      05/29/2021   ?Confirmed Diagnosis Date      06/27/2021   ?Diagnosis Status      Confirmed Diagnosis Complete   ?Planned Course of Treatment  Chemotherapy;Targeted Therapy  Chemotherapy;Targeted Therapy     ?Phase of Treatment  Targeted Therapy  Targeted Therapy     ?Chemotherapy Actual Start Date:  08/06/2021       ?Targeted Therapy Actual Start Date:  08/06/2021       ?Surgery Actual Start Date: 06/27/2021        ?Navigator Follow Up Date:  08/20/2021  07/26/2021   07/23/2021  ?Navigator Follow Up Reason:  Follow-up Appointment  Appointment Review   New Patient Appointment  ?Navigator Location [facility]  ?Referral Date to RadOnc/MedOnc       07/10/2021  ?Navigator Encounter Type Appt/Treatment Plan Review Other: Telephone Appt/Treatment Plan Review Pathology Review;Molecular Studies Other: Other:  ?Telephone   Outgoing Call      ?Treatment Initiated Date 06/27/2021        ?Patient Visit Type Other   Other     ?Treatment Phase Follow-up Pre-Tx/Tx Discussion Pre-Tx/Tx Discussion Pre-Tx/Tx Discussion   Pre-Tx/Tx Discussion  ?Barriers/Navigation Needs Coordination of Care Coordination of Care Coordination of Care;Education Coordination of Care Coordination of Care Coordination of Care Coordination of Care  ?Education   Other      ?Interventions Coordination of Care/I followed up on [name] Potts plan of care. He recently was discharged from the hospital.  He is set up for follow up appt with Dr. Julien Nordmann in 2 weeks.  Coordination of Care Coordination of Care;Psycho-Social Support;Education Coordination of Care Coordination of Care Coordination of Care Coordination  of Care  ?Acuity Level 2-Minimal Needs (1-2 Barriers Identified) Level 2-Minimal Needs (1-2 Barriers Identified) Level 3-Moderate Needs (3-4 Barriers Identified) Level 2-Minimal Needs (1-2 Barriers Identified) Level 2-Minimal Needs (1-2 Barriers Identified) Level 2-Minimal Needs (1-2 Barriers Identified) Level 3-Moderate Needs (3-4 Barriers Identified)  ?Coordination of Care Other Other Appts Other Pathology Other Pathology;Other  ?Education Method   Verbal      ?Time Spent with Patient 30 30 45 30 30 15  45  ?  ?

## 2021-08-11 LAB — ACID FAST CULTURE WITH REFLEXED SENSITIVITIES (MYCOBACTERIA)
Acid Fast Culture: NEGATIVE
Acid Fast Culture: NEGATIVE

## 2021-08-13 ENCOUNTER — Other Ambulatory Visit: Payer: Commercial Managed Care - PPO

## 2021-08-13 ENCOUNTER — Ambulatory Visit: Payer: Commercial Managed Care - PPO | Admitting: Physician Assistant

## 2021-08-13 ENCOUNTER — Inpatient Hospital Stay: Payer: Commercial Managed Care - PPO

## 2021-08-14 ENCOUNTER — Telehealth: Payer: Self-pay | Admitting: Physician Assistant

## 2021-08-14 NOTE — Telephone Encounter (Signed)
Called patient regarding upcoming appointment, patient is notified. °

## 2021-08-15 ENCOUNTER — Other Ambulatory Visit: Payer: Self-pay

## 2021-08-15 ENCOUNTER — Telehealth: Payer: Self-pay

## 2021-08-15 ENCOUNTER — Inpatient Hospital Stay: Payer: Commercial Managed Care - PPO | Attending: Internal Medicine

## 2021-08-15 ENCOUNTER — Other Ambulatory Visit: Payer: Self-pay | Admitting: Internal Medicine

## 2021-08-15 DIAGNOSIS — Z7901 Long term (current) use of anticoagulants: Secondary | ICD-10-CM | POA: Diagnosis not present

## 2021-08-15 DIAGNOSIS — R112 Nausea with vomiting, unspecified: Secondary | ICD-10-CM | POA: Insufficient documentation

## 2021-08-15 DIAGNOSIS — R0602 Shortness of breath: Secondary | ICD-10-CM | POA: Diagnosis not present

## 2021-08-15 DIAGNOSIS — C3491 Malignant neoplasm of unspecified part of right bronchus or lung: Secondary | ICD-10-CM

## 2021-08-15 DIAGNOSIS — D6481 Anemia due to antineoplastic chemotherapy: Secondary | ICD-10-CM | POA: Diagnosis not present

## 2021-08-15 DIAGNOSIS — Z95828 Presence of other vascular implants and grafts: Secondary | ICD-10-CM

## 2021-08-15 DIAGNOSIS — C3432 Malignant neoplasm of lower lobe, left bronchus or lung: Secondary | ICD-10-CM | POA: Diagnosis not present

## 2021-08-15 DIAGNOSIS — K5900 Constipation, unspecified: Secondary | ICD-10-CM | POA: Insufficient documentation

## 2021-08-15 DIAGNOSIS — Z5111 Encounter for antineoplastic chemotherapy: Secondary | ICD-10-CM | POA: Diagnosis not present

## 2021-08-15 DIAGNOSIS — F909 Attention-deficit hyperactivity disorder, unspecified type: Secondary | ICD-10-CM | POA: Insufficient documentation

## 2021-08-15 DIAGNOSIS — J910 Malignant pleural effusion: Secondary | ICD-10-CM | POA: Insufficient documentation

## 2021-08-15 DIAGNOSIS — Z79899 Other long term (current) drug therapy: Secondary | ICD-10-CM | POA: Diagnosis not present

## 2021-08-15 DIAGNOSIS — E860 Dehydration: Secondary | ICD-10-CM | POA: Diagnosis not present

## 2021-08-15 LAB — CBC WITH DIFFERENTIAL (CANCER CENTER ONLY)
Abs Immature Granulocytes: 0 10*3/uL (ref 0.00–0.07)
Basophils Absolute: 0 10*3/uL (ref 0.0–0.1)
Basophils Relative: 0 %
Eosinophils Absolute: 0.1 10*3/uL (ref 0.0–0.5)
Eosinophils Relative: 3 %
HCT: 25.3 % — ABNORMAL LOW (ref 39.0–52.0)
Hemoglobin: 8 g/dL — ABNORMAL LOW (ref 13.0–17.0)
Immature Granulocytes: 0 %
Lymphocytes Relative: 48 %
Lymphs Abs: 0.9 10*3/uL (ref 0.7–4.0)
MCH: 25.1 pg — ABNORMAL LOW (ref 26.0–34.0)
MCHC: 31.6 g/dL (ref 30.0–36.0)
MCV: 79.3 fL — ABNORMAL LOW (ref 80.0–100.0)
Monocytes Absolute: 0.3 10*3/uL (ref 0.1–1.0)
Monocytes Relative: 16 %
Neutro Abs: 0.6 10*3/uL — ABNORMAL LOW (ref 1.7–7.7)
Neutrophils Relative %: 33 %
Platelet Count: 31 10*3/uL — ABNORMAL LOW (ref 150–400)
RBC: 3.19 MIL/uL — ABNORMAL LOW (ref 4.22–5.81)
RDW: 14.6 % (ref 11.5–15.5)
WBC Count: 1.9 10*3/uL — ABNORMAL LOW (ref 4.0–10.5)
nRBC: 0 % (ref 0.0–0.2)

## 2021-08-15 LAB — CMP (CANCER CENTER ONLY)
ALT: 48 U/L — ABNORMAL HIGH (ref 0–44)
AST: 40 U/L (ref 15–41)
Albumin: 3.4 g/dL — ABNORMAL LOW (ref 3.5–5.0)
Alkaline Phosphatase: 165 U/L — ABNORMAL HIGH (ref 38–126)
Anion gap: 10 (ref 5–15)
BUN: 14 mg/dL (ref 6–20)
CO2: 27 mmol/L (ref 22–32)
Calcium: 8.6 mg/dL — ABNORMAL LOW (ref 8.9–10.3)
Chloride: 97 mmol/L — ABNORMAL LOW (ref 98–111)
Creatinine: 1.48 mg/dL — ABNORMAL HIGH (ref 0.61–1.24)
GFR, Estimated: 57 mL/min — ABNORMAL LOW (ref >60)
Glucose, Bld: 155 mg/dL — ABNORMAL HIGH (ref 70–99)
Potassium: 3.8 mmol/L (ref 3.5–5.1)
Sodium: 134 mmol/L — ABNORMAL LOW (ref 135–145)
Total Bilirubin: 0.5 mg/dL (ref 0.3–1.2)
Total Protein: 6.8 g/dL (ref 6.5–8.1)

## 2021-08-15 MED ORDER — HEPARIN SOD (PORK) LOCK FLUSH 100 UNIT/ML IV SOLN
500.0000 [IU] | Freq: Once | INTRAVENOUS | Status: AC
  Administered 2021-08-15: 500 [IU]

## 2021-08-15 MED ORDER — SODIUM CHLORIDE 0.9% FLUSH
10.0000 mL | Freq: Once | INTRAVENOUS | Status: AC
  Administered 2021-08-15: 10 mL

## 2021-08-15 MED ORDER — MORPHINE SULFATE ER 15 MG PO TBCR: 15.0000 mg | EXTENDED_RELEASE_TABLET | Freq: Two times a day (BID) | ORAL | 0 refills | Status: DC

## 2021-08-15 NOTE — Telephone Encounter (Signed)
Pt requests a refill of MS Contin to be sent to [facility] on file. ?

## 2021-08-17 ENCOUNTER — Telehealth: Payer: Self-pay

## 2021-08-17 NOTE — Telephone Encounter (Signed)
Notification received from Dr. Burr Medico (on-call 08/16/21) advising the pt called last night because he is out of his MS Contin. She states the pt ran completely out of the medication. ? ?I have called pts [facility] to cancel the rx's and was asked which one we needed to cancel and learned that pt never picked up the rx dated 07/23/21. I advised her to leave the rx's as they are. ? ?I have called the pt an advised of this information and I confirmed they have his medication in stock. Pt expressed understanding of this information. ?

## 2021-08-18 ENCOUNTER — Other Ambulatory Visit: Payer: Self-pay | Admitting: Internal Medicine

## 2021-08-20 ENCOUNTER — Other Ambulatory Visit: Payer: Commercial Managed Care - PPO

## 2021-08-20 ENCOUNTER — Ambulatory Visit: Payer: Commercial Managed Care - PPO | Admitting: Physician Assistant

## 2021-08-20 ENCOUNTER — Encounter: Payer: Self-pay | Admitting: Internal Medicine

## 2021-08-20 ENCOUNTER — Ambulatory Visit: Payer: Commercial Managed Care - PPO

## 2021-08-21 ENCOUNTER — Encounter: Payer: Self-pay | Admitting: Internal Medicine

## 2021-08-21 NOTE — Progress Notes (Signed)
[facility] ?OFFICE PROGRESS NOTE ? ?[name] Mariscal, MD ?[address] ?[place] [address] ? 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 ?His molecular studies showed no actionable mutation and PD-L1 expression was 1%. It showed KRAS G12D, Myc amplification, CCND3 amplification, TP53 R248L, VEGFA amplification but these are not actionable at this time. ?  ?PRIOR THERAPY:None ? ?CURRENT THERAPY: carboplatin for AUC of 5, Alimta 500 Mg/M2 and Keytruda 200 Mg IV every 3 weeks. First dose on 08/01/21. Status post 1 cycle. Alimta dose reduced to 400 mg/m2 starting from cycle #2.  ? ?INTERVAL HISTORY: ?[name] Potts 51 y.o. male returns to the clinic today for a follow-up visit accompanied by his wife. The patient was recently diagnosed with stage IV lung cancer.  He is currently undergoing palliative systemic chemotherapy.  He received his first dose of treatment on 08/01/2021. He felt alright for the first 2-3 days after treatment. After that, he was sleeping a lot and not eating since he was mostly in bed. The patient developed fatigue, nausea, and anorexia which led to dehydration, hyponatremia, and AKI.  He was admitted from 08/05/2021 to 08/07/2021.  He received IV fluids with improvement in his creatinine.  His creatinine upon admission was 1.66. They are wondering if he can get IVF on Saturday 08/25/21 since that is when he felt poorly after cycle #1 to try to prevent hospital visit. He also notes that the xanax helped his anxiety and nausea with cycle #1. He reportedly was having panic attacks. He denied any vomiting.  ? ?He notes he has been feeling cold lately. He was anemic at baseline and is wondering what his labs show and if he need a blood transfusion. He  is taking integra plus as well.  ? ?  He denies any fevers or night sweats. He lost a few pounds since last being seen but notes he did gain back some of the weight compared to last week. He is drinking a lot of water, carnation breakfast essentials, and body armour. He notes his appetite is "picking up". It also looks like the patient is on 30 mg of remeron.  ? ?He reports his baseline shortness of breath. Of note the patient has a hydropneumothorax.  The patient denies any unusual cough.  Denies any hemoptysis. He continues to have some right rib pain for which he takes MS contin 15 mg BID. He denies any abnormal bleeding or bruising.  The patient is on Eliquis for recent DVT. He had constipation following the first cycle of treatment which likely was from his pain medication use as well as treatment. He now is taking laxatives and Senakot.  Denies any headache or visual changes.  Denies any rashes or skin changes.  He is here today for evaluation and repeat blood work before considering starting cycle #2. ? ? ? ?MEDICAL HISTORY: ?Past Medical History:  ?Diagnosis Date  ? ADHD (attention deficit hyperactivity disorder)   ? Chronic back pain   ? Complication of anesthesia   ? woke up during 1 of 3 colonoscopies  ? Constipation   ? Dyspnea   ? Pneumonia   ? Seasonal allergies   ? ? ?ALLERGIES:  has No Known Allergies. ? ?MEDICATIONS:  ?Current Outpatient Medications  ?Medication Sig Dispense Refill  ?  ALPRAZolam (XANAX) 0.25 MG tablet Take 1 tablet (0.25 mg total) by mouth 2 (two) times daily as needed for anxiety. 30 tablet 0  ? apixaban (ELIQUIS) 5 MG TABS tablet Take 2 tablets ($RemoveBe'10mg'MDdVhzRBs$ ) twice daily for 7 days, then 1 tablet ($RemoveB'5mg'sCXUqxvd$ ) twice daily (Patient taking differently: Take 5 mg by mouth 2 (two) times daily.) 60 tablet 3  ? atorvastatin (LIPITOR) 40 MG tablet Take 1 tablet (40 mg total) by mouth daily. (Patient not taking: Reported on 08/05/2021) 90 tablet 1  ? FeFum-FePoly-FA-B Cmp-C-Biot (INTEGRA PLUS) CAPS Take 1  capsule by mouth daily. 30 capsule 3  ? folic acid (FOLVITE) 1 MG tablet Take 1 tablet (1 mg total) by mouth daily. 30 tablet 4  ? lidocaine-prilocaine (EMLA) cream Apply to the Port-A-Cath site 30 minutes before chemotherapy 30 g 0  ? mirtazapine (REMERON) 30 MG tablet Take 1 tablet (30 mg total) by mouth at bedtime. 30 tablet 2  ? morphine (MS CONTIN) 15 MG 12 hr tablet Take 1 tablet (15 mg total) by mouth every 12 (twelve) hours. 60 tablet 0  ? oxyCODONE (OXY IR/ROXICODONE) 5 MG immediate release tablet Take 1 tablet (5 mg total) by mouth every 6 (six) hours as needed for moderate pain or severe pain. 30 tablet 0  ? polyethylene glycol (MIRALAX / GLYCOLAX) 17 g packet Take 17 g by mouth daily as needed for mild constipation. 14 each 0  ? prochlorperazine (COMPAZINE) 10 MG tablet TAKE 1 TABLET BY MOUTH EVERY 6 HOURS AS NEEDED FOR NAUSEA FOR VOMITING 30 tablet 0  ? ?No current facility-administered medications for this visit.  ? ?Facility-Administered Medications Ordered in Other Visits  ?Medication Dose Route Frequency Provider Last Rate Last Admin  ? CARBOplatin (PARAPLATIN) 450 mg in sodium chloride 0.9 % 250 mL chemo infusion  450 mg Intravenous Once Curt Bears, MD      ? dexamethasone (DECADRON) 10 mg in sodium chloride 0.9 % 50 mL IVPB  10 mg Intravenous Once Curt Bears, MD      ? fosaprepitant (EMEND) 150 mg in sodium chloride 0.9 % 145 mL IVPB  150 mg Intravenous Once Curt Bears, MD      ? heparin lock flush 100 unit/mL  500 Units Intracatheter Once PRN Curt Bears, MD      ? palonosetron (ALOXI) injection 0.25 mg  0.25 mg Intravenous Once Curt Bears, MD      ? pembrolizumab [facility]) 200 mg in sodium chloride 0.9 % 50 mL chemo infusion  200 mg Intravenous Once Curt Bears, MD      ? PEMEtrexed (ALIMTA) 800 mg in sodium chloride 0.9 % 100 mL chemo infusion  400 mg/m2 (Treatment Plan Recorded) Intravenous Once Curt Bears, MD      ? sodium chloride flush (NS) 0.9 %  injection 10 mL  10 mL Intracatheter PRN Curt Bears, MD      ? ? ?SURGICAL HISTORY:  ?Past Surgical History:  ?Procedure Laterality Date  ? COLONOSCOPY  10/11/2011  ? Procedure: COLONOSCOPY;  Surgeon: Daneil Dolin, MD;  [facility];  Service: Endoscopy;  Laterality: N/A;  1:45 PM  ? COLONOSCOPY N/A 01/23/2018  ? Procedure: COLONOSCOPY;  Surgeon: Daneil Dolin, MD;  [facility];  Service: Endoscopy;  Laterality: N/A;  1:00  ? IR IMAGING GUIDED PORT INSERTION  07/27/2021  ? NECK SURGERY    ? Apr 05, 2021- per pt report cervical surgery at day surgery center  ? POLYPECTOMY  01/23/2018  ? Procedure:  POLYPECTOMY;  Surgeon: Daneil Dolin, MD;  [facility];  Service: Endoscopy;;  ? VIDEO ASSISTED THORACOSCOPY (VATS)/DECORTICATION Right 06/27/2021  ? Procedure: VIDEO ASSISTED THORACOSCOPY (VATS), Drainage of RIGHT pleural effusion/ DECORTICATION;  Surgeon: Melrose Nakayama, MD;  Location: [facility];  Service: Thoracic;  Laterality: Right;  ? ? ?REVIEW OF SYSTEMS:   ?Review of Systems  ?Constitutional: Positive for decreased but improving appetite. Positive for weight loss. Positive for fatigue (improving). Positive for chills. Negative for  fever ?HENT:   Negative for mouth sores, nosebleeds, sore throat and trouble swallowing.   ?Eyes: Negative for eye problems and icterus.  ?Respiratory: Positive fos dyspnea on exertion. Negative for cough, hemoptysis, and wheezing.   ?Cardiovascular: Positive for right sided rib pain. Negative for leg swelling.  ?Gastrointestinal: Positive for constipation. Negative for abdominal pain, diarrhea, nausea and vomiting.  ?Genitourinary: Negative for bladder incontinence, difficulty urinating, dysuria, frequency and hematuria.   ?Musculoskeletal: Negative for back pain, gait problem, neck pain and neck stiffness.  ?Skin: Negative for itching and rash.  ?Neurological: Negative for dizziness, extremity weakness, gait problem, headaches,  light-headedness and seizures.  ?Hematological: Negative for adenopathy. Does not bruise/bleed easily.  ?Psychiatric/Behavioral: Negative for confusion, depression and sleep disturbance. The patient is not nervous/

## 2021-08-22 ENCOUNTER — Other Ambulatory Visit: Payer: Self-pay | Admitting: Physician Assistant

## 2021-08-22 ENCOUNTER — Inpatient Hospital Stay: Payer: Commercial Managed Care - PPO

## 2021-08-22 ENCOUNTER — Other Ambulatory Visit: Payer: Self-pay

## 2021-08-22 ENCOUNTER — Inpatient Hospital Stay (HOSPITAL_BASED_OUTPATIENT_CLINIC_OR_DEPARTMENT_OTHER): Payer: Commercial Managed Care - PPO | Admitting: Physician Assistant

## 2021-08-22 VITALS — BP 127/72 | HR 107 | Temp 97.6°F | Resp 17 | Wt 176.9 lb

## 2021-08-22 DIAGNOSIS — C3491 Malignant neoplasm of unspecified part of right bronchus or lung: Secondary | ICD-10-CM

## 2021-08-22 DIAGNOSIS — R7989 Other specified abnormal findings of blood chemistry: Secondary | ICD-10-CM

## 2021-08-22 DIAGNOSIS — F419 Anxiety disorder, unspecified: Secondary | ICD-10-CM | POA: Insufficient documentation

## 2021-08-22 DIAGNOSIS — Z5111 Encounter for antineoplastic chemotherapy: Secondary | ICD-10-CM

## 2021-08-22 DIAGNOSIS — T451X5A Adverse effect of antineoplastic and immunosuppressive drugs, initial encounter: Secondary | ICD-10-CM

## 2021-08-22 DIAGNOSIS — Z5112 Encounter for antineoplastic immunotherapy: Secondary | ICD-10-CM

## 2021-08-22 DIAGNOSIS — C3432 Malignant neoplasm of lower lobe, left bronchus or lung: Secondary | ICD-10-CM | POA: Diagnosis not present

## 2021-08-22 DIAGNOSIS — D6481 Anemia due to antineoplastic chemotherapy: Secondary | ICD-10-CM | POA: Insufficient documentation

## 2021-08-22 DIAGNOSIS — G893 Neoplasm related pain (acute) (chronic): Secondary | ICD-10-CM

## 2021-08-22 DIAGNOSIS — Z95828 Presence of other vascular implants and grafts: Secondary | ICD-10-CM

## 2021-08-22 LAB — CMP (CANCER CENTER ONLY)
ALT: 34 U/L (ref 0–44)
AST: 36 U/L (ref 15–41)
Albumin: 3.2 g/dL — ABNORMAL LOW (ref 3.5–5.0)
Alkaline Phosphatase: 161 U/L — ABNORMAL HIGH (ref 38–126)
Anion gap: 8 (ref 5–15)
BUN: 15 mg/dL (ref 6–20)
CO2: 29 mmol/L (ref 22–32)
Calcium: 8.6 mg/dL — ABNORMAL LOW (ref 8.9–10.3)
Chloride: 98 mmol/L (ref 98–111)
Creatinine: 1.55 mg/dL — ABNORMAL HIGH (ref 0.61–1.24)
GFR, Estimated: 54 mL/min — ABNORMAL LOW (ref >60)
Glucose, Bld: 134 mg/dL — ABNORMAL HIGH (ref 70–99)
Potassium: 3.8 mmol/L (ref 3.5–5.1)
Sodium: 135 mmol/L (ref 135–145)
Total Bilirubin: 0.3 mg/dL (ref 0.3–1.2)
Total Protein: 7.1 g/dL (ref 6.5–8.1)

## 2021-08-22 LAB — CBC WITH DIFFERENTIAL (CANCER CENTER ONLY)
Abs Immature Granulocytes: 0.46 10*3/uL — ABNORMAL HIGH (ref 0.00–0.07)
Basophils Absolute: 0 10*3/uL (ref 0.0–0.1)
Basophils Relative: 0 %
Eosinophils Absolute: 0.1 10*3/uL (ref 0.0–0.5)
Eosinophils Relative: 1 %
HCT: 23.1 % — ABNORMAL LOW (ref 39.0–52.0)
Hemoglobin: 7.6 g/dL — ABNORMAL LOW (ref 13.0–17.0)
Immature Granulocytes: 6 %
Lymphocytes Relative: 19 %
Lymphs Abs: 1.5 10*3/uL (ref 0.7–4.0)
MCH: 26.2 pg (ref 26.0–34.0)
MCHC: 32.9 g/dL (ref 30.0–36.0)
MCV: 79.7 fL — ABNORMAL LOW (ref 80.0–100.0)
Monocytes Absolute: 1 10*3/uL (ref 0.1–1.0)
Monocytes Relative: 13 %
Neutro Abs: 4.7 10*3/uL (ref 1.7–7.7)
Neutrophils Relative %: 61 %
Platelet Count: 354 10*3/uL (ref 150–400)
RBC: 2.9 MIL/uL — ABNORMAL LOW (ref 4.22–5.81)
RDW: 17.2 % — ABNORMAL HIGH (ref 11.5–15.5)
Smear Review: NORMAL
WBC Count: 7.8 10*3/uL (ref 4.0–10.5)
nRBC: 0.3 % — ABNORMAL HIGH (ref 0.0–0.2)

## 2021-08-22 LAB — TSH: TSH: 1.936 u[IU]/mL (ref 0.350–4.500)

## 2021-08-22 LAB — PREPARE RBC (CROSSMATCH)

## 2021-08-22 MED ORDER — HEPARIN SOD (PORK) LOCK FLUSH 100 UNIT/ML IV SOLN
500.0000 [IU] | Freq: Once | INTRAVENOUS | Status: AC | PRN
  Administered 2021-08-22: 500 [IU]

## 2021-08-22 MED ORDER — SODIUM CHLORIDE 0.9 % IV SOLN
10.0000 mg | Freq: Once | INTRAVENOUS | Status: AC
  Administered 2021-08-22: 10 mg via INTRAVENOUS
  Filled 2021-08-22: qty 10

## 2021-08-22 MED ORDER — SODIUM CHLORIDE 0.9 % IV SOLN
446.5000 mg | Freq: Once | INTRAVENOUS | Status: AC
  Administered 2021-08-22: 450 mg via INTRAVENOUS
  Filled 2021-08-22: qty 45

## 2021-08-22 MED ORDER — SODIUM CHLORIDE 0.9% FLUSH
10.0000 mL | INTRAVENOUS | Status: DC | PRN
  Administered 2021-08-22: 10 mL

## 2021-08-22 MED ORDER — PALONOSETRON HCL INJECTION 0.25 MG/5ML
0.2500 mg | Freq: Once | INTRAVENOUS | Status: AC
  Administered 2021-08-22: 0.25 mg via INTRAVENOUS
  Filled 2021-08-22: qty 5

## 2021-08-22 MED ORDER — SODIUM CHLORIDE 0.9% FLUSH
10.0000 mL | Freq: Once | INTRAVENOUS | Status: AC
  Administered 2021-08-22: 10 mL

## 2021-08-22 MED ORDER — ALPRAZOLAM 0.25 MG PO TABS: 0.2500 mg | ORAL_TABLET | Freq: Two times a day (BID) | ORAL | 0 refills | Status: DC | PRN

## 2021-08-22 MED ORDER — SODIUM CHLORIDE 0.9 % IV SOLN
400.0000 mg/m2 | Freq: Once | INTRAVENOUS | Status: AC
  Administered 2021-08-22: 800 mg via INTRAVENOUS
  Filled 2021-08-22: qty 20

## 2021-08-22 MED ORDER — SODIUM CHLORIDE 0.9 % IV SOLN: Freq: Once | INTRAVENOUS | Status: AC

## 2021-08-22 MED ORDER — SODIUM CHLORIDE 0.9 % IV SOLN
200.0000 mg | Freq: Once | INTRAVENOUS | Status: AC
  Administered 2021-08-22: 200 mg via INTRAVENOUS
  Filled 2021-08-22: qty 200

## 2021-08-22 MED ORDER — SODIUM CHLORIDE 0.9 % IV SOLN
150.0000 mg | Freq: Once | INTRAVENOUS | Status: AC
  Administered 2021-08-22: 150 mg via INTRAVENOUS
  Filled 2021-08-22: qty 150

## 2021-08-22 NOTE — Patient Instructions (Signed)
[facility]  Discharge Instructions: Thank you for choosing [facility] to provide your oncology and hematology care.   If you have a lab appointment with the Cancer Center, please go directly to the Cancer Center and check in at the registration area.   Wear comfortable clothing and clothing appropriate for easy access to any Portacath or PICC line.   We strive to give you quality time with your provider. You may need to reschedule your appointment if you arrive late (15 or more minutes).  Arriving late affects you and other patients whose appointments are after yours.  Also, if you miss three or more appointments without notifying the office, you may be dismissed from the clinic at the provider's discretion.      For prescription refill requests, have your pharmacy contact our office and allow 72 hours for refills to be completed.    Today you received the following chemotherapy and/or immunotherapy agents pembrolizumab, pemetrexed, carboplatin      To help prevent nausea and vomiting after your treatment, we encourage you to take your nausea medication as directed.  BELOW ARE SYMPTOMS THAT SHOULD BE REPORTED IMMEDIATELY: *FEVER GREATER THAN 100.4 F (38 C) OR HIGHER *CHILLS OR SWEATING *NAUSEA AND VOMITING THAT IS NOT CONTROLLED WITH YOUR NAUSEA MEDICATION *UNUSUAL SHORTNESS OF BREATH *UNUSUAL BRUISING OR BLEEDING *URINARY PROBLEMS (pain or burning when urinating, or frequent urination) *BOWEL PROBLEMS (unusual diarrhea, constipation, pain near the anus) TENDERNESS IN MOUTH AND THROAT WITH OR WITHOUT PRESENCE OF ULCERS (sore throat, sores in mouth, or a toothache) UNUSUAL RASH, SWELLING OR PAIN  UNUSUAL VAGINAL DISCHARGE OR ITCHING   Items with * indicate a potential emergency and should be followed up as soon as possible or go to the Emergency Department if any problems should occur.  Please show the CHEMOTHERAPY ALERT CARD or  IMMUNOTHERAPY ALERT CARD at check-in to the Emergency Department and triage nurse.  Should you have questions after your visit or need to cancel or reschedule your appointment, please contact [facility]  Dept: [phone]  and follow the prompts.  Office hours are 8:00 a.m. to 4:30 p.m. Monday - Friday. Please note that voicemails left after 4:00 p.m. may not be returned until the following business day.  We are closed weekends and major holidays. You have access to a nurse at all times for urgent questions. Please call the main number to the clinic Dept: [phone] and follow the prompts.   For any non-urgent questions, you may also contact your provider using MyChart. We now offer e-Visits for anyone 18 and older to request care online for non-urgent symptoms. For details visit [email or web address].   Also download the MyChart app! Go to the app store, search "MyChart", open the app, select [facility], and log in with your MyChart username and password.  Due to Covid, a mask is required upon entering the hospital/clinic. If you do not have a mask, one will be given to you upon arrival. For doctor visits, patients may have 1 support person aged 18 or older with them. For treatment visits, patients cannot have anyone with them due to current Covid guidelines and our immunocompromised population.   

## 2021-08-22 NOTE — Progress Notes (Signed)
Labs and vitals reviewed by Cassie H PA and ok to treat has been given.  ?Pt instructed on importance of keeping blue blood bank bracelet on.  ?

## 2021-08-23 ENCOUNTER — Inpatient Hospital Stay: Payer: Commercial Managed Care - PPO

## 2021-08-23 ENCOUNTER — Other Ambulatory Visit: Payer: Self-pay | Admitting: Physician Assistant

## 2021-08-23 DIAGNOSIS — C3432 Malignant neoplasm of lower lobe, left bronchus or lung: Secondary | ICD-10-CM | POA: Diagnosis not present

## 2021-08-23 DIAGNOSIS — C3491 Malignant neoplasm of unspecified part of right bronchus or lung: Secondary | ICD-10-CM

## 2021-08-23 MED ORDER — HEPARIN SOD (PORK) LOCK FLUSH 100 UNIT/ML IV SOLN
250.0000 [IU] | INTRAVENOUS | Status: AC | PRN
  Administered 2021-08-23: 500 [IU]

## 2021-08-23 MED ORDER — SODIUM CHLORIDE 0.9% FLUSH
10.0000 mL | INTRAVENOUS | Status: AC | PRN
  Administered 2021-08-23: 10 mL

## 2021-08-23 MED ORDER — SODIUM CHLORIDE 0.9% IV SOLUTION
250.0000 mL | Freq: Once | INTRAVENOUS | Status: AC
  Administered 2021-08-23: 250 mL via INTRAVENOUS

## 2021-08-23 MED ORDER — DIPHENHYDRAMINE HCL 50 MG/ML IJ SOLN
25.0000 mg | Freq: Once | INTRAMUSCULAR | Status: AC
  Administered 2021-08-23: 25 mg via INTRAVENOUS
  Filled 2021-08-23: qty 1

## 2021-08-23 MED ORDER — ACETAMINOPHEN 325 MG PO TABS
650.0000 mg | ORAL_TABLET | Freq: Once | ORAL | Status: AC
  Administered 2021-08-23: 650 mg via ORAL
  Filled 2021-08-23: qty 2

## 2021-08-23 NOTE — Progress Notes (Signed)
Went to check on the patient while receiving blood today, Received message from the RN about dizziness/balance. The patient is anemic. He also has not been eating well. He also is on multiple sedating medications. He has MS contin 15. He states he was told he can take 2 tablets BID. Unclear who told him this. I do not recommend this as he is on other medications to cause drowsiness. He received benadryl premedications today and is prescribed oxycodone and xanax. I had a long discussion with the patient on xanax yesterday that this is only to be used sparingly for panic attacks or nausea not controlled with compazine. Discussed this would not be a long term medication. He has equal and symmetric strength in his upper and lower extremities. Cranial nerves grossly abnormal on evaluation today. He did seem light headed upon standing. May be combination of mediations and anemia. Advised to monitor for now. Hopefully improvement in his symptoms after receiving the blood. He also is scheduled to receive IVF on Saturday 08/25/21. Of course, if worsening symptoms or new symptoms advised to call us back. Of course, if symptoms of stroke like symptoms such as slurred speech, extremity weakness, facial drooping, etc to seek emergency evaluation. If persistent balance changes, we can consider repeat neuroimaging. His most recent CT of the head (unable to have MRI due to metal in his neck) in march did not show metastatic disease. I discussed with Dr. Julien Nordmann who advised to monitor symptoms for now. He does not think it would be likely to develop metastatic disease to the brain in the short interval. The patient is in agreement with this plan.  ?

## 2021-08-24 LAB — TYPE AND SCREEN
ABO/RH(D): O POS
Antibody Screen: NEGATIVE
Unit division: 0
Unit division: 0

## 2021-08-24 LAB — BPAM RBC
Blood Product Expiration Date: 202306052359
Blood Product Expiration Date: 202306052359
ISSUE DATE / TIME: 202305111119
ISSUE DATE / TIME: 202305111119
Unit Type and Rh: 5100
Unit Type and Rh: 5100

## 2021-08-25 ENCOUNTER — Inpatient Hospital Stay: Payer: Commercial Managed Care - PPO

## 2021-08-27 ENCOUNTER — Telehealth: Payer: Self-pay | Admitting: *Deleted

## 2021-08-27 ENCOUNTER — Other Ambulatory Visit: Payer: Commercial Managed Care - PPO

## 2021-08-27 ENCOUNTER — Other Ambulatory Visit: Payer: Self-pay | Admitting: Thoracic Surgery (Cardiothoracic Vascular Surgery)

## 2021-08-27 DIAGNOSIS — C3491 Malignant neoplasm of unspecified part of right bronchus or lung: Secondary | ICD-10-CM

## 2021-08-28 ENCOUNTER — Ambulatory Visit
Admission: RE | Admit: 2021-08-28 | Discharge: 2021-08-28 | Disposition: A | Discharge status: Home / Self Care | Payer: Commercial Managed Care - PPO | Source: Ambulatory Visit | Attending: Thoracic Surgery (Cardiothoracic Vascular Surgery) | Admitting: Thoracic Surgery (Cardiothoracic Vascular Surgery)

## 2021-08-28 ENCOUNTER — Encounter: Payer: Self-pay | Admitting: Thoracic Surgery (Cardiothoracic Vascular Surgery)

## 2021-08-28 ENCOUNTER — Ambulatory Visit (INDEPENDENT_AMBULATORY_CARE_PROVIDER_SITE_OTHER): Payer: Self-pay | Admitting: Thoracic Surgery (Cardiothoracic Vascular Surgery)

## 2021-08-28 VITALS — BP 106/70 | HR 111 | Resp 20 | Ht 72.0 in | Wt 171.0 lb

## 2021-08-28 DIAGNOSIS — C3491 Malignant neoplasm of unspecified part of right bronchus or lung: Secondary | ICD-10-CM

## 2021-08-28 NOTE — Progress Notes (Signed)
? ?   ?[address] ?      [place] [address] ?            [phone]   ? ? ?HPI: [name] Potts returns for scheduled follow-up visit ? ?[name] Potts is a 52 year old male with a history of smoking who presented with cough and congestion and was found to have a large right pleural effusion.  I did a right VATS on 06/27/2021.  He had extensive metastatic adenocarcinoma.  His lower lobe was entrapped and I attempted to decorticate that.  He had an air leak.  Ultimately agreeable to get his tube out.  He still has a basilar space. ? ?Continues to have pain in the right chest.  Mostly centered around the incision.  He is currently taking MS Contin and oxycodone for breakthrough pain. ? ?Past Medical History:  ?Diagnosis Date  ? ADHD (attention deficit hyperactivity disorder)   ? Chronic back pain   ? Complication of anesthesia   ? woke up during 1 of 3 colonoscopies  ? Constipation   ? Dyspnea   ? Pneumonia   ? Seasonal allergies   ? ? ?Current Outpatient Medications  ?Medication Sig Dispense Refill  ? ALPRAZolam (XANAX) 0.25 MG tablet Take 1 tablet (0.25 mg total) by mouth 2 (two) times daily as needed for anxiety. 30 tablet 0  ? apixaban (ELIQUIS) 5 MG TABS tablet Take 2 tablets (10mg ) twice daily for 7 days, then 1 tablet (5mg ) twice daily (Patient taking differently: Take 5 mg by mouth 2 (two) times daily.) 60 tablet 3  ? atorvastatin (LIPITOR) 40 MG tablet Take 1 tablet (40 mg total) by mouth daily. 90 tablet 1  ? FeFum-FePoly-FA-B Cmp-C-Biot (INTEGRA PLUS) CAPS Take 1 capsule by mouth daily. 30 capsule 3  ? folic acid (FOLVITE) 1 MG tablet Take 1 tablet (1 mg total) by mouth daily. 30 tablet 4  ? lidocaine-prilocaine (EMLA) cream Apply to the Port-A-Cath site 30 minutes before chemotherapy 30 g 0  ? mirtazapine (REMERON) 30 MG tablet Take 1 tablet (30 mg total) by mouth at bedtime. 30 tablet 2  ? morphine (MS CONTIN) 15 MG 12 hr tablet Take 1 tablet (15 mg total) by mouth every 12 (twelve) hours. 60  tablet 0  ? oxyCODONE (OXY IR/ROXICODONE) 5 MG immediate release tablet Take 1 tablet (5 mg total) by mouth every 6 (six) hours as needed for moderate pain or severe pain. 30 tablet 0  ? polyethylene glycol (MIRALAX / GLYCOLAX) 17 g packet Take 17 g by mouth daily as needed for mild constipation. 14 each 0  ? prochlorperazine (COMPAZINE) 10 MG tablet TAKE 1 TABLET BY MOUTH EVERY 6 HOURS AS NEEDED FOR NAUSEA FOR VOMITING 30 tablet 0  ? ?No current facility-administered medications for this visit.  ? ? ?Physical Exam ?BP 106/70 (BP Location: Right Arm, Patient Position: Sitting)   Pulse (!) 111   Resp 20   Ht 6' (1.829 m)   Wt 171 lb (77.6 kg)   SpO2 93% Comment: RA  BMI 23.19 kg/m?  ?51 year old man in no acute distress ?Alert and oriented x3 ?Incision healing well ?Tachycardic ?Absent breath sounds right base ? ?Diagnostic Tests: ?Chest x-ray shows persistent hydropneumothorax at right base.  Unchanged from chest x-ray a month ago. ? ?Impression: ?[name] Potts is a 51 year old male with history of tobacco abuse who was recently diagnosed with stage IV adenocarcinoma of the lung and a DVT.  He is currently undergoing chemotherapy. ? ?He  presented with a large pleural effusion and trapped right lung.  Tried to decorticate the lung is much as possible but the lung never completely reexpanded.  He had a pretty significant air leak initially but ultimately were able to get his tube out.  The right lung base is still trapped.  His chest x-ray is stable and he is doing reasonably well from a respiratory standpoint. ? ?He is currently undergoing chemotherapy.  That resulted in a hospital admission for dehydration initially.  He tolerated the second cycle a little better. ? ?Plan: ?Follow-up with Dr. Julien Nordmann ?I will be happy to see [name] back anytime in the future if I can be of any further assistance with his care ? ?Melrose Nakayama, MD ?[facility] ?[phone] ? ? ? ?

## 2021-08-29 ENCOUNTER — Inpatient Hospital Stay: Payer: Commercial Managed Care - PPO

## 2021-08-29 ENCOUNTER — Other Ambulatory Visit: Payer: Self-pay

## 2021-08-29 DIAGNOSIS — C3432 Malignant neoplasm of lower lobe, left bronchus or lung: Secondary | ICD-10-CM | POA: Diagnosis not present

## 2021-08-29 DIAGNOSIS — C3491 Malignant neoplasm of unspecified part of right bronchus or lung: Secondary | ICD-10-CM

## 2021-08-29 DIAGNOSIS — Z95828 Presence of other vascular implants and grafts: Secondary | ICD-10-CM

## 2021-08-29 LAB — CBC WITH DIFFERENTIAL (CANCER CENTER ONLY)
Abs Immature Granulocytes: 0.02 10*3/uL (ref 0.00–0.07)
Basophils Absolute: 0 10*3/uL (ref 0.0–0.1)
Basophils Relative: 1 %
Eosinophils Absolute: 0 10*3/uL (ref 0.0–0.5)
Eosinophils Relative: 0 %
HCT: 32.9 % — ABNORMAL LOW (ref 39.0–52.0)
Hemoglobin: 10.9 g/dL — ABNORMAL LOW (ref 13.0–17.0)
Immature Granulocytes: 1 %
Lymphocytes Relative: 30 %
Lymphs Abs: 0.9 10*3/uL (ref 0.7–4.0)
MCH: 27 pg (ref 26.0–34.0)
MCHC: 33.1 g/dL (ref 30.0–36.0)
MCV: 81.6 fL (ref 80.0–100.0)
Monocytes Absolute: 0.2 10*3/uL (ref 0.1–1.0)
Monocytes Relative: 5 %
Neutro Abs: 2 10*3/uL (ref 1.7–7.7)
Neutrophils Relative %: 63 %
Platelet Count: 54 10*3/uL — ABNORMAL LOW (ref 150–400)
RBC: 4.03 MIL/uL — ABNORMAL LOW (ref 4.22–5.81)
RDW: 17.4 % — ABNORMAL HIGH (ref 11.5–15.5)
WBC Count: 3.1 10*3/uL — ABNORMAL LOW (ref 4.0–10.5)
nRBC: 0 % (ref 0.0–0.2)

## 2021-08-29 LAB — CMP (CANCER CENTER ONLY)
ALT: 39 U/L (ref 0–44)
AST: 42 U/L — ABNORMAL HIGH (ref 15–41)
Albumin: 3.1 g/dL — ABNORMAL LOW (ref 3.5–5.0)
Alkaline Phosphatase: 145 U/L — ABNORMAL HIGH (ref 38–126)
Anion gap: 10 (ref 5–15)
BUN: 24 mg/dL — ABNORMAL HIGH (ref 6–20)
CO2: 26 mmol/L (ref 22–32)
Calcium: 8.9 mg/dL (ref 8.9–10.3)
Chloride: 99 mmol/L (ref 98–111)
Creatinine: 1.36 mg/dL — ABNORMAL HIGH (ref 0.61–1.24)
GFR, Estimated: >60 mL/min (ref >60)
Glucose, Bld: 135 mg/dL — ABNORMAL HIGH (ref 70–99)
Potassium: 4.5 mmol/L (ref 3.5–5.1)
Sodium: 135 mmol/L (ref 135–145)
Total Bilirubin: 0.8 mg/dL (ref 0.3–1.2)
Total Protein: 7.2 g/dL (ref 6.5–8.1)

## 2021-08-29 LAB — SAMPLE TO BLOOD BANK

## 2021-08-29 MED ORDER — HEPARIN SOD (PORK) LOCK FLUSH 100 UNIT/ML IV SOLN
500.0000 [IU] | Freq: Once | INTRAVENOUS | Status: AC
  Administered 2021-08-29: 500 [IU]

## 2021-08-29 MED ORDER — SODIUM CHLORIDE 0.9% FLUSH
10.0000 mL | Freq: Once | INTRAVENOUS | Status: AC
  Administered 2021-08-29: 10 mL

## 2021-08-31 ENCOUNTER — Telehealth: Payer: Self-pay

## 2021-08-31 NOTE — Telephone Encounter (Signed)
Pt left a message requesting a refill of Oxycodone 5mg .

## 2021-09-01 ENCOUNTER — Other Ambulatory Visit: Payer: Self-pay | Admitting: Internal Medicine

## 2021-09-01 DIAGNOSIS — C3491 Malignant neoplasm of unspecified part of right bronchus or lung: Secondary | ICD-10-CM

## 2021-09-01 MED ORDER — OXYCODONE HCL 5 MG PO TABS: 5.0000 mg | ORAL_TABLET | Freq: Four times a day (QID) | ORAL | 0 refills | Status: DC | PRN

## 2021-09-03 ENCOUNTER — Other Ambulatory Visit: Payer: Commercial Managed Care - PPO

## 2021-09-03 ENCOUNTER — Telehealth: Payer: Self-pay | Admitting: Medical Oncology

## 2021-09-03 ENCOUNTER — Other Ambulatory Visit: Payer: Self-pay | Admitting: Physician Assistant

## 2021-09-03 DIAGNOSIS — Z5111 Encounter for antineoplastic chemotherapy: Secondary | ICD-10-CM

## 2021-09-03 DIAGNOSIS — C3491 Malignant neoplasm of unspecified part of right bronchus or lung: Secondary | ICD-10-CM

## 2021-09-03 MED ORDER — ALPRAZOLAM 0.25 MG PO TABS: 0.2500 mg | ORAL_TABLET | Freq: Two times a day (BID) | ORAL | 0 refills | Status: DC | PRN

## 2021-09-03 MED ORDER — PROCHLORPERAZINE MALEATE 10 MG PO TABS: ORAL_TABLET | ORAL | 0 refills | Status: DC

## 2021-09-03 MED ORDER — PROCHLORPERAZINE MALEATE 10 MG PO TABS: ORAL_TABLET | ORAL | 2 refills | Status: AC

## 2021-09-03 NOTE — Addendum Note (Signed)
Addended by: Ardeen Garland on: 09/03/2021 12:37 PM   Modules accepted: Orders

## 2021-09-03 NOTE — Telephone Encounter (Signed)
Refills requested for xanax , compazine.

## 2021-09-03 NOTE — Telephone Encounter (Signed)
Xanax will not be filled today . The earliest will be Friday . I explained to pt that [name] Potts  did not expect him to be out so soon. I told him I was concerned he sounded a little sedated. He is also taking [place] and oxycodone  for breakthrough pain.He voiced understanding.

## 2021-09-05 ENCOUNTER — Inpatient Hospital Stay: Payer: Commercial Managed Care - PPO

## 2021-09-05 ENCOUNTER — Other Ambulatory Visit: Payer: Self-pay

## 2021-09-05 DIAGNOSIS — C3491 Malignant neoplasm of unspecified part of right bronchus or lung: Secondary | ICD-10-CM

## 2021-09-05 DIAGNOSIS — C3432 Malignant neoplasm of lower lobe, left bronchus or lung: Secondary | ICD-10-CM | POA: Diagnosis not present

## 2021-09-05 DIAGNOSIS — Z95828 Presence of other vascular implants and grafts: Secondary | ICD-10-CM

## 2021-09-05 LAB — CMP (CANCER CENTER ONLY)
ALT: 35 U/L (ref 0–44)
AST: 32 U/L (ref 15–41)
Albumin: 3.5 g/dL (ref 3.5–5.0)
Alkaline Phosphatase: 177 U/L — ABNORMAL HIGH (ref 38–126)
Anion gap: 7 (ref 5–15)
BUN: 14 mg/dL (ref 6–20)
CO2: 28 mmol/L (ref 22–32)
Calcium: 8.8 mg/dL — ABNORMAL LOW (ref 8.9–10.3)
Chloride: 103 mmol/L (ref 98–111)
Creatinine: 1.17 mg/dL (ref 0.61–1.24)
GFR, Estimated: >60 mL/min (ref >60)
Glucose, Bld: 105 mg/dL — ABNORMAL HIGH (ref 70–99)
Potassium: 3.9 mmol/L (ref 3.5–5.1)
Sodium: 138 mmol/L (ref 135–145)
Total Bilirubin: 0.3 mg/dL (ref 0.3–1.2)
Total Protein: 7.2 g/dL (ref 6.5–8.1)

## 2021-09-05 LAB — SAMPLE TO BLOOD BANK

## 2021-09-05 LAB — CBC WITH DIFFERENTIAL (CANCER CENTER ONLY)
Abs Immature Granulocytes: 0.01 10*3/uL (ref 0.00–0.07)
Basophils Absolute: 0 10*3/uL (ref 0.0–0.1)
Basophils Relative: 0 %
Eosinophils Absolute: 0 10*3/uL (ref 0.0–0.5)
Eosinophils Relative: 0 %
HCT: 30 % — ABNORMAL LOW (ref 39.0–52.0)
Hemoglobin: 9.9 g/dL — ABNORMAL LOW (ref 13.0–17.0)
Immature Granulocytes: 0 %
Lymphocytes Relative: 34 %
Lymphs Abs: 1.1 10*3/uL (ref 0.7–4.0)
MCH: 27.2 pg (ref 26.0–34.0)
MCHC: 33 g/dL (ref 30.0–36.0)
MCV: 82.4 fL (ref 80.0–100.0)
Monocytes Absolute: 0.7 10*3/uL (ref 0.1–1.0)
Monocytes Relative: 20 %
Neutro Abs: 1.6 10*3/uL — ABNORMAL LOW (ref 1.7–7.7)
Neutrophils Relative %: 46 %
Platelet Count: 119 10*3/uL — ABNORMAL LOW (ref 150–400)
RBC: 3.64 MIL/uL — ABNORMAL LOW (ref 4.22–5.81)
RDW: 18 % — ABNORMAL HIGH (ref 11.5–15.5)
WBC Count: 3.4 10*3/uL — ABNORMAL LOW (ref 4.0–10.5)
nRBC: 0 % (ref 0.0–0.2)

## 2021-09-05 MED ORDER — HEPARIN SOD (PORK) LOCK FLUSH 100 UNIT/ML IV SOLN
500.0000 [IU] | Freq: Once | INTRAVENOUS | Status: AC
  Administered 2021-09-05: 500 [IU]

## 2021-09-05 MED ORDER — SODIUM CHLORIDE 0.9% FLUSH
10.0000 mL | Freq: Once | INTRAVENOUS | Status: AC
  Administered 2021-09-05: 10 mL

## 2021-09-07 ENCOUNTER — Other Ambulatory Visit: Payer: Self-pay | Admitting: Physician Assistant

## 2021-09-07 DIAGNOSIS — C3491 Malignant neoplasm of unspecified part of right bronchus or lung: Secondary | ICD-10-CM

## 2021-09-07 MED ORDER — ALPRAZOLAM 0.25 MG PO TABS: 0.2500 mg | ORAL_TABLET | Freq: Two times a day (BID) | ORAL | 0 refills | Status: DC | PRN

## 2021-09-11 ENCOUNTER — Ambulatory Visit: Payer: Commercial Managed Care - PPO | Admitting: Internal Medicine

## 2021-09-11 ENCOUNTER — Ambulatory Visit: Payer: Commercial Managed Care - PPO

## 2021-09-11 MED FILL — Dexamethasone Sodium Phosphate Inj 100 MG/10ML: INTRAMUSCULAR | Qty: 1 | Status: AC

## 2021-09-11 MED FILL — Fosaprepitant Dimeglumine For IV Infusion 150 MG (Base Eq): INTRAVENOUS | Qty: 5 | Status: AC

## 2021-09-11 NOTE — Telephone Encounter (Signed)
[name] requesting a refill of Xanax.  Records indicate this was filled Friday 09/07/21. Pt acknowledges this and advised he picked it up that day. He is unaware why he left a message requesting this. Pt has appt tomorrow with Dr. Julien Nordmann and no further assistance was needed today.

## 2021-09-12 ENCOUNTER — Other Ambulatory Visit: Payer: Self-pay

## 2021-09-12 ENCOUNTER — Inpatient Hospital Stay: Payer: Commercial Managed Care - PPO

## 2021-09-12 ENCOUNTER — Encounter: Payer: Self-pay | Admitting: Internal Medicine

## 2021-09-12 ENCOUNTER — Inpatient Hospital Stay: Payer: Commercial Managed Care - PPO | Admitting: Dietician

## 2021-09-12 ENCOUNTER — Inpatient Hospital Stay (HOSPITAL_BASED_OUTPATIENT_CLINIC_OR_DEPARTMENT_OTHER): Payer: Commercial Managed Care - PPO | Admitting: Internal Medicine

## 2021-09-12 VITALS — HR 97

## 2021-09-12 VITALS — BP 126/86 | HR 109 | Temp 97.8°F | Resp 18 | Ht 72.0 in | Wt 172.8 lb

## 2021-09-12 DIAGNOSIS — Z5111 Encounter for antineoplastic chemotherapy: Secondary | ICD-10-CM | POA: Diagnosis not present

## 2021-09-12 DIAGNOSIS — C3491 Malignant neoplasm of unspecified part of right bronchus or lung: Secondary | ICD-10-CM

## 2021-09-12 DIAGNOSIS — C3490 Malignant neoplasm of unspecified part of unspecified bronchus or lung: Secondary | ICD-10-CM

## 2021-09-12 DIAGNOSIS — D6481 Anemia due to antineoplastic chemotherapy: Secondary | ICD-10-CM

## 2021-09-12 DIAGNOSIS — T451X5A Adverse effect of antineoplastic and immunosuppressive drugs, initial encounter: Secondary | ICD-10-CM

## 2021-09-12 DIAGNOSIS — Z95828 Presence of other vascular implants and grafts: Secondary | ICD-10-CM

## 2021-09-12 DIAGNOSIS — Z5112 Encounter for antineoplastic immunotherapy: Secondary | ICD-10-CM | POA: Diagnosis not present

## 2021-09-12 DIAGNOSIS — C3432 Malignant neoplasm of lower lobe, left bronchus or lung: Secondary | ICD-10-CM | POA: Diagnosis not present

## 2021-09-12 LAB — CBC WITH DIFFERENTIAL (CANCER CENTER ONLY)
Abs Immature Granulocytes: 0.41 10*3/uL — ABNORMAL HIGH (ref 0.00–0.07)
Basophils Absolute: 0 10*3/uL (ref 0.0–0.1)
Basophils Relative: 1 %
Eosinophils Absolute: 0.1 10*3/uL (ref 0.0–0.5)
Eosinophils Relative: 1 %
HCT: 31 % — ABNORMAL LOW (ref 39.0–52.0)
Hemoglobin: 10.1 g/dL — ABNORMAL LOW (ref 13.0–17.0)
Immature Granulocytes: 5 %
Lymphocytes Relative: 21 %
Lymphs Abs: 1.6 10*3/uL (ref 0.7–4.0)
MCH: 26.9 pg (ref 26.0–34.0)
MCHC: 32.6 g/dL (ref 30.0–36.0)
MCV: 82.4 fL (ref 80.0–100.0)
Monocytes Absolute: 1.4 10*3/uL — ABNORMAL HIGH (ref 0.1–1.0)
Monocytes Relative: 19 %
Neutro Abs: 4 10*3/uL (ref 1.7–7.7)
Neutrophils Relative %: 53 %
Platelet Count: 251 10*3/uL (ref 150–400)
RBC: 3.76 MIL/uL — ABNORMAL LOW (ref 4.22–5.81)
RDW: 19.7 % — ABNORMAL HIGH (ref 11.5–15.5)
WBC Count: 7.6 10*3/uL (ref 4.0–10.5)
nRBC: 0 % (ref 0.0–0.2)

## 2021-09-12 LAB — CMP (CANCER CENTER ONLY)
ALT: 29 U/L (ref 0–44)
AST: 31 U/L (ref 15–41)
Albumin: 3.5 g/dL (ref 3.5–5.0)
Alkaline Phosphatase: 182 U/L — ABNORMAL HIGH (ref 38–126)
Anion gap: 8 (ref 5–15)
BUN: 16 mg/dL (ref 6–20)
CO2: 27 mmol/L (ref 22–32)
Calcium: 9.4 mg/dL (ref 8.9–10.3)
Chloride: 100 mmol/L (ref 98–111)
Creatinine: 1.22 mg/dL (ref 0.61–1.24)
GFR, Estimated: >60 mL/min (ref >60)
Glucose, Bld: 130 mg/dL — ABNORMAL HIGH (ref 70–99)
Potassium: 3.8 mmol/L (ref 3.5–5.1)
Sodium: 135 mmol/L (ref 135–145)
Total Bilirubin: 0.4 mg/dL (ref 0.3–1.2)
Total Protein: 7.5 g/dL (ref 6.5–8.1)

## 2021-09-12 LAB — TSH: TSH: 2.522 u[IU]/mL (ref 0.350–4.500)

## 2021-09-12 LAB — SAMPLE TO BLOOD BANK

## 2021-09-12 MED ORDER — PALONOSETRON HCL INJECTION 0.25 MG/5ML
0.2500 mg | Freq: Once | INTRAVENOUS | Status: AC
  Administered 2021-09-12: 0.25 mg via INTRAVENOUS
  Filled 2021-09-12: qty 5

## 2021-09-12 MED ORDER — SODIUM CHLORIDE 0.9% FLUSH
10.0000 mL | Freq: Once | INTRAVENOUS | Status: AC
  Administered 2021-09-12: 10 mL

## 2021-09-12 MED ORDER — SODIUM CHLORIDE 0.9 % IV SOLN
551.0000 mg | Freq: Once | INTRAVENOUS | Status: AC
  Administered 2021-09-12: 550 mg via INTRAVENOUS
  Filled 2021-09-12: qty 55

## 2021-09-12 MED ORDER — SODIUM CHLORIDE 0.9 % IV SOLN
200.0000 mg | Freq: Once | INTRAVENOUS | Status: AC
  Administered 2021-09-12: 200 mg via INTRAVENOUS
  Filled 2021-09-12: qty 200

## 2021-09-12 MED ORDER — HEPARIN SOD (PORK) LOCK FLUSH 100 UNIT/ML IV SOLN
500.0000 [IU] | Freq: Once | INTRAVENOUS | Status: AC | PRN
  Administered 2021-09-12: 500 [IU]

## 2021-09-12 MED ORDER — SODIUM CHLORIDE 0.9 % IV SOLN
10.0000 mg | Freq: Once | INTRAVENOUS | Status: AC
  Administered 2021-09-12: 10 mg via INTRAVENOUS
  Filled 2021-09-12: qty 10

## 2021-09-12 MED ORDER — CYANOCOBALAMIN 1000 MCG/ML IJ SOLN
1000.0000 ug | Freq: Once | INTRAMUSCULAR | Status: AC
  Administered 2021-09-12: 1000 ug via INTRAMUSCULAR
  Filled 2021-09-12: qty 1

## 2021-09-12 MED ORDER — SODIUM CHLORIDE 0.9% FLUSH
10.0000 mL | INTRAVENOUS | Status: DC | PRN
  Administered 2021-09-12: 10 mL

## 2021-09-12 MED ORDER — SODIUM CHLORIDE 0.9 % IV SOLN: Freq: Once | INTRAVENOUS | Status: AC

## 2021-09-12 MED ORDER — SODIUM CHLORIDE 0.9 % IV SOLN
150.0000 mg | Freq: Once | INTRAVENOUS | Status: AC
  Administered 2021-09-12: 150 mg via INTRAVENOUS
  Filled 2021-09-12: qty 150

## 2021-09-12 MED ORDER — SODIUM CHLORIDE 0.9 % IV SOLN
400.0000 mg/m2 | Freq: Once | INTRAVENOUS | Status: AC
  Administered 2021-09-12: 800 mg via INTRAVENOUS
  Filled 2021-09-12: qty 20

## 2021-09-12 NOTE — Patient Instructions (Signed)
[facility]  Discharge Instructions: Thank you for choosing [facility] to provide your oncology and hematology care.   If you have a lab appointment with the [facility], please go directly to the [facility] and check in at the registration area.   Wear comfortable clothing and clothing appropriate for easy access to any Portacath or PICC line.   We strive to give you quality time with your provider. You may need to reschedule your appointment if you arrive late (15 or more minutes).  Arriving late affects you and other patients whose appointments are after yours.  Also, if you miss three or more appointments without notifying the office, you may be dismissed from the clinic at the provider's discretion.      For prescription refill requests, have your pharmacy contact our office and allow 72 hours for refills to be completed.    Today you received the following chemotherapy and/or immunotherapy agents: Keytruda, Alimta, Carboplatin      To help prevent nausea and vomiting after your treatment, we encourage you to take your nausea medication as directed.  BELOW ARE SYMPTOMS THAT SHOULD BE REPORTED IMMEDIATELY: *FEVER GREATER THAN 100.4 F (38 C) OR HIGHER *CHILLS OR SWEATING *NAUSEA AND VOMITING THAT IS NOT CONTROLLED WITH YOUR NAUSEA MEDICATION *UNUSUAL SHORTNESS OF BREATH *UNUSUAL BRUISING OR BLEEDING *URINARY PROBLEMS (pain or burning when urinating, or frequent urination) *BOWEL PROBLEMS (unusual diarrhea, constipation, pain near the anus) TENDERNESS IN MOUTH AND THROAT WITH OR WITHOUT PRESENCE OF ULCERS (sore throat, sores in mouth, or a toothache) UNUSUAL RASH, SWELLING OR PAIN  UNUSUAL VAGINAL DISCHARGE OR ITCHING   Items with * indicate a potential emergency and should be followed up as soon as possible or go to the Emergency Department if any problems should occur.  Please show the CHEMOTHERAPY ALERT CARD or IMMUNOTHERAPY ALERT  CARD at check-in to the Emergency Department and triage nurse.  Should you have questions after your visit or need to cancel or reschedule your appointment, please contact [facility]  Dept: [phone]  and follow the prompts.  Office hours are 8:00 a.m. to 4:30 p.m. Monday - Friday. Please note that voicemails left after 4:00 p.m. may not be returned until the following business day.  We are closed weekends and major holidays. You have access to a nurse at all times for urgent questions. Please call the main number to the clinic Dept: [phone] and follow the prompts.   For any non-urgent questions, you may also contact your provider using MyChart. We now offer e-Visits for anyone 37 and older to request care online for non-urgent symptoms. For details visit [email or web address].   Also download the MyChart app! Go to the app store, search "MyChart", open the app, select [facility], and log in with your MyChart username and password.  Due to Covid, a mask is required upon entering the hospital/clinic. If you do not have a mask, one will be given to you upon arrival. For doctor visits, patients may have 1 support person aged 61 or older with them. For treatment visits, patients cannot have anyone with them due to current Covid guidelines and our immunocompromised population.

## 2021-09-12 NOTE — Progress Notes (Signed)
Per Dr Julien Nordmann it isok to treat pt today with Carboplatin , alimta and keytruda and pulse of 109.

## 2021-09-12 NOTE — Progress Notes (Signed)
[name] Potts [phone]   [phone]  OFFICE PROGRESS NOTE  [name], MD [place] [address]  DIAGNOSIS: Stage IV (T1b, N2, M1 a) non-small cell lung cancer, adenocarcinoma presented with right lower lobe lung nodule in addition to right paratracheal and subcarinal lymphadenopathy and malignant right pleural effusion as well as pleural metastatic disease diagnosed in March 2023.  His molecular studies showed no actionable mutation and PD-L1 expression was 1%. It showed KRAS G12D, Myc amplification, CCND3 amplification, TP53 R248L, VEGFA amplification but these are not actionable at this time.   PRIOR THERAPY:None   CURRENT THERAPY: carboplatin for AUC of 5, Alimta 500 Mg/M2 and Keytruda 200 Mg IV every 3 weeks. First dose on 08/01/21. Status post 2 cycles. Alimta dose reduced to 400 mg/m2 starting from cycle #2.   INTERVAL HISTORY: [name] Potts 51 y.o. male returns to the clinic today for follow-up visit accompanied by his wife.  The patient is feeling much better today.  He tolerated the second cycle of his chemotherapy much better except for fatigue few days after the treatment.  He denied having any current chest pain, shortness of breath, cough or hemoptysis.  He has no nausea, vomiting, diarrhea but has occasional constipation.  He has mild neuropathy especially in the thumb of the right hand.  He has previous cervical spine surgery.  The patient denied having any recent weight loss or night sweats.  He has no headache or visual changes.  He is here today for evaluation before starting cycle #3 of his treatment.  MEDICAL HISTORY: Past Medical History:  Diagnosis Date   ADHD (attention deficit hyperactivity disorder)    Chronic back pain    Complication of anesthesia    woke up during 1 of 3 colonoscopies   Constipation    Dyspnea    Pneumonia    Seasonal allergies     ALLERGIES:  has No Known  Allergies.  MEDICATIONS:  Current Outpatient Medications  Medication Sig Dispense Refill   ALPRAZolam (XANAX) 0.25 MG tablet Take 1 tablet (0.25 mg total) by mouth 2 (two) times daily as needed for anxiety. 30 tablet 0   apixaban (ELIQUIS) 5 MG TABS tablet Take 2 tablets (31m) twice daily for 7 days, then 1 tablet (539m twice daily (Patient taking differently: Take 5 mg by mouth 2 (two) times daily.) 60 tablet 3   atorvastatin (LIPITOR) 40 MG tablet Take 1 tablet (40 mg total) by mouth daily. 90 tablet 1   FeFum-FePoly-FA-B Cmp-C-Biot (INTEGRA PLUS) CAPS Take 1 capsule by mouth daily. 30 capsule 3   folic acid (FOLVITE) 1 MG tablet Take 1 tablet (1 mg total) by mouth daily. 30 tablet 4   lidocaine-prilocaine (EMLA) cream Apply to the Port-A-Cath site 30 minutes before chemotherapy 30 g 0   mirtazapine (REMERON) 30 MG tablet Take 1 tablet (30 mg total) by mouth at bedtime. 30 tablet 2   morphine (MS CONTIN) 15 MG 12 hr tablet Take 1 tablet (15 mg total) by mouth every 12 (twelve) hours. 60 tablet 0   oxyCODONE (OXY IR/ROXICODONE) 5 MG immediate release tablet Take 1 tablet (5 mg total) by mouth every 6 (six) hours as needed for moderate pain or severe pain. 30 tablet 0   polyethylene glycol (MIRALAX / GLYCOLAX) 17 g packet Take 17 g by mouth daily as needed for mild constipation. 14 each 0   prochlorperazine (COMPAZINE) 10 MG tablet TAKE 1 TABLET BY MOUTH  EVERY 6 HOURS AS NEEDED FOR NAUSEA FOR VOMITING 30 tablet 2   No current facility-administered medications for this visit.    SURGICAL HISTORY:  Past Surgical History:  Procedure Laterality Date   COLONOSCOPY  10/11/2011   Procedure: COLONOSCOPY;  Surgeon: Daneil Dolin, MD;  [facility];  Service: Endoscopy;  Laterality: N/A;  1:45 PM   COLONOSCOPY N/A 01/23/2018   Procedure: COLONOSCOPY;  Surgeon: Daneil Dolin, MD;  [facility];  Service: Endoscopy;  Laterality: N/A;  1:00   IR IMAGING GUIDED PORT INSERTION   07/27/2021   NECK SURGERY     Apr 05, 2021- per pt report cervical surgery at day surgery center   POLYPECTOMY  01/23/2018   Procedure: POLYPECTOMY;  Surgeon: Daneil Dolin, MD;  Location: [facility];  Service: Endoscopy;;   VIDEO ASSISTED THORACOSCOPY (VATS)/DECORTICATION Right 06/27/2021   Procedure: VIDEO ASSISTED THORACOSCOPY (VATS), Drainage of RIGHT pleural effusion/ DECORTICATION;  Surgeon: Melrose Nakayama, MD;  Location: [facility];  Service: Thoracic;  Laterality: Right;    REVIEW OF SYSTEMS:  Constitutional: positive for fatigue Eyes: negative Ears, nose, mouth, throat, and face: negative Respiratory: negative Cardiovascular: negative Gastrointestinal: positive for constipation Genitourinary:negative Integument/breast: negative Hematologic/lymphatic: negative Musculoskeletal:negative Neurological: positive for paresthesia Behavioral/Psych: negative Endocrine: negative Allergic/Immunologic: negative   PHYSICAL EXAMINATION: General appearance: alert, cooperative, fatigued, and no distress Head: Normocephalic, without obvious abnormality, atraumatic Neck: no adenopathy, no JVD, supple, symmetrical, trachea midline, and thyroid not enlarged, symmetric, no tenderness/mass/nodules Lymph nodes: Cervical, supraclavicular, and axillary nodes normal. Resp: clear to auscultation bilaterally Back: symmetric, no curvature. ROM normal. No CVA tenderness. Cardio: regular rate and rhythm, S1, S2 normal, no murmur, click, rub or gallop GI: soft, non-tender; bowel sounds normal; no masses,  no organomegaly Extremities: extremities normal, atraumatic, no cyanosis or edema Neurologic: Alert and oriented X 3, normal strength and tone. Normal symmetric reflexes. Normal coordination and gait  ECOG PERFORMANCE STATUS: 1 - Symptomatic but completely ambulatory  There were no vitals taken for this visit.  LABORATORY DATA: Lab Results  Component Value Date   WBC 3.4 (L) 09/05/2021   HGB  9.9 (L) 09/05/2021   HCT 30.0 (L) 09/05/2021   MCV 82.4 09/05/2021   PLT 119 (L) 09/05/2021      Chemistry      Component Value Date/Time   NA 138 09/05/2021 1321   NA 140 11/22/2020 1644   K 3.9 09/05/2021 1321   CL 103 09/05/2021 1321   CO2 28 09/05/2021 1321   BUN 14 09/05/2021 1321   BUN 13 11/22/2020 1644   CREATININE 1.17 09/05/2021 1321      Component Value Date/Time   CALCIUM 8.8 (L) 09/05/2021 1321   ALKPHOS 177 (H) 09/05/2021 1321   AST 32 09/05/2021 1321   ALT 35 09/05/2021 1321   BILITOT 0.3 09/05/2021 1321       RADIOGRAPHIC STUDIES: DG Chest 2 View  Result Date: 08/28/2021 CLINICAL DATA:  Adenocarcinoma of the right lung EXAM: CHEST - 2 VIEW COMPARISON:  08/05/2021 FINDINGS: Left chest port with catheter tip at the distal SVC. Normal cardiac and mediastinal contours. Redemonstrated right hydropneumothorax, which appears unchanged from prior exam. The left lung is clear. No left pleural effusion or pneumothorax. No acute osseous abnormality. IMPRESSION: Persistent right hydropneumothorax, which appears similar in size when compared to 08/05/2021. Electronically Signed   By: Merilyn Baba M.D.   On: 08/28/2021 13:40    ASSESSMENT AND PLAN: This is a very  pleasant 51 years old white male with stage IV (T1b, N2, M1 a) non-small cell lung cancer, adenocarcinoma presented with right lower lobe lung nodule in addition to right paratracheal and subcarinal lymphadenopathy and malignant right pleural effusion as well as pleural metastatic disease diagnosed in March 2023. The patient has no actionable mutations and PD-L1 expression was 1%. He is currently undergoing systemic chemotherapy with carboplatin for AUC of 5, Alimta 400 Mg/M2 and Keytruda 200 Mg IV every 3 weeks.  Status post 2 cycles.  His dose of Alimta was reduced to 400 Mg/M2 starting from cycle #2 secondary to renal insufficiency and intolerance. The patient tolerated the second cycle of his treatment much better  except for the persistent fatigue and intermittent pain. I recommended for him to proceed with cycle #3 today as planned. I will see him back for follow-up visit in 3 weeks for evaluation with repeat CT scan of the chest for restaging of his disease. For the pain management, he is currently on MS Contin 15 mg p.o. every 12 hours in addition to oxycodone for breakthrough pain management. For the anxiety, the patient is currently on Xanax 0.25 mg p.o. nightly. The patient and his wife had several questions about his condition and whether he would be able to go back to work at some point.  I explained to them that we will have to monitor his condition closely and make a decision in the future depending on the scan results and his general condition. He is also trying to use some CBD oil and marijuana derivatives.  I explained to the patient that I cannot give him any advice about continuing with these medication for now.  It may help his appetite but it is illegal in [place]. The patient was advised to call immediately if he has any other concerning symptoms in the interval. The patient voices understanding of current disease status and treatment options and is in agreement with the current care plan.  All questions were answered. The patient knows to call the clinic with any problems, questions or concerns. We can certainly see the patient much sooner if necessary.  The total time spent in the appointment was 40 minutes.  Disclaimer: This note was dictated with voice recognition software. Similar sounding words can inadvertently be transcribed and may not be corrected upon review.

## 2021-09-12 NOTE — Progress Notes (Signed)
Nutrition Assessment   Reason for Assessment: Hospital f/u   ASSESSMENT: 51 year old male with stage IV adenocarcinoma of right lung with right paratracheal and subcarinal lymphadenopathy, malignant right pleural effusion as well as pleural static disease diagnosed 06/2021. Patient is under the care of Dr. Julien Nordmann  CURRENT THERAPY: carboplatin for AUC of 5, Alimta 500 Mg/M2 and Keytruda 200 Mg IV every 3 weeks. First dose on 08/01/21. Alimta dose reduced to 400 mg/m2 starting from cycle #2.   Past medical history includes DVT of RLE, pleural effusion on right, hydropneumothorax, ADHD, Covid-19 infection, anxiety -hospital admission 4/23-4/25 with AKI, dehydration, hyponatremia  Met with patient and wife in infusion. Patient reports appetite has improved and drinking plenty of fluids. Patient reports tolerating cycle 2 significantly better. He recalls day 3 following treatment as his worst day. Patient stays in bed with fatigue, nausea, body aches. He drinks water and gatorade. Typically patient eating 3 meals as well as daily oral nutrition. Patient is drinking one carnation breakfast essential or protein shakes made with fresh fruit that his daughter prepares. Patient denies diarrhea, constipation, vomiting, nausea.    Nutrition Focused Physical Exam: deferred  Medications: xanax, eliquis, folvite, remeron, ms contin, roxicodone, miralax, compazine, integra   Labs: glucose 130   Anthropometrics: Weights have decreased 6.5% from usual weight in 12 weeks; insignificant for time frame however concerning  Height: 6' Weight: 172 lb 12.8 oz  UBW: 184 lb (06/21/21) BMI: 23.44 lb    NUTRITION DIAGNOSIS: Unintended weight loss related to cancer and associated treatment side effects as evidenced by 6.5% wt loss in 12 weeks   INTERVENTION:  Encouraged small frequent meals and snacks with adequate calories and protein - handout with snack ideas, soft moist high protein foods, and shake recipes  provided Discussed tips for nausea, foods to limit/foods to avoid - handout with tips provided Recommend 1-2 Ensure Plus/equivalent daily - coupons provided  Contact information provided    MONITORING, EVALUATION, GOAL: Patient will tolerate increased calories and protein to minimize further weight loss   Next Visit: To be scheduled as needed

## 2021-09-17 ENCOUNTER — Telehealth: Payer: Self-pay | Admitting: Medical Oncology

## 2021-09-17 ENCOUNTER — Other Ambulatory Visit: Payer: Commercial Managed Care - PPO

## 2021-09-17 NOTE — Telephone Encounter (Signed)
Refill for MS Contin- States he has " two pills left.  I LVM for pt and told him he should not be out of pills and at last visit he told me he is not taking any.

## 2021-09-18 ENCOUNTER — Telehealth: Payer: Self-pay | Admitting: Medical Oncology

## 2021-09-18 ENCOUNTER — Other Ambulatory Visit: Payer: Self-pay | Admitting: Internal Medicine

## 2021-09-18 MED ORDER — MORPHINE SULFATE ER 15 MG PO TBCR
15.0000 mg | EXTENDED_RELEASE_TABLET | Freq: Two times a day (BID) | ORAL | 0 refills | Status: DC
Start: 2021-09-18 — End: 2021-10-12

## 2021-09-18 NOTE — Telephone Encounter (Signed)
Requests refill for MSContin.

## 2021-09-19 ENCOUNTER — Inpatient Hospital Stay: Payer: Commercial Managed Care - PPO

## 2021-09-21 ENCOUNTER — Other Ambulatory Visit: Payer: Self-pay

## 2021-09-21 ENCOUNTER — Inpatient Hospital Stay: Payer: Commercial Managed Care - PPO | Attending: Internal Medicine

## 2021-09-21 DIAGNOSIS — J948 Other specified pleural conditions: Secondary | ICD-10-CM | POA: Insufficient documentation

## 2021-09-21 DIAGNOSIS — D61818 Other pancytopenia: Secondary | ICD-10-CM | POA: Diagnosis not present

## 2021-09-21 DIAGNOSIS — M47817 Spondylosis without myelopathy or radiculopathy, lumbosacral region: Secondary | ICD-10-CM | POA: Insufficient documentation

## 2021-09-21 DIAGNOSIS — T451X5A Adverse effect of antineoplastic and immunosuppressive drugs, initial encounter: Secondary | ICD-10-CM | POA: Diagnosis not present

## 2021-09-21 DIAGNOSIS — Z7901 Long term (current) use of anticoagulants: Secondary | ICD-10-CM | POA: Diagnosis not present

## 2021-09-21 DIAGNOSIS — C3491 Malignant neoplasm of unspecified part of right bronchus or lung: Secondary | ICD-10-CM

## 2021-09-21 DIAGNOSIS — Z9221 Personal history of antineoplastic chemotherapy: Secondary | ICD-10-CM | POA: Diagnosis not present

## 2021-09-21 DIAGNOSIS — G8929 Other chronic pain: Secondary | ICD-10-CM | POA: Diagnosis not present

## 2021-09-21 DIAGNOSIS — D6481 Anemia due to antineoplastic chemotherapy: Secondary | ICD-10-CM | POA: Diagnosis not present

## 2021-09-21 DIAGNOSIS — J432 Centrilobular emphysema: Secondary | ICD-10-CM | POA: Diagnosis not present

## 2021-09-21 DIAGNOSIS — S5012XA Contusion of left forearm, initial encounter: Secondary | ICD-10-CM | POA: Insufficient documentation

## 2021-09-21 DIAGNOSIS — Z87891 Personal history of nicotine dependence: Secondary | ICD-10-CM | POA: Diagnosis not present

## 2021-09-21 DIAGNOSIS — Z800 Family history of malignant neoplasm of digestive organs: Secondary | ICD-10-CM | POA: Diagnosis not present

## 2021-09-21 DIAGNOSIS — C3431 Malignant neoplasm of lower lobe, right bronchus or lung: Secondary | ICD-10-CM | POA: Diagnosis not present

## 2021-09-21 DIAGNOSIS — R233 Spontaneous ecchymoses: Secondary | ICD-10-CM | POA: Diagnosis not present

## 2021-09-21 DIAGNOSIS — K5900 Constipation, unspecified: Secondary | ICD-10-CM | POA: Insufficient documentation

## 2021-09-21 DIAGNOSIS — J910 Malignant pleural effusion: Secondary | ICD-10-CM | POA: Diagnosis not present

## 2021-09-21 DIAGNOSIS — I700 Atherosclerosis of aorta: Secondary | ICD-10-CM | POA: Diagnosis not present

## 2021-09-21 DIAGNOSIS — C3432 Malignant neoplasm of lower lobe, left bronchus or lung: Secondary | ICD-10-CM | POA: Insufficient documentation

## 2021-09-21 DIAGNOSIS — R21 Rash and other nonspecific skin eruption: Secondary | ICD-10-CM | POA: Insufficient documentation

## 2021-09-21 DIAGNOSIS — R55 Syncope and collapse: Secondary | ICD-10-CM | POA: Insufficient documentation

## 2021-09-21 DIAGNOSIS — Z79899 Other long term (current) drug therapy: Secondary | ICD-10-CM | POA: Insufficient documentation

## 2021-09-21 DIAGNOSIS — Z95828 Presence of other vascular implants and grafts: Secondary | ICD-10-CM

## 2021-09-21 LAB — CMP (CANCER CENTER ONLY)
ALT: 31 U/L (ref 0–44)
AST: 49 U/L — ABNORMAL HIGH (ref 15–41)
Albumin: 3.4 g/dL — ABNORMAL LOW (ref 3.5–5.0)
Alkaline Phosphatase: 189 U/L — ABNORMAL HIGH (ref 38–126)
Anion gap: 8 (ref 5–15)
BUN: 21 mg/dL — ABNORMAL HIGH (ref 6–20)
CO2: 29 mmol/L (ref 22–32)
Calcium: 9.3 mg/dL (ref 8.9–10.3)
Chloride: 98 mmol/L (ref 98–111)
Creatinine: 1.19 mg/dL (ref 0.61–1.24)
GFR, Estimated: >60 mL/min (ref >60)
Glucose, Bld: 113 mg/dL — ABNORMAL HIGH (ref 70–99)
Potassium: 4 mmol/L (ref 3.5–5.1)
Sodium: 135 mmol/L (ref 135–145)
Total Bilirubin: 0.5 mg/dL (ref 0.3–1.2)
Total Protein: 7 g/dL (ref 6.5–8.1)

## 2021-09-21 LAB — CBC WITH DIFFERENTIAL (CANCER CENTER ONLY)
Abs Immature Granulocytes: 0.02 10*3/uL (ref 0.00–0.07)
Basophils Absolute: 0 10*3/uL (ref 0.0–0.1)
Basophils Relative: 1 %
Eosinophils Absolute: 0 10*3/uL (ref 0.0–0.5)
Eosinophils Relative: 0 %
HCT: 24.2 % — ABNORMAL LOW (ref 39.0–52.0)
Hemoglobin: 8.2 g/dL — ABNORMAL LOW (ref 13.0–17.0)
Immature Granulocytes: 1 %
Lymphocytes Relative: 24 %
Lymphs Abs: 1 10*3/uL (ref 0.7–4.0)
MCH: 27.5 pg (ref 26.0–34.0)
MCHC: 33.9 g/dL (ref 30.0–36.0)
MCV: 81.2 fL (ref 80.0–100.0)
Monocytes Absolute: 0.2 10*3/uL (ref 0.1–1.0)
Monocytes Relative: 5 %
Neutro Abs: 2.9 10*3/uL (ref 1.7–7.7)
Neutrophils Relative %: 69 %
Platelet Count: 17 10*3/uL — ABNORMAL LOW (ref 150–400)
RBC: 2.98 MIL/uL — ABNORMAL LOW (ref 4.22–5.81)
RDW: 19.2 % — ABNORMAL HIGH (ref 11.5–15.5)
WBC Count: 4.2 10*3/uL (ref 4.0–10.5)
nRBC: 0 % (ref 0.0–0.2)

## 2021-09-21 LAB — SAMPLE TO BLOOD BANK

## 2021-09-21 MED ORDER — HEPARIN SOD (PORK) LOCK FLUSH 100 UNIT/ML IV SOLN
500.0000 [IU] | Freq: Once | INTRAVENOUS | Status: AC
  Administered 2021-09-21: 500 [IU]

## 2021-09-21 MED ORDER — SODIUM CHLORIDE 0.9% FLUSH
10.0000 mL | Freq: Once | INTRAVENOUS | Status: AC
  Administered 2021-09-21: 10 mL

## 2021-09-24 ENCOUNTER — Other Ambulatory Visit: Payer: Commercial Managed Care - PPO

## 2021-09-26 ENCOUNTER — Other Ambulatory Visit: Payer: Self-pay | Admitting: Physician Assistant

## 2021-09-26 ENCOUNTER — Telehealth: Payer: Self-pay | Admitting: Medical Oncology

## 2021-09-26 ENCOUNTER — Inpatient Hospital Stay: Payer: Commercial Managed Care - PPO

## 2021-09-26 ENCOUNTER — Other Ambulatory Visit: Payer: Self-pay | Admitting: Medical Oncology

## 2021-09-26 ENCOUNTER — Other Ambulatory Visit: Payer: Self-pay

## 2021-09-26 DIAGNOSIS — D6481 Anemia due to antineoplastic chemotherapy: Secondary | ICD-10-CM

## 2021-09-26 DIAGNOSIS — C3491 Malignant neoplasm of unspecified part of right bronchus or lung: Secondary | ICD-10-CM

## 2021-09-26 DIAGNOSIS — R21 Rash and other nonspecific skin eruption: Secondary | ICD-10-CM | POA: Diagnosis not present

## 2021-09-26 DIAGNOSIS — Z95828 Presence of other vascular implants and grafts: Secondary | ICD-10-CM

## 2021-09-26 LAB — CBC WITH DIFFERENTIAL (CANCER CENTER ONLY)
Abs Immature Granulocytes: 0 10*3/uL (ref 0.00–0.07)
Basophils Absolute: 0 10*3/uL (ref 0.0–0.1)
Basophils Relative: 0 %
Eosinophils Absolute: 0 10*3/uL (ref 0.0–0.5)
Eosinophils Relative: 0 %
HCT: 21.8 % — ABNORMAL LOW (ref 39.0–52.0)
Hemoglobin: 7.3 g/dL — ABNORMAL LOW (ref 13.0–17.0)
Immature Granulocytes: 0 %
Lymphocytes Relative: 61 %
Lymphs Abs: 1.1 10*3/uL (ref 0.7–4.0)
MCH: 26.7 pg (ref 26.0–34.0)
MCHC: 33.5 g/dL (ref 30.0–36.0)
MCV: 79.9 fL — ABNORMAL LOW (ref 80.0–100.0)
Monocytes Absolute: 0.3 10*3/uL (ref 0.1–1.0)
Monocytes Relative: 14 %
Neutro Abs: 0.5 10*3/uL — ABNORMAL LOW (ref 1.7–7.7)
Neutrophils Relative %: 25 %
Platelet Count: 7 10*3/uL — CL (ref 150–400)
RBC: 2.73 MIL/uL — ABNORMAL LOW (ref 4.22–5.81)
RDW: 19.1 % — ABNORMAL HIGH (ref 11.5–15.5)
WBC Count: 1.8 10*3/uL — ABNORMAL LOW (ref 4.0–10.5)
nRBC: 0 % (ref 0.0–0.2)

## 2021-09-26 LAB — CMP (CANCER CENTER ONLY)
ALT: 23 U/L (ref 0–44)
AST: 35 U/L (ref 15–41)
Albumin: 3.2 g/dL — ABNORMAL LOW (ref 3.5–5.0)
Alkaline Phosphatase: 170 U/L — ABNORMAL HIGH (ref 38–126)
Anion gap: 10 (ref 5–15)
BUN: 19 mg/dL (ref 6–20)
CO2: 25 mmol/L (ref 22–32)
Calcium: 9 mg/dL (ref 8.9–10.3)
Chloride: 102 mmol/L (ref 98–111)
Creatinine: 1.12 mg/dL (ref 0.61–1.24)
GFR, Estimated: >60 mL/min (ref >60)
Glucose, Bld: 103 mg/dL — ABNORMAL HIGH (ref 70–99)
Potassium: 3.8 mmol/L (ref 3.5–5.1)
Sodium: 137 mmol/L (ref 135–145)
Total Bilirubin: 0.7 mg/dL (ref 0.3–1.2)
Total Protein: 7.4 g/dL (ref 6.5–8.1)

## 2021-09-26 MED ORDER — HEPARIN SOD (PORK) LOCK FLUSH 100 UNIT/ML IV SOLN
500.0000 [IU] | Freq: Every day | INTRAVENOUS | Status: AC | PRN
  Administered 2021-09-26: 500 [IU]

## 2021-09-26 MED ORDER — HEPARIN SOD (PORK) LOCK FLUSH 100 UNIT/ML IV SOLN
500.0000 [IU] | Freq: Once | INTRAVENOUS | Status: AC
  Administered 2021-09-26: 500 [IU]

## 2021-09-26 MED ORDER — SODIUM CHLORIDE 0.9% FLUSH
10.0000 mL | Freq: Once | INTRAVENOUS | Status: AC
  Administered 2021-09-26: 10 mL

## 2021-09-26 MED ORDER — ACETAMINOPHEN 325 MG PO TABS
650.0000 mg | ORAL_TABLET | Freq: Once | ORAL | Status: AC
  Administered 2021-09-26: 650 mg via ORAL
  Filled 2021-09-26: qty 2

## 2021-09-26 MED ORDER — SODIUM CHLORIDE 0.9% FLUSH
10.0000 mL | INTRAVENOUS | Status: AC | PRN
  Administered 2021-09-26: 10 mL

## 2021-09-26 MED ORDER — SODIUM CHLORIDE 0.9% IV SOLUTION
250.0000 mL | Freq: Once | INTRAVENOUS | Status: AC
  Administered 2021-09-26: 250 mL via INTRAVENOUS

## 2021-09-26 MED ORDER — ALPRAZOLAM 0.25 MG PO TABS: 0.2500 mg | ORAL_TABLET | Freq: Every day | ORAL | 0 refills | Status: DC | PRN

## 2021-09-26 MED ORDER — DIPHENHYDRAMINE HCL 25 MG PO CAPS
25.0000 mg | ORAL_CAPSULE | Freq: Once | ORAL | Status: AC
  Administered 2021-09-26: 25 mg via ORAL
  Filled 2021-09-26: qty 1

## 2021-09-26 NOTE — Progress Notes (Signed)
Pt scheduled for platelet transfusion.

## 2021-09-26 NOTE — Telephone Encounter (Signed)
CRITICAL VALUE STICKER  CRITICAL VALUE: Platelets 7k  RECEIVER (on-site recipient of call): [name] Potts  DATE & TIME NOTIFIED: 09/26/2021  MESSENGER (representative from lab): lab  MD NOTIFIED: Mohamed  TIME OF NOTIFICATION: 09/26/21 1409  RESPONSE:  Platelets ordered. Pt will also get 2 units of blood this week.  Spoke to pt and wife that he needs to come back for platelets and possible blood transfusion.

## 2021-09-26 NOTE — Progress Notes (Signed)
Order's released

## 2021-09-26 NOTE — Progress Notes (Signed)
Blood transfusion orders entered.

## 2021-09-26 NOTE — Patient Instructions (Signed)
Platelet Transfusion A platelet transfusion is a procedure in which a person receives donated platelets through an IV. Platelets are parts of blood that stick together and form a clot to help the body stop bleeding after an injury. If you have too few platelets, your blood may have trouble clotting. This may cause you to bleed and bruise very easily. You may need a platelet transfusion if you have a condition that causes a low number of platelets (thrombocytopenia). A platelet transfusion may be used to stop or prevent excessive bleeding. Tell a health care provider about: Any reactions you have had during previous transfusions. Any allergies you have. All medicines you are taking, including vitamins, herbs, eye drops, creams, and over-the-counter medicines. Any bleeding problems you have. Any surgeries you have had. Any medical conditions you have. Whether you are pregnant or may be pregnant. What are the risks? Generally, this is a safe procedure. However, problems may occur, including: Fever. Infection. Allergic reaction to the donated (donor) platelets. Your body's disease-fighting system (immune system) attacking the donor platelets (hemolytic reaction). This is rare. A rare reaction that causes lung damage (transfusion-related acute lung injury). What happens before the procedure? Medicines Ask your health care provider about: Changing or stopping your regular medicines. This is especially important if you are taking diabetes medicines or blood thinners. Taking medicines such as aspirin and ibuprofen. These medicines can thin your blood. Do not take these medicines unless your health care provider tells you to take them. Taking over-the-counter medicines, vitamins, herbs, and supplements. General instructions You will have a blood test to determine your blood type. Your blood type determines what kind of platelets you will be given. Follow instructions from your health care provider  about eating or drinking restrictions. If you have had an allergic reaction to a transfusion in the past, you may be given medicine to help prevent a reaction. Your temperature, blood pressure, pulse, and breathing will be monitored. What happens during the procedure?  An IV will be inserted into one of your veins. For your safety, two health care providers will verify your identity along with the donor platelets about to be infused. A bag of donor platelets will be connected to your IV. The platelets will flow into your bloodstream. This usually takes 30-60 minutes. Your temperature, blood pressure, pulse, and breathing will be monitored during the transfusion. This helps detect early signs of any reaction. You will also be monitored for other symptoms that may indicate a reaction, including chills, hives, or itching. If you have signs of a reaction at any time, your transfusion will be stopped, and you may be given medicine to help manage the reaction. When your transfusion is complete, your IV will be removed. Pressure may be applied to the IV site for a few minutes to stop any bleeding. The IV site will be covered with a bandage (dressing). The procedure may vary among health care providers and hospitals. What can I expect after the procedure? Your blood pressure, temperature, pulse, and breathing will be monitored until you leave the hospital or clinic. You may have some bruising and soreness at your IV site. Follow these instructions at home: Medicines Take over-the-counter and prescription medicines only as told by your health care provider. Talk with your health care provider before you take any medicines that contain aspirin or NSAIDs, such as ibuprofen. These medicines increase your risk for dangerous bleeding. IV site care Check your IV site every day for signs of infection. Check for:  Redness, swelling, or pain. Fluid or blood. If fluid or blood drains from your IV site, use your  hands to press down firmly on a bandage covering the area for a minute or two. Doing this should stop the bleeding. Warmth. Pus or a bad smell. General instructions Change or remove your dressing as told by your health care provider. Return to your normal activities as told by your health care provider. Ask your health care provider what activities are safe for you. Do not take baths, swim, or use a hot tub until your health care provider approves. Ask your health care provider if you may take showers. Keep all follow-up visits. This is important. Contact a health care provider if: You have a headache that does not go away with medicine. You have hives, rash, or itchy skin. You have nausea or vomiting. You feel unusually tired or weak. You have signs of infection at your IV site. Get help right away if: You have a fever or chills. You urinate less often than usual. Your urine is darker colored than normal. You have any of the following: Trouble breathing. Pain in your back, abdomen, or chest. Cool, clammy skin. A fast heartbeat. Summary Platelets are tiny pieces of blood cells that clump together to form a blood clot when you have an injury. If you have too few platelets, your blood may have trouble clotting. A platelet transfusion is a procedure in which you receive donated platelets through an IV. A platelet transfusion may be used to stop or prevent excessive bleeding. After the procedure, check your IV site every day for signs of infection. This information is not intended to replace advice given to you by your health care provider. Make sure you discuss any questions you have with your health care provider. Document Revised: 10/05/2020 Document Reviewed: 10/05/2020 [employer] Patient Education  [place].

## 2021-09-27 ENCOUNTER — Inpatient Hospital Stay (HOSPITAL_BASED_OUTPATIENT_CLINIC_OR_DEPARTMENT_OTHER): Payer: Commercial Managed Care - PPO | Admitting: Physician Assistant

## 2021-09-27 ENCOUNTER — Other Ambulatory Visit: Payer: Self-pay

## 2021-09-27 ENCOUNTER — Inpatient Hospital Stay: Payer: Commercial Managed Care - PPO

## 2021-09-27 ENCOUNTER — Ambulatory Visit (HOSPITAL_COMMUNITY)
Admission: RE | Admit: 2021-09-27 | Discharge: 2021-09-27 | Disposition: A | Discharge status: Home / Self Care | Payer: Commercial Managed Care - PPO | Source: Ambulatory Visit | Attending: Internal Medicine | Admitting: Internal Medicine

## 2021-09-27 DIAGNOSIS — R21 Rash and other nonspecific skin eruption: Secondary | ICD-10-CM | POA: Diagnosis not present

## 2021-09-27 DIAGNOSIS — D696 Thrombocytopenia, unspecified: Secondary | ICD-10-CM

## 2021-09-27 DIAGNOSIS — D6481 Anemia due to antineoplastic chemotherapy: Secondary | ICD-10-CM

## 2021-09-27 DIAGNOSIS — C3490 Malignant neoplasm of unspecified part of unspecified bronchus or lung: Secondary | ICD-10-CM | POA: Diagnosis not present

## 2021-09-27 LAB — BPAM PLATELET PHERESIS
Blood Product Expiration Date: 202306142359
ISSUE DATE / TIME: 202306141557
Unit Type and Rh: 5100

## 2021-09-27 LAB — PREPARE PLATELET PHERESIS: Unit division: 0

## 2021-09-27 MED ORDER — IOHEXOL 300 MG/ML  SOLN
100.0000 mL | Freq: Once | INTRAMUSCULAR | Status: AC | PRN
  Administered 2021-09-27: 100 mL via INTRAVENOUS

## 2021-09-27 MED ORDER — SODIUM CHLORIDE 0.9% FLUSH
10.0000 mL | INTRAVENOUS | Status: AC | PRN
  Administered 2021-09-27: 10 mL

## 2021-09-27 MED ORDER — SODIUM CHLORIDE 0.9% IV SOLUTION
250.0000 mL | Freq: Once | INTRAVENOUS | Status: AC
  Administered 2021-09-27: 250 mL via INTRAVENOUS

## 2021-09-27 MED ORDER — SODIUM CHLORIDE (PF) 0.9 % IJ SOLN
INTRAMUSCULAR | Status: AC
  Filled 2021-09-27: qty 50

## 2021-09-27 MED ORDER — DIPHENHYDRAMINE HCL 25 MG PO CAPS
25.0000 mg | ORAL_CAPSULE | Freq: Once | ORAL | Status: AC
  Administered 2021-09-27: 25 mg via ORAL
  Filled 2021-09-27: qty 1

## 2021-09-27 MED ORDER — ACETAMINOPHEN 325 MG PO TABS
650.0000 mg | ORAL_TABLET | Freq: Once | ORAL | Status: AC
  Administered 2021-09-27: 650 mg via ORAL
  Filled 2021-09-27: qty 2

## 2021-09-27 MED ORDER — HEPARIN SOD (PORK) LOCK FLUSH 100 UNIT/ML IV SOLN
500.0000 [IU] | Freq: Every day | INTRAVENOUS | Status: AC | PRN
  Administered 2021-09-27: 500 [IU]

## 2021-09-27 MED ORDER — HEPARIN SOD (PORK) LOCK FLUSH 100 UNIT/ML IV SOLN
INTRAVENOUS | Status: DC
Start: 2021-09-27 — End: 2021-09-27
  Filled 2021-09-27: qty 5

## 2021-09-27 NOTE — Progress Notes (Signed)
Verbal order from Eloise Harman., PA.

## 2021-09-27 NOTE — Progress Notes (Signed)
Symptom Management Consult note [facility]    Patient Care Team: [name], MD as PCP - General (Internal Medicine) Gala Romney, Cristopher Estimable, MD (Gastroenterology) Valrie Hart, RN as Oncology Nurse Navigator (Oncology)    Name of the patient: [name] Potts  [id number]  1970-06-07   Date of visit: 09/27/2021    Chief complaint/ Reason for visit- rash  Oncology History  Adenocarcinoma of right lung, stage 4 ([place])  07/23/2021 Initial Diagnosis   Adenocarcinoma of right lung, stage 4 ([place])   07/23/2021 Cancer Staging   Staging form: Lung, AJCC 8th Edition - Clinical: Stage IVA (cT1b, cN2, cM1a) - Signed by Curt Bears, MD on 07/23/2021   08/01/2021 -  Chemotherapy   Patient is on Treatment Plan : LUNG Carboplatin (5) + Pemetrexed (500) + Pembrolizumab (200) D1 q21d Induction x 4 cycles / Maintenance Pemetrexed (500) + Pembrolizumab (200) D1 q21d       Current Therapy: carboplatin, pembrolizumab, pemetrexed  Day 1 Cycle 3 on 09/12/21  Interval history- [name] Potts is a 51 y.o. with oncologic history as above seen in the infusion center today with chief complaint of rash on left arm x 1 day.  Patient had platelet transfusion yesterday because of low platelet count of 7. He is currently receiving 2 units of blood as he was also anemic to 7.3. About 15 minutes into the blood transfusion he noticed a rash on his arm. He is not sure exactly when it started.  He admits he did not closely look at his arms this morning so it could have already been there.  He denies being in any pain currently.  He received premedications today of Tylenol and Benadryl.  He denies any injury to his arm.  Denies any new soaps or lotions.  Denies any fever, chills, shortness of breath, chest pain, nausea, emesis.      ROS  All other systems are reviewed and are negative for acute change except as noted in the HPI.    No Known Allergies   Past Medical History:  Diagnosis Date   ADHD  (attention deficit hyperactivity disorder)    Chronic back pain    Complication of anesthesia    woke up during 1 of 3 colonoscopies   Constipation    Dyspnea    Pneumonia    Seasonal allergies      Past Surgical History:  Procedure Laterality Date   COLONOSCOPY  10/11/2011   Procedure: COLONOSCOPY;  Surgeon: Daneil Dolin, MD;  Location: [facility];  Service: Endoscopy;  Laterality: N/A;  1:45 PM   COLONOSCOPY N/A 01/23/2018   Procedure: COLONOSCOPY;  Surgeon: Daneil Dolin, MD;  Location: [facility];  Service: Endoscopy;  Laterality: N/A;  1:00   IR IMAGING GUIDED PORT INSERTION  07/27/2021   NECK SURGERY     Apr 05, 2021- per pt report cervical surgery at day surgery center   POLYPECTOMY  01/23/2018   Procedure: POLYPECTOMY;  Surgeon: Daneil Dolin, MD;  Location: [facility];  Service: Endoscopy;;   VIDEO ASSISTED THORACOSCOPY (VATS)/DECORTICATION Right 06/27/2021   Procedure: VIDEO ASSISTED THORACOSCOPY (VATS), Drainage of RIGHT pleural effusion/ DECORTICATION;  Surgeon: Melrose Nakayama, MD;  Location: [facility] OR;  Service: Thoracic;  Laterality: Right;    Social History   Socioeconomic History   Marital status: Married    Spouse name: Not on file   Number of children: 1   Years of education: Not on file  Highest education level: Not on file  Occupational History   Occupation: Librarian, academic  Tobacco Use   Smoking status: Former    Packs/day: 0.50    Types: Cigarettes    Quit date: 03/15/2021    Years since quitting: 0.5   Smokeless tobacco: Current    Types: Snuff   Tobacco comments:    Using nicotine pouches  Vaping Use   Vaping Use: Former  Substance and Sexual Activity   Alcohol use: Not Currently    Comment: has not used since 20s   Drug use: Not Currently    Types: Marijuana    Comment: has not used since college   Sexual activity: Yes  Other Topics Concern   Not on file  Social History Narrative   Not on file   Social Determinants of  Health   Financial Resource Strain: Not on file  Food Insecurity: Not on file  Transportation Needs: Not on file  Physical Activity: Not on file  Stress: Not on file  Social Connections: Not on file  Intimate Partner Violence: Not on file    Family History  Problem Relation Age of Onset   Colon cancer Mother 68       passed at age 33   Stroke Father      Current Outpatient Medications:    ALPRAZolam (XANAX) 0.25 MG tablet, Take 1 tablet (0.25 mg total) by mouth daily as needed for anxiety., Disp: 20 tablet, Rfl: 0   apixaban (ELIQUIS) 5 MG TABS tablet, Take 2 tablets (10mg ) twice daily for 7 days, then 1 tablet (5mg ) twice daily (Patient taking differently: Take 5 mg by mouth 2 (two) times daily.), Disp: 60 tablet, Rfl: 3   atorvastatin (LIPITOR) 40 MG tablet, Take 1 tablet (40 mg total) by mouth daily., Disp: 90 tablet, Rfl: 1   FeFum-FePoly-FA-B Cmp-C-Biot (INTEGRA PLUS) CAPS, Take 1 capsule by mouth daily., Disp: 30 capsule, Rfl: 3   folic acid (FOLVITE) 1 MG tablet, Take 1 tablet (1 mg total) by mouth daily., Disp: 30 tablet, Rfl: 4   lidocaine-prilocaine (EMLA) cream, Apply to the Port-A-Cath site 30 minutes before chemotherapy, Disp: 30 g, Rfl: 0   mirtazapine (REMERON) 30 MG tablet, Take 1 tablet (30 mg total) by mouth at bedtime., Disp: 30 tablet, Rfl: 2   morphine (MS CONTIN) 15 MG 12 hr tablet, Take 1 tablet (15 mg total) by mouth every 12 (twelve) hours., Disp: 60 tablet, Rfl: 0   oxyCODONE (OXY IR/ROXICODONE) 5 MG immediate release tablet, Take 1 tablet (5 mg total) by mouth every 6 (six) hours as needed for moderate pain or severe pain., Disp: 30 tablet, Rfl: 0   polyethylene glycol (MIRALAX / GLYCOLAX) 17 g packet, Take 17 g by mouth daily as needed for mild constipation., Disp: 14 each, Rfl: 0   prochlorperazine (COMPAZINE) 10 MG tablet, TAKE 1 TABLET BY MOUTH EVERY 6 HOURS AS NEEDED FOR NAUSEA FOR VOMITING, Disp: 30 tablet, Rfl: 2 No current facility-administered  medications for this visit.  Facility-Administered Medications Ordered in Other Visits:    heparin lock flush 100 unit/mL, 500 Units, Intracatheter, Daily PRN, Curt Bears, MD   sodium chloride (PF) 0.9 % injection, , , ,    sodium chloride flush (NS) 0.9 % injection 10 mL, 10 mL, Intracatheter, PRN, Curt Bears, MD  PHYSICAL EXAM: ECOG FS:1 - Symptomatic but completely ambulatory   T: 98.6     BP:  104/72   HR: 102  Resp: 18   O2"  100% Physical Exam Vitals and nursing note reviewed.  Constitutional:      Appearance: He is well-developed. He is not ill-appearing or toxic-appearing.  HENT:     Head: Normocephalic and atraumatic.     Nose: Nose normal.  Eyes:     General: No scleral icterus.       Right eye: No discharge.        Left eye: No discharge.     Conjunctiva/sclera: Conjunctivae normal.  Neck:     Vascular: No JVD.  Cardiovascular:     Rate and Rhythm: Normal rate and regular rhythm.     Pulses: Normal pulses.     Heart sounds: Normal heart sounds.  Pulmonary:     Effort: Pulmonary effort is normal. No respiratory distress.     Breath sounds: Normal breath sounds. No stridor. No rhonchi or rales.  Chest:     Chest wall: No tenderness.  Abdominal:     General: There is no distension.  Musculoskeletal:        General: Normal range of motion.     Cervical back: Normal range of motion.  Skin:    General: Skin is warm and dry.     Findings: Petechiae (LUE) present.  Neurological:     Mental Status: He is oriented to person, place, and time.     GCS: GCS eye subscore is 4. GCS verbal subscore is 5. GCS motor subscore is 6.     Comments: Fluent speech, no facial droop.  Psychiatric:        Behavior: Behavior normal.        LABORATORY DATA: I have reviewed the data as listed    Latest Ref Rng & Units 09/26/2021    1:41 PM 09/21/2021    2:12 PM 09/12/2021    8:51 AM  CBC  WBC 4.0 - 10.5 K/uL 1.8  4.2  7.6   Hemoglobin 13.0 - 17.0 g/dL 7.3  8.2  10.1    Hematocrit 39.0 - 52.0 % 21.8  24.2  31.0   Platelets 150 - 400 K/uL 7  17  251         Latest Ref Rng & Units 09/26/2021    1:41 PM 09/21/2021    2:12 PM 09/12/2021    8:51 AM  CMP  Glucose 70 - 99 mg/dL 103  113  130   BUN 6 - 20 mg/dL 19  21  16    Creatinine 0.61 - 1.24 mg/dL 1.12  1.19  1.22   Sodium 135 - 145 mmol/L 137  135  135   Potassium 3.5 - 5.1 mmol/L 3.8  4.0  3.8   Chloride 98 - 111 mmol/L 102  98  100   CO2 22 - 32 mmol/L 25  29  27    Calcium 8.9 - 10.3 mg/dL 9.0  9.3  9.4   Total Protein 6.5 - 8.1 g/dL 7.4  7.0  7.5   Total Bilirubin 0.3 - 1.2 mg/dL 0.7  0.5  0.4   Alkaline Phos 38 - 126 U/L 170  189  182   AST 15 - 41 U/L 35  49  31   ALT 0 - 44 U/L 23  31  29         RADIOGRAPHIC STUDIES: I have personally reviewed the radiological images as listed and agreed with the findings in the report. No images are attached to the encounter. CT Chest W Contrast  Result Date: 09/27/2021 CLINICAL DATA:  Non-small-cell lung cancer.  Restaging. Vats on 06/27/2021. 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 Status post chemotherapy. * Tracking Code: BO * EXAM: CT CHEST, ABDOMEN, AND PELVIS WITH CONTRAST TECHNIQUE: Multidetector CT imaging of the chest, abdomen and pelvis was performed following the standard protocol during bolus administration of intravenous contrast. RADIATION DOSE REDUCTION: This exam was performed according to the departmental dose-optimization program which includes automated exposure control, adjustment of the mA and/or kV according to patient size and/or use of iterative reconstruction technique. CONTRAST:  135mL OMNIPAQUE IOHEXOL 300 MG/ML  SOLN COMPARISON:  Clinic note of 09/12/2021.  PET of 07/20/2021. FINDINGS: CT CHEST FINDINGS Cardiovascular: Left Port-A-Cath tip high right atrium. Aortic atherosclerosis. Normal heart size, without pericardial effusion. No central pulmonary embolism, on this non-dedicated study.  Mediastinum/Nodes: No supraclavicular adenopathy. The right paratracheal node of interest on the prior PET measures 7 mm on 13/2 today versus 8 mm previously. Subcarinal node measures 9 mm on 31/2 and is not significantly changed. No hilar adenopathy. Lungs/Pleura: Again identified is a small to moderate volume right-sided hydropneumothorax, with pleural air loculated inferiorly and anteriorly. The small pleural fluid component is similar. Circumferential pleural thickening persists. Example anteromedially at 11 mm on 25/2 versus 9 mm on the prior PET when measured in a similar level. Mild centrilobular emphysema. Patent airways. Slightly improved right lower lobe aeration with compressive atelectasis remaining. This may obscure the previously described hypermetabolic right lower lobe nodule. New right apical pleural-based soft tissue density measures 2.6 x 1.8 cm on 37/4 and 15/2. Musculoskeletal: No acute osseous abnormality. Cervical spine fixation. CT ABDOMEN PELVIS FINDINGS Hepatobiliary: A too small to characterize subcentimeter hypoattenuating right liver lobe lesion on 62/2 is similar to on the prior exam, favoring a cyst. Normal gallbladder, without biliary ductal dilatation. Pancreas: Normal, without mass or ductal dilatation. Spleen: Small volume superior splenic infarct on 58/2 is not readily apparent on the prior noncontrast nondedicated CT from PET. smaller more caudal splenic infarct. No splenomegaly. No perisplenic fluid. Adrenals/Urinary Tract: Normal adrenal glands. Bilateral renal too small to characterize lesions. Lower pole right renal 4.3 cm cyst or minimally complex cyst . In the absence of clinically indicated signs/symptoms require(s) no independent follow-up. No hydronephrosis. Normal urinary bladder. Stomach/Bowel: Normal colon and terminal ileum. Moderate amount of stool within the rectum. Normal terminal ileum. The appendix is prominent including at 8 mm on coronal image 103. Enlarged  since the prior. Normal small bowel. Vascular/Lymphatic: Aortic atherosclerosis. No abdominopelvic adenopathy. Reproductive: Normal prostate. Other: No significant free fluid. There is new peritoneal thickening within the pelvis, which is relatively symmetric on 107/2. Suspicion of subtle omental nodularity within the left side of the abdomen on 84/2. Felt to be new since the prior PET. No free intraperitoneal air. Tiny bilateral fat containing inguinal hernias. Musculoskeletal: Tiny right iliac sclerotic lesion is similar and likely a bone island. Lumbosacral spondylosis with S shaped thoracolumbar spine curvature. IMPRESSION: CT CHEST IMPRESSION 1. Since the PET of 07/20/2021, relatively similar appearance of the right pleural space, with small volume hydropneumothorax and circumferential pleural thickening, likely related to pleural metastasis. 2. Improved but persistent right lower lobe atelectasis, which may obscure the previously described hypermetabolic right lower lobe pulmonary nodule. 3. New 2.6 cm area of right apical pleuroparenchymal soft tissue density medially could represent progressive pleural disease or a developing lung metastasis. 4. Similar to decreased size of previously hypermetabolic thoracic nodes. 5. Aortic atherosclerosis (ICD10-I70.0) and emphysema (ICD10-J43.9). CT ABDOMEN AND PELVIS IMPRESSION 1. Development of subtle pelvic  peritoneal thickening and possible omental nodularity. Findings are suspicious for omental/peritoneal metastasis. 2. New borderline enlargement of the appendix. Correlate with right lower quadrant symptoms, as indolent appendicitis could less likely explain the symmetric pelvic peritoneal thickening. 3. No other evidence of metastatic disease within the abdomen or pelvis. 4. Small volume splenic infarcts, of indeterminate acuity. 5.  Possible constipation. These results will be called to the ordering clinician or representative by the Radiologist Assistant, and  communication documented in the PACS or [employer]. Electronically Signed   By: Abigail Miyamoto M.D.   On: 09/27/2021 12:31   CT Abdomen Pelvis W Contrast  Result Date: 09/27/2021 CLINICAL DATA:  Non-small-cell lung cancer. Restaging. Vats on 06/27/2021. 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 Status post chemotherapy. * Tracking Code: BO * EXAM: CT CHEST, ABDOMEN, AND PELVIS WITH CONTRAST TECHNIQUE: Multidetector CT imaging of the chest, abdomen and pelvis was performed following the standard protocol during bolus administration of intravenous contrast. RADIATION DOSE REDUCTION: This exam was performed according to the departmental dose-optimization program which includes automated exposure control, adjustment of the mA and/or kV according to patient size and/or use of iterative reconstruction technique. CONTRAST:  172mL OMNIPAQUE IOHEXOL 300 MG/ML  SOLN COMPARISON:  Clinic note of 09/12/2021.  PET of 07/20/2021. FINDINGS: CT CHEST FINDINGS Cardiovascular: Left Port-A-Cath tip high right atrium. Aortic atherosclerosis. Normal heart size, without pericardial effusion. No central pulmonary embolism, on this non-dedicated study. Mediastinum/Nodes: No supraclavicular adenopathy. The right paratracheal node of interest on the prior PET measures 7 mm on 13/2 today versus 8 mm previously. Subcarinal node measures 9 mm on 31/2 and is not significantly changed. No hilar adenopathy. Lungs/Pleura: Again identified is a small to moderate volume right-sided hydropneumothorax, with pleural air loculated inferiorly and anteriorly. The small pleural fluid component is similar. Circumferential pleural thickening persists. Example anteromedially at 11 mm on 25/2 versus 9 mm on the prior PET when measured in a similar level. Mild centrilobular emphysema. Patent airways. Slightly improved right lower lobe aeration with compressive atelectasis remaining. This may obscure the previously  described hypermetabolic right lower lobe nodule. New right apical pleural-based soft tissue density measures 2.6 x 1.8 cm on 37/4 and 15/2. Musculoskeletal: No acute osseous abnormality. Cervical spine fixation. CT ABDOMEN PELVIS FINDINGS Hepatobiliary: A too small to characterize subcentimeter hypoattenuating right liver lobe lesion on 62/2 is similar to on the prior exam, favoring a cyst. Normal gallbladder, without biliary ductal dilatation. Pancreas: Normal, without mass or ductal dilatation. Spleen: Small volume superior splenic infarct on 58/2 is not readily apparent on the prior noncontrast nondedicated CT from PET. smaller more caudal splenic infarct. No splenomegaly. No perisplenic fluid. Adrenals/Urinary Tract: Normal adrenal glands. Bilateral renal too small to characterize lesions. Lower pole right renal 4.3 cm cyst or minimally complex cyst . In the absence of clinically indicated signs/symptoms require(s) no independent follow-up. No hydronephrosis. Normal urinary bladder. Stomach/Bowel: Normal colon and terminal ileum. Moderate amount of stool within the rectum. Normal terminal ileum. The appendix is prominent including at 8 mm on coronal image 103. Enlarged since the prior. Normal small bowel. Vascular/Lymphatic: Aortic atherosclerosis. No abdominopelvic adenopathy. Reproductive: Normal prostate. Other: No significant free fluid. There is new peritoneal thickening within the pelvis, which is relatively symmetric on 107/2. Suspicion of subtle omental nodularity within the left side of the abdomen on 84/2. Felt to be new since the prior PET. No free intraperitoneal air. Tiny bilateral fat containing inguinal hernias. Musculoskeletal: Tiny right iliac sclerotic lesion  is similar and likely a bone island. Lumbosacral spondylosis with S shaped thoracolumbar spine curvature. IMPRESSION: CT CHEST IMPRESSION 1. Since the PET of 07/20/2021, relatively similar appearance of the right pleural space, with small  volume hydropneumothorax and circumferential pleural thickening, likely related to pleural metastasis. 2. Improved but persistent right lower lobe atelectasis, which may obscure the previously described hypermetabolic right lower lobe pulmonary nodule. 3. New 2.6 cm area of right apical pleuroparenchymal soft tissue density medially could represent progressive pleural disease or a developing lung metastasis. 4. Similar to decreased size of previously hypermetabolic thoracic nodes. 5. Aortic atherosclerosis (ICD10-I70.0) and emphysema (ICD10-J43.9). CT ABDOMEN AND PELVIS IMPRESSION 1. Development of subtle pelvic peritoneal thickening and possible omental nodularity. Findings are suspicious for omental/peritoneal metastasis. 2. New borderline enlargement of the appendix. Correlate with right lower quadrant symptoms, as indolent appendicitis could less likely explain the symmetric pelvic peritoneal thickening. 3. No other evidence of metastatic disease within the abdomen or pelvis. 4. Small volume splenic infarcts, of indeterminate acuity. 5.  Possible constipation. These results will be called to the ordering clinician or representative by the Radiologist Assistant, and communication documented in the PACS or [employer]. Electronically Signed   By: Abigail Miyamoto M.D.   On: 09/27/2021 12:31     ASSESSMENT & PLAN: Patient is a 51 y.o. male  with oncologic history of stage IV non-small cell lung cancer followed by Dr. Earlie Server.  I have viewed most recent oncology note and lab work.   #) Thrombocytopenia-still have a low platelet count yesterday of 7 and received platelet transfusion.  Exam today showing petechiae which is 2/2 to his low platelet count.  No signs of blood transfusion reaction.  Discussed with oncologist who agrees with plan to have patient return early next week for labs to check CBC.  Patient and wife agreeable with plan to come in to clinic Monday for lab work.  Patient stable to proceed with  blood transfusion.  He tolerated remainder of transfusion well.  Visit Diagnosis: 1. Thrombocytopenia ([place])      No orders of the defined types were placed in this encounter.   All questions were answered. The patient knows to call the clinic with any problems, questions or concerns. No barriers to learning was detected.  I have spent a total of 10 minutes minutes of face-to-face and non-face-to-face time, preparing to see the patient, obtaining and/or reviewing separately obtained history, performing a medically appropriate examination, counseling and educating the patient,  documenting clinical information in the electronic health record, and care coordination.     Thank you for allowing me to participate in the care of this patient.    Barrie Folk, PA-C Department of Hematology/Oncology [facility] at [facility] Phone: [phone]  [phone]    09/27/2021 3:49 PM

## 2021-09-27 NOTE — Patient Instructions (Signed)
Blood Transfusion, Adult A blood transfusion is a procedure in which you receive blood through an IV tube. You may need this procedure because of: A bleeding disorder. An illness. An injury. A surgery. The blood may come from someone else (a donor). You may also be able to donate blood for yourself. The blood given in a transfusion is made up of different types of cells. You may get: Red blood cells. These carry oxygen to the cells in the body. White blood cells. These help you fight infections. Platelets. These help your blood to clot. Plasma. This is the liquid part of your blood. It carries proteins and other substances through the body. If you have a clotting disorder, you may also get other types of blood products. Tell your doctor about: Any blood disorders you have. Any reactions you have had during a blood transfusion in the past. Any allergies you have. All medicines you are taking, including vitamins, herbs, eye drops, creams, and over-the-counter medicines. Any surgeries you have had. Any medical conditions you have. This includes any recent fever or cold symptoms. Whether you are pregnant or may be pregnant. What are the risks? Generally, this is a safe procedure. However, problems may occur. The most common problems include: A mild allergic reaction. This includes red, swollen areas of skin (hives) and itching. Fever or chills. This may be the body's response to new blood cells received. This may happen during or up to 4 hours after the transfusion. More serious problems may include: Too much fluid in the lungs. This may cause breathing problems. A serious allergic reaction. This includes breathing trouble or swelling around the face and lips. Lung injury. This causes breathing trouble and low oxygen in the blood. This can happen within hours of the transfusion or days later. Too much iron. This can happen after getting many blood transfusions over a period of time. An  infection or virus passed through the blood. This is rare. Donated blood is carefully tested before it is given. Your body's defense system (immune system) trying to attack the new blood cells. This is rare. Symptoms may include fever, chills, nausea, low blood pressure, and low back or chest pain. Donated cells attacking healthy tissues. This is rare. What happens before the procedure? Medicines Ask your doctor about: Changing or stopping your normal medicines. This is important. Taking aspirin and ibuprofen. Do not take these medicines unless your doctor tells you to take them. Taking over-the-counter medicines, vitamins, herbs, and supplements. General instructions Follow instructions from your doctor about what you cannot eat or drink. You will have a blood test to find out your blood type. The test also finds out what type of blood your body will accept and matches it to the donor type. If you are going to have a planned surgery, you may be able to donate your own blood. This may be done in case you need a transfusion. You will have your temperature, blood pressure, and pulse checked. You may receive medicine to help prevent an allergic reaction. This may be done if you have had a reaction to a transfusion before. This medicine may be given to you by mouth or through an IV tube. This procedure lasts about 1-4 hours. Plan for the time you need. What happens during the procedure?  An IV tube will be put into one of your veins. The bag of donated blood will be attached to your IV tube. Then, the blood will enter through your vein. Your temperature,   blood pressure, and pulse will be checked often. This is done to find early signs of a transfusion reaction. Tell your nurse right away if you have any of these symptoms: Shortness of breath or trouble breathing. Chest or back pain. Fever or chills. Red, swollen areas of skin or itching. If you have any signs or symptoms of a reaction, your  transfusion will be stopped. You may also be given medicine. When the transfusion is finished, your IV tube will be taken out. Pressure may be put on the IV site for a few minutes. A bandage (dressing) will be put on the IV site. The procedure may vary among doctors and hospitals. What happens after the procedure? You will be monitored until you leave the hospital or clinic. This includes checking your temperature, blood pressure, pulse, breathing rate, and blood oxygen level. Your blood may be tested to see how you are responding to the transfusion. You may be warmed with fluids or blankets. This is done to keep the temperature of your body normal. If you have your procedure in an outpatient setting, you will be told whom to contact to report any reactions. Where to find more information To learn more, visit the [employer]: redcross.org Summary A blood transfusion is a procedure in which you are given blood through an IV tube. The blood may come from someone else (a donor). You may also be able to donate blood for yourself. The blood you are given is made up of different blood cells. You may receive red blood cells, platelets, plasma, or white blood cells. Your temperature, blood pressure, and pulse will be checked often. After the procedure, your blood may be tested to see how you are responding. This information is not intended to replace advice given to you by your health care provider. Make sure you discuss any questions you have with your health care provider. Document Revised: 09/24/2018 Document Reviewed: 09/24/2018 [employer] Patient Education  2023 [employer]  

## 2021-09-27 NOTE — Progress Notes (Signed)
Patient getting blood today- just after the 15 minute mark on his first unit of blood, patient's wife asked if the "rash" on his left hand was normal - he also had a few on his right arm and chest. Infusion was stopped about 1306. Milinda Cave, PA symptom management was called. This RN, Another RN Dorien Chihuahua) and the PA assessed the patient. The patient was having no distress, VSS and the "rash" his wife was referring to did not change. His platelets were very low (it appeared to be petechiae and there were no raised areas) - Dr. Julien Nordmann was consulted and it was decided that the rash was petechiae and the infusion would continue. The patient tolerated the infusion well to completion with no major changes to his VS. Of note- the patient did receive his premeds- 650 mg of acetaminophen and 25 mg of benadryl.  Patient tolerated both units of blood well. Ambulatory out of the facility with no complaints or symptoms of distress.

## 2021-09-28 LAB — TYPE AND SCREEN
ABO/RH(D): O POS
Antibody Screen: NEGATIVE
Unit division: 0
Unit division: 0

## 2021-09-28 LAB — BPAM RBC
Blood Product Expiration Date: 202307082359
Blood Product Expiration Date: 202307082359
ISSUE DATE / TIME: 202306151202
ISSUE DATE / TIME: 202306151202
Unit Type and Rh: 5100
Unit Type and Rh: 5100

## 2021-10-01 ENCOUNTER — Other Ambulatory Visit: Payer: Self-pay

## 2021-10-01 ENCOUNTER — Other Ambulatory Visit: Payer: Commercial Managed Care - PPO

## 2021-10-01 ENCOUNTER — Ambulatory Visit: Payer: Commercial Managed Care - PPO

## 2021-10-01 ENCOUNTER — Inpatient Hospital Stay: Payer: Commercial Managed Care - PPO

## 2021-10-01 ENCOUNTER — Inpatient Hospital Stay (HOSPITAL_BASED_OUTPATIENT_CLINIC_OR_DEPARTMENT_OTHER): Payer: Commercial Managed Care - PPO | Admitting: Physician Assistant

## 2021-10-01 ENCOUNTER — Ambulatory Visit: Payer: Commercial Managed Care - PPO | Admitting: Physician Assistant

## 2021-10-01 VITALS — BP 117/75 | HR 95 | Temp 98.1°F | Resp 16 | Ht 72.0 in | Wt 173.0 lb

## 2021-10-01 DIAGNOSIS — C3491 Malignant neoplasm of unspecified part of right bronchus or lung: Secondary | ICD-10-CM | POA: Diagnosis not present

## 2021-10-01 DIAGNOSIS — D6481 Anemia due to antineoplastic chemotherapy: Secondary | ICD-10-CM

## 2021-10-01 DIAGNOSIS — Z95828 Presence of other vascular implants and grafts: Secondary | ICD-10-CM

## 2021-10-01 DIAGNOSIS — D696 Thrombocytopenia, unspecified: Secondary | ICD-10-CM

## 2021-10-01 DIAGNOSIS — R21 Rash and other nonspecific skin eruption: Secondary | ICD-10-CM | POA: Diagnosis not present

## 2021-10-01 DIAGNOSIS — T451X5A Adverse effect of antineoplastic and immunosuppressive drugs, initial encounter: Secondary | ICD-10-CM

## 2021-10-01 LAB — CBC WITH DIFFERENTIAL (CANCER CENTER ONLY)
Abs Immature Granulocytes: 0.04 10*3/uL (ref 0.00–0.07)
Basophils Absolute: 0 10*3/uL (ref 0.0–0.1)
Basophils Relative: 0 %
Eosinophils Absolute: 0 10*3/uL (ref 0.0–0.5)
Eosinophils Relative: 1 %
HCT: 28.3 % — ABNORMAL LOW (ref 39.0–52.0)
Hemoglobin: 9.6 g/dL — ABNORMAL LOW (ref 13.0–17.0)
Immature Granulocytes: 2 %
Lymphocytes Relative: 37 %
Lymphs Abs: 0.9 10*3/uL (ref 0.7–4.0)
MCH: 27.4 pg (ref 26.0–34.0)
MCHC: 33.9 g/dL (ref 30.0–36.0)
MCV: 80.6 fL (ref 80.0–100.0)
Monocytes Absolute: 0.8 10*3/uL (ref 0.1–1.0)
Monocytes Relative: 30 %
Neutro Abs: 0.8 10*3/uL — ABNORMAL LOW (ref 1.7–7.7)
Neutrophils Relative %: 30 %
Platelet Count: 53 10*3/uL — ABNORMAL LOW (ref 150–400)
RBC: 3.51 MIL/uL — ABNORMAL LOW (ref 4.22–5.81)
RDW: 19.8 % — ABNORMAL HIGH (ref 11.5–15.5)
Smear Review: NORMAL
WBC Count: 2.5 10*3/uL — ABNORMAL LOW (ref 4.0–10.5)
nRBC: 0 % (ref 0.0–0.2)

## 2021-10-01 LAB — CMP (CANCER CENTER ONLY)
ALT: 21 U/L (ref 0–44)
AST: 35 U/L (ref 15–41)
Albumin: 3.5 g/dL (ref 3.5–5.0)
Alkaline Phosphatase: 188 U/L — ABNORMAL HIGH (ref 38–126)
Anion gap: 8 (ref 5–15)
BUN: 13 mg/dL (ref 6–20)
CO2: 27 mmol/L (ref 22–32)
Calcium: 9.1 mg/dL (ref 8.9–10.3)
Chloride: 102 mmol/L (ref 98–111)
Creatinine: 1.17 mg/dL (ref 0.61–1.24)
GFR, Estimated: >60 mL/min (ref >60)
Glucose, Bld: 104 mg/dL — ABNORMAL HIGH (ref 70–99)
Potassium: 3.6 mmol/L (ref 3.5–5.1)
Sodium: 137 mmol/L (ref 135–145)
Total Bilirubin: 0.6 mg/dL (ref 0.3–1.2)
Total Protein: 7.3 g/dL (ref 6.5–8.1)

## 2021-10-01 LAB — SAMPLE TO BLOOD BANK

## 2021-10-01 MED ORDER — SODIUM CHLORIDE 0.9% FLUSH
10.0000 mL | Freq: Once | INTRAVENOUS | Status: AC
  Administered 2021-10-01: 10 mL

## 2021-10-01 MED ORDER — HEPARIN SOD (PORK) LOCK FLUSH 100 UNIT/ML IV SOLN
500.0000 [IU] | Freq: Once | INTRAVENOUS | Status: AC
  Administered 2021-10-01: 500 [IU]

## 2021-10-01 NOTE — Progress Notes (Signed)
Symptom Management Consult note [place]    Patient Care Team: [name], MD as PCP - General (Internal Medicine) Gala Romney, Cristopher Estimable, MD (Gastroenterology) Valrie Hart, RN as Oncology Nurse Navigator (Oncology)    Name of the patient: [name] Potts  [id number]  10-Feb-1971   Date of visit: 10/01/2021    Chief complaint/ Reason for visit- lab recheck  Oncology History  Adenocarcinoma of right lung, stage 4 ([place])  07/23/2021 Initial Diagnosis   Adenocarcinoma of right lung, stage 4 ([place])   07/23/2021 Cancer Staging   Staging form: Lung, AJCC 8th Edition - Clinical: Stage IVA (cT1b, cN2, cM1a) - Signed by Curt Bears, MD on 07/23/2021   08/01/2021 -  Chemotherapy   Patient is on Treatment Plan : LUNG Carboplatin (5) + Pemetrexed (500) + Pembrolizumab (200) D1 q21d Induction x 4 cycles / Maintenance Pemetrexed (500) + Pembrolizumab (200) D1 q21d       Current Therapy: Carboplatin, dexamethasone, pembrolizumab, pemetrexed  Day 1 Cycle 3 on 09/12/21  Interval history- [name] Potts is a 51 y.o. with oncologic history as above presenting to [facility] today with reason for needing CBC rechecked. Patient's daughter accompanies him today. Last week patient had thrombocytopenia (7) and anemia (7.3) and received platelet transfusion and 2 units of blood. He noticed petechiae on his left arm while having the blood transfusion. He reports since having the transfusions he is feeling much better. He had enough energy to fix his dryer for the weekend and reports he has a small bruise on his left forearm that occurred while working on it. He denies any new areas of petechiae. He has not had any gum bleeding recently and is currently using a soft bristle toothbrush. Patient is currently anticoagulated on eliquis for DVT found on [place] on 07/17/21. He also reports coughing up specks of bright red blood which happens approximately 4 times daily.  He denies coughing up blood clots.  He states  this has been going on since he started chemotherapy and he has made his oncologist aware.  He denies any fever, chills, fatigue, chest pain, shortness of breath, palpitations, syncope, abdominal pain, nausea, urinary symptoms, diarrhea.     ROS  All other systems are reviewed and are negative for acute change except as noted in the HPI.    No Known Allergies   Past Medical History:  Diagnosis Date   ADHD (attention deficit hyperactivity disorder)    Chronic back pain    Complication of anesthesia    woke up during 1 of 3 colonoscopies   Constipation    Dyspnea    Pneumonia    Seasonal allergies      Past Surgical History:  Procedure Laterality Date   COLONOSCOPY  10/11/2011   Procedure: COLONOSCOPY;  Surgeon: Daneil Dolin, MD;  Location: [facility];  Service: Endoscopy;  Laterality: N/A;  1:45 PM   COLONOSCOPY N/A 01/23/2018   Procedure: COLONOSCOPY;  Surgeon: Daneil Dolin, MD;  Location: [facility];  Service: Endoscopy;  Laterality: N/A;  1:00   IR IMAGING GUIDED PORT INSERTION  07/27/2021   NECK SURGERY     Apr 05, 2021- per pt report cervical surgery at day surgery center   POLYPECTOMY  01/23/2018   Procedure: POLYPECTOMY;  Surgeon: Daneil Dolin, MD;  Location: [facility];  Service: Endoscopy;;   VIDEO ASSISTED THORACOSCOPY (VATS)/DECORTICATION Right 06/27/2021   Procedure: VIDEO ASSISTED THORACOSCOPY (VATS), Drainage of RIGHT pleural effusion/ DECORTICATION;  Surgeon: Melrose Nakayama, MD;  Location: [facility];  Service: Thoracic;  Laterality: Right;    Social History   Socioeconomic History   Marital status: Married    Spouse name: Not on file   Number of children: 1   Years of education: Not on file   Highest education level: Not on file  Occupational History   Occupation: Librarian, academic  Tobacco Use   Smoking status: Former    Packs/day: 0.50    Types: Cigarettes    Quit date: 03/15/2021    Years since quitting: 0.5   Smokeless tobacco:  Current    Types: Snuff   Tobacco comments:    Using nicotine pouches  Vaping Use   Vaping Use: Former  Substance and Sexual Activity   Alcohol use: Not Currently    Comment: has not used since 20s   Drug use: Not Currently    Types: Marijuana    Comment: has not used since college   Sexual activity: Yes  Other Topics Concern   Not on file  Social History Narrative   Not on file   Social Determinants of Health   Financial Resource Strain: Not on file  Food Insecurity: Not on file  Transportation Needs: Not on file  Physical Activity: Not on file  Stress: Not on file  Social Connections: Not on file  Intimate Partner Violence: Not on file    Family History  Problem Relation Age of Onset   Colon cancer Mother 24       passed at age 50   Stroke Father      Current Outpatient Medications:    ALPRAZolam (XANAX) 0.25 MG tablet, Take 1 tablet (0.25 mg total) by mouth daily as needed for anxiety., Disp: 20 tablet, Rfl: 0   apixaban (ELIQUIS) 5 MG TABS tablet, Take 2 tablets (10mg ) twice daily for 7 days, then 1 tablet (5mg ) twice daily (Patient taking differently: Take 5 mg by mouth 2 (two) times daily.), Disp: 60 tablet, Rfl: 3   atorvastatin (LIPITOR) 40 MG tablet, Take 1 tablet (40 mg total) by mouth daily., Disp: 90 tablet, Rfl: 1   FeFum-FePoly-FA-B Cmp-C-Biot (INTEGRA PLUS) CAPS, Take 1 capsule by mouth daily., Disp: 30 capsule, Rfl: 3   folic acid (FOLVITE) 1 MG tablet, Take 1 tablet (1 mg total) by mouth daily., Disp: 30 tablet, Rfl: 4   lidocaine-prilocaine (EMLA) cream, Apply to the Port-A-Cath site 30 minutes before chemotherapy, Disp: 30 g, Rfl: 0   mirtazapine (REMERON) 30 MG tablet, Take 1 tablet (30 mg total) by mouth at bedtime., Disp: 30 tablet, Rfl: 2   morphine (MS CONTIN) 15 MG 12 hr tablet, Take 1 tablet (15 mg total) by mouth every 12 (twelve) hours., Disp: 60 tablet, Rfl: 0   oxyCODONE (OXY IR/ROXICODONE) 5 MG immediate release tablet, Take 1 tablet (5 mg  total) by mouth every 6 (six) hours as needed for moderate pain or severe pain., Disp: 30 tablet, Rfl: 0   polyethylene glycol (MIRALAX / GLYCOLAX) 17 g packet, Take 17 g by mouth daily as needed for mild constipation., Disp: 14 each, Rfl: 0   prochlorperazine (COMPAZINE) 10 MG tablet, TAKE 1 TABLET BY MOUTH EVERY 6 HOURS AS NEEDED FOR NAUSEA FOR VOMITING, Disp: 30 tablet, Rfl: 2  PHYSICAL EXAM: ECOG FS:1 - Symptomatic but completely ambulatory    Vitals:   10/01/21 1019  BP: 117/75  Pulse: 95  Resp: 16  Temp: 98.1 F (36.7 C)  TempSrc: Oral  SpO2:  98%  Weight: 173 lb (78.5 kg)  Height: 6' (1.829 m)   Physical Exam Vitals and nursing note reviewed.  Constitutional:      Appearance: He is well-developed. He is not ill-appearing or toxic-appearing.  HENT:     Head: Normocephalic and atraumatic.     Nose: Nose normal.  Eyes:     General: No scleral icterus.       Right eye: No discharge.        Left eye: No discharge.     Conjunctiva/sclera: Conjunctivae normal.  Neck:     Vascular: No JVD.  Cardiovascular:     Rate and Rhythm: Normal rate and regular rhythm.     Pulses: Normal pulses.     Heart sounds: Normal heart sounds.  Pulmonary:     Effort: Pulmonary effort is normal.     Breath sounds: Normal breath sounds.     Comments: Lung sounds decreased on right compared to the left. Oxygen saturation 95% during exam. Taking in full sentences.  Abdominal:     General: There is no distension.  Musculoskeletal:        General: Normal range of motion.     Cervical back: Normal range of motion.  Skin:    General: Skin is warm and dry.     Comments: Faint petechiae seen on left forearm  Neurological:     Mental Status: He is oriented to person, place, and time.     GCS: GCS eye subscore is 4. GCS verbal subscore is 5. GCS motor subscore is 6.     Comments: Fluent speech, no facial droop.  Psychiatric:        Behavior: Behavior normal.        LABORATORY DATA: I have  reviewed the data as listed    Latest Ref Rng & Units 10/01/2021   10:10 AM 09/26/2021    1:41 PM 09/21/2021    2:12 PM  CBC  WBC 4.0 - 10.5 K/uL 2.5  1.8  4.2   Hemoglobin 13.0 - 17.0 g/dL 9.6  7.3  8.2   Hematocrit 39.0 - 52.0 % 28.3  21.8  24.2   Platelets 150 - 400 K/uL 53  7  17         Latest Ref Rng & Units 10/01/2021   10:10 AM 09/26/2021    1:41 PM 09/21/2021    2:12 PM  CMP  Glucose 70 - 99 mg/dL 104  103  113   BUN 6 - 20 mg/dL 13  19  21    Creatinine 0.61 - 1.24 mg/dL 1.17  1.12  1.19   Sodium 135 - 145 mmol/L 137  137  135   Potassium 3.5 - 5.1 mmol/L 3.6  3.8  4.0   Chloride 98 - 111 mmol/L 102  102  98   CO2 22 - 32 mmol/L 27  25  29    Calcium 8.9 - 10.3 mg/dL 9.1  9.0  9.3   Total Protein 6.5 - 8.1 g/dL 7.3  7.4  7.0   Total Bilirubin 0.3 - 1.2 mg/dL 0.6  0.7  0.5   Alkaline Phos 38 - 126 U/L 188  170  189   AST 15 - 41 U/L 35  35  49   ALT 0 - 44 U/L 21  23  31         RADIOGRAPHIC STUDIES: I have personally reviewed the radiological images as listed and agreed with the findings in the report. No images are  attached to the encounter. CT Chest W Contrast  Result Date: 09/27/2021 CLINICAL DATA:  Non-small-cell lung cancer. Restaging. Vats on 06/27/2021. 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 Status post chemotherapy. * Tracking Code: BO * EXAM: CT CHEST, ABDOMEN, AND PELVIS WITH CONTRAST TECHNIQUE: Multidetector CT imaging of the chest, abdomen and pelvis was performed following the standard protocol during bolus administration of intravenous contrast. RADIATION DOSE REDUCTION: This exam was performed according to the departmental dose-optimization program which includes automated exposure control, adjustment of the mA and/or kV according to patient size and/or use of iterative reconstruction technique. CONTRAST:  118mL OMNIPAQUE IOHEXOL 300 MG/ML  SOLN COMPARISON:  Clinic note of 09/12/2021.  PET of 07/20/2021. FINDINGS: CT CHEST  FINDINGS Cardiovascular: Left Port-A-Cath tip high right atrium. Aortic atherosclerosis. Normal heart size, without pericardial effusion. No central pulmonary embolism, on this non-dedicated study. Mediastinum/Nodes: No supraclavicular adenopathy. The right paratracheal node of interest on the prior PET measures 7 mm on 13/2 today versus 8 mm previously. Subcarinal node measures 9 mm on 31/2 and is not significantly changed. No hilar adenopathy. Lungs/Pleura: Again identified is a small to moderate volume right-sided hydropneumothorax, with pleural air loculated inferiorly and anteriorly. The small pleural fluid component is similar. Circumferential pleural thickening persists. Example anteromedially at 11 mm on 25/2 versus 9 mm on the prior PET when measured in a similar level. Mild centrilobular emphysema. Patent airways. Slightly improved right lower lobe aeration with compressive atelectasis remaining. This may obscure the previously described hypermetabolic right lower lobe nodule. New right apical pleural-based soft tissue density measures 2.6 x 1.8 cm on 37/4 and 15/2. Musculoskeletal: No acute osseous abnormality. Cervical spine fixation. CT ABDOMEN PELVIS FINDINGS Hepatobiliary: A too small to characterize subcentimeter hypoattenuating right liver lobe lesion on 62/2 is similar to on the prior exam, favoring a cyst. Normal gallbladder, without biliary ductal dilatation. Pancreas: Normal, without mass or ductal dilatation. Spleen: Small volume superior splenic infarct on 58/2 is not readily apparent on the prior noncontrast nondedicated CT from PET. smaller more caudal splenic infarct. No splenomegaly. No perisplenic fluid. Adrenals/Urinary Tract: Normal adrenal glands. Bilateral renal too small to characterize lesions. Lower pole right renal 4.3 cm cyst or minimally complex cyst . In the absence of clinically indicated signs/symptoms require(s) no independent follow-up. No hydronephrosis. Normal urinary  bladder. Stomach/Bowel: Normal colon and terminal ileum. Moderate amount of stool within the rectum. Normal terminal ileum. The appendix is prominent including at 8 mm on coronal image 103. Enlarged since the prior. Normal small bowel. Vascular/Lymphatic: Aortic atherosclerosis. No abdominopelvic adenopathy. Reproductive: Normal prostate. Other: No significant free fluid. There is new peritoneal thickening within the pelvis, which is relatively symmetric on 107/2. Suspicion of subtle omental nodularity within the left side of the abdomen on 84/2. Felt to be new since the prior PET. No free intraperitoneal air. Tiny bilateral fat containing inguinal hernias. Musculoskeletal: Tiny right iliac sclerotic lesion is similar and likely a bone island. Lumbosacral spondylosis with S shaped thoracolumbar spine curvature. IMPRESSION: CT CHEST IMPRESSION 1. Since the PET of 07/20/2021, relatively similar appearance of the right pleural space, with small volume hydropneumothorax and circumferential pleural thickening, likely related to pleural metastasis. 2. Improved but persistent right lower lobe atelectasis, which may obscure the previously described hypermetabolic right lower lobe pulmonary nodule. 3. New 2.6 cm area of right apical pleuroparenchymal soft tissue density medially could represent progressive pleural disease or a developing lung metastasis. 4. Similar to decreased size of previously hypermetabolic thoracic  nodes. 5. Aortic atherosclerosis (ICD10-I70.0) and emphysema (ICD10-J43.9). CT ABDOMEN AND PELVIS IMPRESSION 1. Development of subtle pelvic peritoneal thickening and possible omental nodularity. Findings are suspicious for omental/peritoneal metastasis. 2. New borderline enlargement of the appendix. Correlate with right lower quadrant symptoms, as indolent appendicitis could less likely explain the symmetric pelvic peritoneal thickening. 3. No other evidence of metastatic disease within the abdomen or  pelvis. 4. Small volume splenic infarcts, of indeterminate acuity. 5.  Possible constipation. These results will be called to the ordering clinician or representative by the Radiologist Assistant, and communication documented in the PACS or [employer]. Electronically Signed   By: Abigail Miyamoto M.D.   On: 09/27/2021 12:31   CT Abdomen Pelvis W Contrast  Result Date: 09/27/2021 CLINICAL DATA:  Non-small-cell lung cancer. Restaging. Vats on 06/27/2021. 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 Status post chemotherapy. * Tracking Code: BO * EXAM: CT CHEST, ABDOMEN, AND PELVIS WITH CONTRAST TECHNIQUE: Multidetector CT imaging of the chest, abdomen and pelvis was performed following the standard protocol during bolus administration of intravenous contrast. RADIATION DOSE REDUCTION: This exam was performed according to the departmental dose-optimization program which includes automated exposure control, adjustment of the mA and/or kV according to patient size and/or use of iterative reconstruction technique. CONTRAST:  147mL OMNIPAQUE IOHEXOL 300 MG/ML  SOLN COMPARISON:  Clinic note of 09/12/2021.  PET of 07/20/2021. FINDINGS: CT CHEST FINDINGS Cardiovascular: Left Port-A-Cath tip high right atrium. Aortic atherosclerosis. Normal heart size, without pericardial effusion. No central pulmonary embolism, on this non-dedicated study. Mediastinum/Nodes: No supraclavicular adenopathy. The right paratracheal node of interest on the prior PET measures 7 mm on 13/2 today versus 8 mm previously. Subcarinal node measures 9 mm on 31/2 and is not significantly changed. No hilar adenopathy. Lungs/Pleura: Again identified is a small to moderate volume right-sided hydropneumothorax, with pleural air loculated inferiorly and anteriorly. The small pleural fluid component is similar. Circumferential pleural thickening persists. Example anteromedially at 11 mm on 25/2 versus 9 mm on the prior PET  when measured in a similar level. Mild centrilobular emphysema. Patent airways. Slightly improved right lower lobe aeration with compressive atelectasis remaining. This may obscure the previously described hypermetabolic right lower lobe nodule. New right apical pleural-based soft tissue density measures 2.6 x 1.8 cm on 37/4 and 15/2. Musculoskeletal: No acute osseous abnormality. Cervical spine fixation. CT ABDOMEN PELVIS FINDINGS Hepatobiliary: A too small to characterize subcentimeter hypoattenuating right liver lobe lesion on 62/2 is similar to on the prior exam, favoring a cyst. Normal gallbladder, without biliary ductal dilatation. Pancreas: Normal, without mass or ductal dilatation. Spleen: Small volume superior splenic infarct on 58/2 is not readily apparent on the prior noncontrast nondedicated CT from PET. smaller more caudal splenic infarct. No splenomegaly. No perisplenic fluid. Adrenals/Urinary Tract: Normal adrenal glands. Bilateral renal too small to characterize lesions. Lower pole right renal 4.3 cm cyst or minimally complex cyst . In the absence of clinically indicated signs/symptoms require(s) no independent follow-up. No hydronephrosis. Normal urinary bladder. Stomach/Bowel: Normal colon and terminal ileum. Moderate amount of stool within the rectum. Normal terminal ileum. The appendix is prominent including at 8 mm on coronal image 103. Enlarged since the prior. Normal small bowel. Vascular/Lymphatic: Aortic atherosclerosis. No abdominopelvic adenopathy. Reproductive: Normal prostate. Other: No significant free fluid. There is new peritoneal thickening within the pelvis, which is relatively symmetric on 107/2. Suspicion of subtle omental nodularity within the left side of the abdomen on 84/2. Felt to be new since the  prior PET. No free intraperitoneal air. Tiny bilateral fat containing inguinal hernias. Musculoskeletal: Tiny right iliac sclerotic lesion is similar and likely a bone island.  Lumbosacral spondylosis with S shaped thoracolumbar spine curvature. IMPRESSION: CT CHEST IMPRESSION 1. Since the PET of 07/20/2021, relatively similar appearance of the right pleural space, with small volume hydropneumothorax and circumferential pleural thickening, likely related to pleural metastasis. 2. Improved but persistent right lower lobe atelectasis, which may obscure the previously described hypermetabolic right lower lobe pulmonary nodule. 3. New 2.6 cm area of right apical pleuroparenchymal soft tissue density medially could represent progressive pleural disease or a developing lung metastasis. 4. Similar to decreased size of previously hypermetabolic thoracic nodes. 5. Aortic atherosclerosis (ICD10-I70.0) and emphysema (ICD10-J43.9). CT ABDOMEN AND PELVIS IMPRESSION 1. Development of subtle pelvic peritoneal thickening and possible omental nodularity. Findings are suspicious for omental/peritoneal metastasis. 2. New borderline enlargement of the appendix. Correlate with right lower quadrant symptoms, as indolent appendicitis could less likely explain the symmetric pelvic peritoneal thickening. 3. No other evidence of metastatic disease within the abdomen or pelvis. 4. Small volume splenic infarcts, of indeterminate acuity. 5.  Possible constipation. These results will be called to the ordering clinician or representative by the Radiologist Assistant, and communication documented in the PACS or [employer]. Electronically Signed   By: Abigail Miyamoto M.D.   On: 09/27/2021 12:31     ASSESSMENT & PLAN: Patient is a 51 y.o. male  with oncologic history of stage IV non-small cell lung cancer, adenocarcinoma followed by Dr. Julien Nordmann.  I have viewed most recent oncology note and lab work.   #)Pancytopenia-  Likely chemotherapy related. WBC today is 2.5, ANC 0.8, hgb 9.6 and platelet count 53. Patient is nontoxic-appearing.  He is afebrile and denying any infectious symptoms. Exam with faint petechiae,  normal work of breathing.  Discussed neutropenic precautions with patient.  Overall labs have improved since last week. Patient is anticoagulated on Eliquis for DVT and was advised to be careful to avoid any injuries as best as possible and to seek emergency medical treatment if he has large episodes of blood loss.   #)Stage IV non-small cell lung cancer- Patient had recent restaging scan on 09/27/21. He will discuss results with oncologist at next scheduled appointment which is in x 2 days. I discussed patient with Cassandra Heilingoetter PA-C who is planning to talk further about patient with oncologist later today to determine if next treatment should be held.  Visit Diagnosis: 1. Port-A-Cath in place   2. Anemia due to antineoplastic chemotherapy   3. Thrombocytopenia ([place])   4. Adenocarcinoma of right lung, stage 4 ([facility])      No orders of the defined types were placed in this encounter.   All questions were answered. The patient knows to call the clinic with any problems, questions or concerns. No barriers to learning was detected.  I have spent a total of 20 minutes minutes of face-to-face and non-face-to-face time, preparing to see the patient, obtaining and/or reviewing separately obtained history, performing a medically appropriate examination, counseling and educating the patient, ordering tests,  documenting clinical information in the electronic health record, and care coordination.     Thank you for allowing me to participate in the care of this patient.    Barrie Folk, PA-C Department of Hematology/Oncology [facility] at [facility] Phone: [phone]  [phone]    10/01/2021 1:05 PM

## 2021-10-02 MED FILL — Fosaprepitant Dimeglumine For IV Infusion 150 MG (Base Eq): INTRAVENOUS | Qty: 5 | Status: AC

## 2021-10-02 MED FILL — Dexamethasone Sodium Phosphate Inj 100 MG/10ML: INTRAMUSCULAR | Qty: 1 | Status: AC

## 2021-10-02 NOTE — Progress Notes (Unsigned)
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 His molecular studies showed no actionable mutation and PD-L1 expression was 1%. It showed KRAS G12D, Myc amplification, CCND3 amplification, TP53 R248L, VEGFA amplification but these are not actionable at this time.  PRIOR THERAPY: carboplatin for AUC of 5, Alimta 500 Mg/M2 and Keytruda 200 Mg IV every 3 weeks. First dose on 08/01/21. Status post 3 cycle. Alimta dose reduced to 400 mg/m2 starting from cycle #2.  Discontinued secondary to disease progression  CURRENT THERAPY: Second line chemotherapy with docetaxel 75 mg per metered squared and Cyramza 10 mg/kg IV every 3 weeks with Neulasta support.  First dose expected on 10/10/2021  INTERVAL HISTORY: JAMONI HEWES 51 y.o. male returns to the clinic today for follow-up visit accompanied by his wife.  The patient was recently diagnosed with stage IV lung cancer.  He is undergoing palliative systemic chemotherapy and immunotherapy.  The patient has been having a challenging time with treatment with significant pancytopenia despite having chemotherapy dose reduction with his Alimta.  The patient required transfusion support in the interval with platelet transfusions x2***the most recent being on 09/20/2021 abnormal bleeding or bruising since that time?  Blood thinner for DVT?  Fatigue, fevers, chills, night sweats, or unexplained weight loss?  He continues to have some anorexia following treatment for which she will occasionally receive IV fluid a few days following treatment.  He reports baseline dyspnea on exertion.  Of note the patient has a hydropneumothorax?  He  denies any unusual cough or hemoptysis.  He continues to have some rib pain for which he takes MS Contin 15 mg twice daily.  He denies any nausea, vomiting, diarrhea, or constipation?  Constipation following treatment pain medicine use?  He denies any rashes or skin changes.  Denies any headache or visual changes.  The patient recently had a restaging CT scan performed.  The patient is here today for evaluation to review his scan results before starting cycle #4.  MEDICAL HISTORY: Past Medical History:  Diagnosis Date   ADHD (attention deficit hyperactivity disorder)    Chronic back pain    Complication of anesthesia    woke up during 1 of 3 colonoscopies   Constipation    Dyspnea    Pneumonia    Seasonal allergies     ALLERGIES:  has No Known Allergies.  MEDICATIONS:  Current Outpatient Medications  Medication Sig Dispense Refill   ALPRAZolam (XANAX) 0.25 MG tablet Take 1 tablet (0.25 mg total) by mouth daily as needed for anxiety. 20 tablet 0   apixaban (ELIQUIS) 5 MG TABS tablet Take 2 tablets ($RemoveBe'10mg'BcKZMEKLu$ ) twice daily for 7 days, then 1 tablet ($RemoveB'5mg'IRHvTCvg$ ) twice daily (Patient taking differently: Take 5 mg by mouth 2 (two) times daily.) 60 tablet 3   atorvastatin (LIPITOR) 40 MG tablet Take 1 tablet (40 mg total) by mouth daily. 90 tablet 1   FeFum-FePoly-FA-B Cmp-C-Biot (INTEGRA PLUS) CAPS Take 1 capsule by mouth daily. 30 capsule 3   folic acid (FOLVITE) 1 MG tablet Take 1 tablet (1 mg total) by mouth daily. 30 tablet 4   lidocaine-prilocaine (EMLA) cream Apply to the Port-A-Cath site 30 minutes before chemotherapy 30 g 0  mirtazapine (REMERON) 30 MG tablet Take 1 tablet (30 mg total) by mouth at bedtime. 30 tablet 2   morphine (MS CONTIN) 15 MG 12 hr tablet Take 1 tablet (15 mg total) by mouth every 12 (twelve) hours. 60 tablet 0   oxyCODONE (OXY IR/ROXICODONE) 5 MG immediate release tablet Take 1 tablet (5 mg total) by mouth every 6 (six) hours as needed for moderate pain or severe pain. 30  tablet 0   polyethylene glycol (MIRALAX / GLYCOLAX) 17 g packet Take 17 g by mouth daily as needed for mild constipation. 14 each 0   prochlorperazine (COMPAZINE) 10 MG tablet TAKE 1 TABLET BY MOUTH EVERY 6 HOURS AS NEEDED FOR NAUSEA FOR VOMITING 30 tablet 2   No current facility-administered medications for this visit.    SURGICAL HISTORY:  Past Surgical History:  Procedure Laterality Date   COLONOSCOPY  10/11/2011   Procedure: COLONOSCOPY;  Surgeon: Daneil Dolin, MD;  Location: [facility];  Service: Endoscopy;  Laterality: N/A;  1:45 PM   COLONOSCOPY N/A 01/23/2018   Procedure: COLONOSCOPY;  Surgeon: Daneil Dolin, MD;  Location: [facility];  Service: Endoscopy;  Laterality: N/A;  1:00   IR IMAGING GUIDED PORT INSERTION  07/27/2021   NECK SURGERY     Apr 05, 2021- per pt report cervical surgery at day surgery center   POLYPECTOMY  01/23/2018   Procedure: POLYPECTOMY;  Surgeon: Daneil Dolin, MD;  Location: [facility];  Service: Endoscopy;;   VIDEO ASSISTED THORACOSCOPY (VATS)/DECORTICATION Right 06/27/2021   Procedure: VIDEO ASSISTED THORACOSCOPY (VATS), Drainage of RIGHT pleural effusion/ DECORTICATION;  Surgeon: Melrose Nakayama, MD;  Location: [facility] OR;  Service: Thoracic;  Laterality: Right;    REVIEW OF SYSTEMS:   Review of Systems  Constitutional: Negative for appetite change, chills, fatigue, fever and unexpected weight change.  HENT:   Negative for mouth sores, nosebleeds, sore throat and trouble swallowing.   Eyes: Negative for eye problems and icterus.  Respiratory: Negative for cough, hemoptysis, shortness of breath and wheezing.   Cardiovascular: Negative for chest pain and leg swelling.  Gastrointestinal: Negative for abdominal pain, constipation, diarrhea, nausea and vomiting.  Genitourinary: Negative for bladder incontinence, difficulty urinating, dysuria, frequency and hematuria.   Musculoskeletal: Negative for back pain, gait problem, neck pain and  neck stiffness.  Skin: Negative for itching and rash.  Neurological: Negative for dizziness, extremity weakness, gait problem, headaches, light-headedness and seizures.  Hematological: Negative for adenopathy. Does not bruise/bleed easily.  Psychiatric/Behavioral: Negative for confusion, depression and sleep disturbance. The patient is not nervous/anxious.     PHYSICAL EXAMINATION:  There were no vitals taken for this visit.  ECOG PERFORMANCE STATUS: {CHL ONC ECOG [id number]  Physical Exam  Constitutional: Oriented to person, place, and time and well-developed, well-nourished, and in no distress. No distress.  HENT:  Head: Normocephalic and atraumatic.  Mouth/Throat: Oropharynx is clear and moist. No oropharyngeal exudate.  Eyes: Conjunctivae are normal. Right eye exhibits no discharge. Left eye exhibits no discharge. No scleral icterus.  Neck: Normal range of motion. Neck supple.  Cardiovascular: Normal rate, regular rhythm, normal heart sounds and intact distal pulses.   Pulmonary/Chest: Effort normal and breath sounds normal. No respiratory distress. No wheezes. No rales.  Abdominal: Soft. Bowel sounds are normal. Exhibits no distension and no mass. There is no tenderness.  Musculoskeletal: Normal range of motion. Exhibits no edema.  Lymphadenopathy:    No cervical adenopathy.  Neurological: Alert and oriented to person, place, and  time. Exhibits normal muscle tone. Gait normal. Coordination normal.  Skin: Skin is warm and dry. No rash noted. Not diaphoretic. No erythema. No pallor.  Psychiatric: Mood, memory and judgment normal.  Vitals reviewed.  LABORATORY DATA: Lab Results  Component Value Date   WBC 2.5 (L) 10/01/2021   HGB 9.6 (L) 10/01/2021   HCT 28.3 (L) 10/01/2021   MCV 80.6 10/01/2021   PLT 53 (L) 10/01/2021      Chemistry      Component Value Date/Time   NA 137 10/01/2021 1010   NA 140 11/22/2020 1644   K 3.6 10/01/2021 1010   CL 102 10/01/2021 1010    CO2 27 10/01/2021 1010   BUN 13 10/01/2021 1010   BUN 13 11/22/2020 1644   CREATININE 1.17 10/01/2021 1010      Component Value Date/Time   CALCIUM 9.1 10/01/2021 1010   ALKPHOS 188 (H) 10/01/2021 1010   AST 35 10/01/2021 1010   ALT 21 10/01/2021 1010   BILITOT 0.6 10/01/2021 1010       RADIOGRAPHIC STUDIES:  CT Chest W Contrast  Result Date: 09/27/2021 CLINICAL DATA:  Non-small-cell lung cancer. Restaging. Vats on 06/27/2021. 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 Status post chemotherapy. * Tracking Code: BO * EXAM: CT CHEST, ABDOMEN, AND PELVIS WITH CONTRAST TECHNIQUE: Multidetector CT imaging of the chest, abdomen and pelvis was performed following the standard protocol during bolus administration of intravenous contrast. RADIATION DOSE REDUCTION: This exam was performed according to the departmental dose-optimization program which includes automated exposure control, adjustment of the mA and/or kV according to patient size and/or use of iterative reconstruction technique. CONTRAST:  138mL OMNIPAQUE IOHEXOL 300 MG/ML  SOLN COMPARISON:  Clinic note of 09/12/2021.  PET of 07/20/2021. FINDINGS: CT CHEST FINDINGS Cardiovascular: Left Port-A-Cath tip high right atrium. Aortic atherosclerosis. Normal heart size, without pericardial effusion. No central pulmonary embolism, on this non-dedicated study. Mediastinum/Nodes: No supraclavicular adenopathy. The right paratracheal node of interest on the prior PET measures 7 mm on 13/2 today versus 8 mm previously. Subcarinal node measures 9 mm on 31/2 and is not significantly changed. No hilar adenopathy. Lungs/Pleura: Again identified is a small to moderate volume right-sided hydropneumothorax, with pleural air loculated inferiorly and anteriorly. The small pleural fluid component is similar. Circumferential pleural thickening persists. Example anteromedially at 11 mm on 25/2 versus 9 mm on the prior PET when measured  in a similar level. Mild centrilobular emphysema. Patent airways. Slightly improved right lower lobe aeration with compressive atelectasis remaining. This may obscure the previously described hypermetabolic right lower lobe nodule. New right apical pleural-based soft tissue density measures 2.6 x 1.8 cm on 37/4 and 15/2. Musculoskeletal: No acute osseous abnormality. Cervical spine fixation. CT ABDOMEN PELVIS FINDINGS Hepatobiliary: A too small to characterize subcentimeter hypoattenuating right liver lobe lesion on 62/2 is similar to on the prior exam, favoring a cyst. Normal gallbladder, without biliary ductal dilatation. Pancreas: Normal, without mass or ductal dilatation. Spleen: Small volume superior splenic infarct on 58/2 is not readily apparent on the prior noncontrast nondedicated CT from PET. smaller more caudal splenic infarct. No splenomegaly. No perisplenic fluid. Adrenals/Urinary Tract: Normal adrenal glands. Bilateral renal too small to characterize lesions. Lower pole right renal 4.3 cm cyst or minimally complex cyst . In the absence of clinically indicated signs/symptoms require(s) no independent follow-up. No hydronephrosis. Normal urinary bladder. Stomach/Bowel: Normal colon and terminal ileum. Moderate amount of stool within the rectum. Normal terminal ileum. The appendix is prominent  including at 8 mm on coronal image 103. Enlarged since the prior. Normal small bowel. Vascular/Lymphatic: Aortic atherosclerosis. No abdominopelvic adenopathy. Reproductive: Normal prostate. Other: No significant free fluid. There is new peritoneal thickening within the pelvis, which is relatively symmetric on 107/2. Suspicion of subtle omental nodularity within the left side of the abdomen on 84/2. Felt to be new since the prior PET. No free intraperitoneal air. Tiny bilateral fat containing inguinal hernias. Musculoskeletal: Tiny right iliac sclerotic lesion is similar and likely a bone island. Lumbosacral  spondylosis with S shaped thoracolumbar spine curvature. IMPRESSION: CT CHEST IMPRESSION 1. Since the PET of 07/20/2021, relatively similar appearance of the right pleural space, with small volume hydropneumothorax and circumferential pleural thickening, likely related to pleural metastasis. 2. Improved but persistent right lower lobe atelectasis, which may obscure the previously described hypermetabolic right lower lobe pulmonary nodule. 3. New 2.6 cm area of right apical pleuroparenchymal soft tissue density medially could represent progressive pleural disease or a developing lung metastasis. 4. Similar to decreased size of previously hypermetabolic thoracic nodes. 5. Aortic atherosclerosis (ICD10-I70.0) and emphysema (ICD10-J43.9). CT ABDOMEN AND PELVIS IMPRESSION 1. Development of subtle pelvic peritoneal thickening and possible omental nodularity. Findings are suspicious for omental/peritoneal metastasis. 2. New borderline enlargement of the appendix. Correlate with right lower quadrant symptoms, as indolent appendicitis could less likely explain the symmetric pelvic peritoneal thickening. 3. No other evidence of metastatic disease within the abdomen or pelvis. 4. Small volume splenic infarcts, of indeterminate acuity. 5.  Possible constipation. These results will be called to the ordering clinician or representative by the Radiologist Assistant, and communication documented in the PACS or [employer]. Electronically Signed   By: Abigail Miyamoto M.D.   On: 09/27/2021 12:31   CT Abdomen Pelvis W Contrast  Result Date: 09/27/2021 CLINICAL DATA:  Non-small-cell lung cancer. Restaging. Vats on 06/27/2021. 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 Status post chemotherapy. * Tracking Code: BO * EXAM: CT CHEST, ABDOMEN, AND PELVIS WITH CONTRAST TECHNIQUE: Multidetector CT imaging of the chest, abdomen and pelvis was performed following the standard protocol during bolus  administration of intravenous contrast. RADIATION DOSE REDUCTION: This exam was performed according to the departmental dose-optimization program which includes automated exposure control, adjustment of the mA and/or kV according to patient size and/or use of iterative reconstruction technique. CONTRAST:  19mL OMNIPAQUE IOHEXOL 300 MG/ML  SOLN COMPARISON:  Clinic note of 09/12/2021.  PET of 07/20/2021. FINDINGS: CT CHEST FINDINGS Cardiovascular: Left Port-A-Cath tip high right atrium. Aortic atherosclerosis. Normal heart size, without pericardial effusion. No central pulmonary embolism, on this non-dedicated study. Mediastinum/Nodes: No supraclavicular adenopathy. The right paratracheal node of interest on the prior PET measures 7 mm on 13/2 today versus 8 mm previously. Subcarinal node measures 9 mm on 31/2 and is not significantly changed. No hilar adenopathy. Lungs/Pleura: Again identified is a small to moderate volume right-sided hydropneumothorax, with pleural air loculated inferiorly and anteriorly. The small pleural fluid component is similar. Circumferential pleural thickening persists. Example anteromedially at 11 mm on 25/2 versus 9 mm on the prior PET when measured in a similar level. Mild centrilobular emphysema. Patent airways. Slightly improved right lower lobe aeration with compressive atelectasis remaining. This may obscure the previously described hypermetabolic right lower lobe nodule. New right apical pleural-based soft tissue density measures 2.6 x 1.8 cm on 37/4 and 15/2. Musculoskeletal: No acute osseous abnormality. Cervical spine fixation. CT ABDOMEN PELVIS FINDINGS Hepatobiliary: A too small to characterize subcentimeter hypoattenuating right liver lobe  lesion on 62/2 is similar to on the prior exam, favoring a cyst. Normal gallbladder, without biliary ductal dilatation. Pancreas: Normal, without mass or ductal dilatation. Spleen: Small volume superior splenic infarct on 58/2 is not readily  apparent on the prior noncontrast nondedicated CT from PET. smaller more caudal splenic infarct. No splenomegaly. No perisplenic fluid. Adrenals/Urinary Tract: Normal adrenal glands. Bilateral renal too small to characterize lesions. Lower pole right renal 4.3 cm cyst or minimally complex cyst . In the absence of clinically indicated signs/symptoms require(s) no independent follow-up. No hydronephrosis. Normal urinary bladder. Stomach/Bowel: Normal colon and terminal ileum. Moderate amount of stool within the rectum. Normal terminal ileum. The appendix is prominent including at 8 mm on coronal image 103. Enlarged since the prior. Normal small bowel. Vascular/Lymphatic: Aortic atherosclerosis. No abdominopelvic adenopathy. Reproductive: Normal prostate. Other: No significant free fluid. There is new peritoneal thickening within the pelvis, which is relatively symmetric on 107/2. Suspicion of subtle omental nodularity within the left side of the abdomen on 84/2. Felt to be new since the prior PET. No free intraperitoneal air. Tiny bilateral fat containing inguinal hernias. Musculoskeletal: Tiny right iliac sclerotic lesion is similar and likely a bone island. Lumbosacral spondylosis with S shaped thoracolumbar spine curvature. IMPRESSION: CT CHEST IMPRESSION 1. Since the PET of 07/20/2021, relatively similar appearance of the right pleural space, with small volume hydropneumothorax and circumferential pleural thickening, likely related to pleural metastasis. 2. Improved but persistent right lower lobe atelectasis, which may obscure the previously described hypermetabolic right lower lobe pulmonary nodule. 3. New 2.6 cm area of right apical pleuroparenchymal soft tissue density medially could represent progressive pleural disease or a developing lung metastasis. 4. Similar to decreased size of previously hypermetabolic thoracic nodes. 5. Aortic atherosclerosis (ICD10-I70.0) and emphysema (ICD10-J43.9). CT ABDOMEN AND  PELVIS IMPRESSION 1. Development of subtle pelvic peritoneal thickening and possible omental nodularity. Findings are suspicious for omental/peritoneal metastasis. 2. New borderline enlargement of the appendix. Correlate with right lower quadrant symptoms, as indolent appendicitis could less likely explain the symmetric pelvic peritoneal thickening. 3. No other evidence of metastatic disease within the abdomen or pelvis. 4. Small volume splenic infarcts, of indeterminate acuity. 5.  Possible constipation. These results will be called to the ordering clinician or representative by the Radiologist Assistant, and communication documented in the PACS or [employer]. Electronically Signed   By: Abigail Miyamoto M.D.   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 His molecular studies showed no actionable mutation and PD-L1 expression was 1%. It showed KRAS G12D, Myc amplification, CCND3 amplification, TP53 R248L, VEGFA amplification but these are not actionable at this time.   The patient is currently undergoing palliative systemic chemotherapy with carboplatin for an AUC of 5, Alimta 500 mg per metered square, Keytruda 200 mg IV every 3 weeks.  Alimta was dose reduced starting from cycle #2 due to renal insufficiency. He is status post 3 cycles.  He had fatigue, nausea, and dehydration, and anorexia with cycle #1.  He had significant pancytopenia with treatment requiring transfusion support.  The patient recently had a restaging CT scan performed.  Dr. Julien Nordmann personally and independently reviewed the scan discussed the results  with the patient today.  Unfortunately the  patient's scan showed evidence of disease progression with ***  Dr. Julien Nordmann had a lengthy discussion with the patient today about his current condition and recommended treatment options.  Dr. Julien Nordmann recommends changing his treatment to second line systemic chemotherapy with docetaxel 75 mg per metered squared and Cyramza 10 mg/kg IV every 3 weeks with Neulasta support.  The patient is interested in this option and he is expected to receive his first dose of treatment next week on 10/10/2021.  I have sent a prescription for Compazine 10 mg p.o. every 6 hours to the patient's pharmacy for nausea and vomiting.  I sent a prescription for Decadron to take 2 tablets twice a day the day before, the day of, and the day after treatment.  Discussed with the patient that he can stop taking folic acid.  I will arrange for him to come back to the clinic in 2 weeks for 1 week follow-up visit and to manage any adverse side effects of treatment.  Bleeding?  Dose reduction due to pancytopenia?  Add sample blood bank  IV fluids today  Xanax and pain medicine?  The patient was advised to call immediately if he has any concerning symptoms in the interval. The patient voices understanding of current disease status and treatment options and is in agreement with the current care plan. All questions were answered. The patient knows to call the clinic with any problems, questions or concerns. We can certainly see the patient much sooner if necessary         No orders of the defined types were placed in this encounter.    I spent {CHL ONC TIME VISIT - GKKDP:[id number]} counseling the patient face to face. The total time spent in the appointment was {CHL ONC TIME VISIT - DUPBD:[id number]}.  Tysin Salada L Yeng Frankie, PA-C 10/02/21

## 2021-10-03 ENCOUNTER — Other Ambulatory Visit: Payer: Self-pay

## 2021-10-03 ENCOUNTER — Encounter: Payer: Self-pay | Admitting: Emergency Medicine

## 2021-10-03 ENCOUNTER — Inpatient Hospital Stay: Payer: Commercial Managed Care - PPO

## 2021-10-03 ENCOUNTER — Inpatient Hospital Stay (HOSPITAL_BASED_OUTPATIENT_CLINIC_OR_DEPARTMENT_OTHER): Payer: Commercial Managed Care - PPO | Admitting: Physician Assistant

## 2021-10-03 VITALS — BP 125/86 | HR 99 | Temp 97.8°F | Resp 16 | Wt 171.4 lb

## 2021-10-03 DIAGNOSIS — C3491 Malignant neoplasm of unspecified part of right bronchus or lung: Secondary | ICD-10-CM | POA: Diagnosis not present

## 2021-10-03 DIAGNOSIS — Z5111 Encounter for antineoplastic chemotherapy: Secondary | ICD-10-CM | POA: Diagnosis not present

## 2021-10-03 DIAGNOSIS — R21 Rash and other nonspecific skin eruption: Secondary | ICD-10-CM | POA: Diagnosis not present

## 2021-10-03 DIAGNOSIS — Z7189 Other specified counseling: Secondary | ICD-10-CM

## 2021-10-03 DIAGNOSIS — Z95828 Presence of other vascular implants and grafts: Secondary | ICD-10-CM

## 2021-10-03 LAB — CBC WITH DIFFERENTIAL (CANCER CENTER ONLY)
Abs Immature Granulocytes: 0.07 10*3/uL (ref 0.00–0.07)
Basophils Absolute: 0 10*3/uL (ref 0.0–0.1)
Basophils Relative: 0 %
Eosinophils Absolute: 0 10*3/uL (ref 0.0–0.5)
Eosinophils Relative: 0 %
HCT: 29.2 % — ABNORMAL LOW (ref 39.0–52.0)
Hemoglobin: 9.8 g/dL — ABNORMAL LOW (ref 13.0–17.0)
Immature Granulocytes: 2 %
Lymphocytes Relative: 21 %
Lymphs Abs: 1 10*3/uL (ref 0.7–4.0)
MCH: 27.5 pg (ref 26.0–34.0)
MCHC: 33.6 g/dL (ref 30.0–36.0)
MCV: 82 fL (ref 80.0–100.0)
Monocytes Absolute: 0.9 10*3/uL (ref 0.1–1.0)
Monocytes Relative: 18 %
Neutro Abs: 2.7 10*3/uL (ref 1.7–7.7)
Neutrophils Relative %: 59 %
Platelet Count: 116 10*3/uL — ABNORMAL LOW (ref 150–400)
RBC: 3.56 MIL/uL — ABNORMAL LOW (ref 4.22–5.81)
RDW: 20.7 % — ABNORMAL HIGH (ref 11.5–15.5)
WBC Count: 4.6 10*3/uL (ref 4.0–10.5)
nRBC: 0 % (ref 0.0–0.2)

## 2021-10-03 LAB — CMP (CANCER CENTER ONLY)
ALT: 22 U/L (ref 0–44)
AST: 34 U/L (ref 15–41)
Albumin: 3.5 g/dL (ref 3.5–5.0)
Alkaline Phosphatase: 175 U/L — ABNORMAL HIGH (ref 38–126)
Anion gap: 7 (ref 5–15)
BUN: 12 mg/dL (ref 6–20)
CO2: 27 mmol/L (ref 22–32)
Calcium: 9.4 mg/dL (ref 8.9–10.3)
Chloride: 102 mmol/L (ref 98–111)
Creatinine: 1.25 mg/dL — ABNORMAL HIGH (ref 0.61–1.24)
GFR, Estimated: >60 mL/min (ref >60)
Glucose, Bld: 108 mg/dL — ABNORMAL HIGH (ref 70–99)
Potassium: 4 mmol/L (ref 3.5–5.1)
Sodium: 136 mmol/L (ref 135–145)
Total Bilirubin: 0.6 mg/dL (ref 0.3–1.2)
Total Protein: 7.3 g/dL (ref 6.5–8.1)

## 2021-10-03 LAB — TSH: TSH: 2.261 u[IU]/mL (ref 0.350–4.500)

## 2021-10-03 MED ORDER — HEPARIN SOD (PORK) LOCK FLUSH 100 UNIT/ML IV SOLN
500.0000 [IU] | Freq: Once | INTRAVENOUS | Status: AC
  Administered 2021-10-03: 500 [IU] via INTRAVENOUS

## 2021-10-03 MED ORDER — SODIUM CHLORIDE 0.9% FLUSH
10.0000 mL | Freq: Once | INTRAVENOUS | Status: AC
  Administered 2021-10-03: 10 mL

## 2021-10-03 MED ORDER — SODIUM CHLORIDE 0.9% FLUSH
10.0000 mL | Freq: Once | INTRAVENOUS | Status: AC
  Administered 2021-10-03: 10 mL via INTRAVENOUS

## 2021-10-03 NOTE — Research (Signed)
A RANDOMIZED, OPEN-LABEL PHASE 2/3 STUDY COMPARING COBOLIMAB + DOSTARLIMAB + DOCETAXEL TO DOSTARLIMAB + DOCETAXEL TO DOCETAXEL ALONE IN PARTICIPANTS WITH ADVANCED NON-SMALL CELL  LUNG CANCER WHO HAVE PROGRESSED ON PRIOR ANTI-PD-(L)1 THERAPY AND CHEMOTHERAPY (COSTAR LUNG)  INTRO STUDY/CONSENTS  Patient [name] Potts was identified by PA Cassie as a potential candidate for the above listed study.  This Clinical Research Nurse met with LEKEITH WULF, [id number], on 10/03/21 in a manner and location that ensures patient privacy to discuss participation in the above listed research study.  Patient is Accompanied by daughter [name] .  A copy of the informed consent document with embedded HIPAA language was provided to the patient.  Patient reads, speaks, and understands [place].   Patient was provided with the business card of this Nurse and encouraged to contact the research team with any questions.  Approximately 40 minutes were spent with the patient reviewing the informed consent documents.  Patient was provided the option of taking informed consent documents home to review and was encouraged to review at their convenience with their support network, including other care providers. Patient took the consent documents home to review.  Will f/u with patient in the next few days to determine interest.  Wells Guiles 'Donell Sievert, RN, BSN Clinical Research Nurse I 10/03/21 11:24 AM

## 2021-10-03 NOTE — Patient Instructions (Signed)
-  We covered a lot of important information at your appointment today regarding what the treatment plan is moving forward. Here are the the main points that were discussed at your office visit with [place] today:  -The treatment that you will receive consists of two chemotherapy drugs, called Docetaxel and Cyramza (Ramucirumab) -We are planning on starting your treatment next week on __ -Your treatment will be given once every 3 weeks. We will check your labs once a week just to make sure that important components of your blood are in an acceptable range -You will receive an injection about 3 days after chemotherapy. The purpose of this injection is to boost your bodies infection fighting cells to protect you from getting an infection.  -We will get a CT scan after 3 treatments to check on the progress of treatment  Medications:  -I have sent a few important medication prescriptions to your pharmacy.  -Compazine was sent to your pharmacy. This medication is for nausea. You may take this every 6 hours as needed if you feel nausous.  -I have sent a prescription for Decadron (Dexamethasone) to your pharmacy. This medication is a steroids. The purpose of this medication is because it is one if the pre-medications that is needed before you get treatment. You will need to take Two Tablets Twice a day (for a total of 4 tablets a day) the day before, the day of, and the day after chemotherapy. This means that you will take 12 tablets total over those 3 consecutive days.   Molecular Studies   Imaging Studies:  -We are going to re-scan your brain and make sure there is nothing concerning that is affecting your vision.   Referrals:   Side effects: The side effects of treatment may include but are not limited to alopecia (losing your hair), myelosuppression (dropping your blood counts), nausea and vomiting, peripheral neuropathy (numbness in the hands and feet), liver or renal dysfunction as well as the adverse  effect of Cyramza including increased risk for hemorrhage (bleeding) and GI perforation. Please seek medical attention if you develop concerning or significant bleeding.   Follow up:  -We will see you back for a follow up visit in __ weeks.

## 2021-10-04 ENCOUNTER — Other Ambulatory Visit: Payer: Self-pay | Admitting: Physician Assistant

## 2021-10-04 ENCOUNTER — Telehealth: Payer: Self-pay | Admitting: Emergency Medicine

## 2021-10-04 DIAGNOSIS — C3491 Malignant neoplasm of unspecified part of right bronchus or lung: Secondary | ICD-10-CM

## 2021-10-04 DIAGNOSIS — H539 Unspecified visual disturbance: Secondary | ICD-10-CM

## 2021-10-04 NOTE — Telephone Encounter (Signed)
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  Lurena Joiner 'Warden FillersDairl Ponder, RN, BSN Clinical Research Nurse I 10/04/21 11:47 AM

## 2021-10-05 ENCOUNTER — Encounter: Payer: Self-pay | Admitting: *Deleted

## 2021-10-05 ENCOUNTER — Telehealth: Payer: Self-pay | Admitting: Emergency Medicine

## 2021-10-05 NOTE — Progress Notes (Signed)
Oncology Nurse Navigator Documentation     10/05/2021    2:00 PM 08/10/2021    1:00 PM 07/27/2021    1:00 PM 07/24/2021    9:00 AM 07/24/2021    8:00 AM 07/13/2021    3:00 PM 07/11/2021    9:00 AM  Oncology Nurse Navigator Flowsheets  Abnormal Finding Date       05/29/2021  Confirmed Diagnosis Date       06/27/2021  Diagnosis Status       Confirmed Diagnosis Complete  Planned Course of Treatment   Chemotherapy;Targeted Therapy  Chemotherapy;Targeted Therapy    Phase of Treatment   Targeted Therapy  Targeted Therapy    Chemotherapy Actual Start Date:   08/06/2021      Targeted Therapy Actual Start Date:   08/06/2021      Surgery Actual Start Date:  06/27/2021       Navigator Follow Up Date:   08/20/2021  07/26/2021    Navigator Follow Up Reason:   Follow-up Appointment  Appointment Review    Navigation Complete Date: 10/05/2021        Post Navigation: Continue to Follow Patient? No        Reason Not Navigating Patient: Patient On Maintenance Chemotherapy        Navigator Location [facility]  Navigator Encounter Type  Appt/Treatment Plan Review Other: Telephone Appt/Treatment Plan Review Pathology Review;Molecular Studies Other:  Telephone    Outgoing Call     Treatment Initiated Date  06/27/2021       Patient Visit Type  Other   Other    Treatment Phase  Follow-up Pre-Tx/Tx Discussion Pre-Tx/Tx Discussion Pre-Tx/Tx Discussion    Barriers/Navigation Needs  Coordination of Care Coordination of Care Coordination of Care;Education Coordination of Care Coordination of Care Coordination of Care  Education    Other     Interventions  Coordination of Care Coordination of Care Coordination of Care;Psycho-Social Support;Education Coordination of Care Coordination of Care Coordination of Care  Acuity  Level 2-Minimal Needs (1-2 Barriers Identified) Level 2-Minimal Needs (1-2 Barriers Identified) Level  3-Moderate Needs (3-4 Barriers Identified) Level 2-Minimal Needs (1-2 Barriers Identified) Level 2-Minimal Needs (1-2 Barriers Identified) Level 2-Minimal Needs (1-2 Barriers Identified)  Coordination of Care  Other Other Appts Other Pathology Other  Education Method    Verbal     Time Spent with Patient 15 30 30  45 30 30 15

## 2021-10-08 ENCOUNTER — Other Ambulatory Visit: Payer: Commercial Managed Care - PPO

## 2021-10-08 ENCOUNTER — Telehealth: Payer: Self-pay | Admitting: Medical Oncology

## 2021-10-08 ENCOUNTER — Telehealth: Payer: Self-pay | Admitting: Emergency Medicine

## 2021-10-08 NOTE — Telephone Encounter (Signed)
Pt asking for [facility] to refill Atorvastatin. I told pt to contact pharmacy or Dr Wynelle Link.

## 2021-10-08 NOTE — Telephone Encounter (Signed)
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  Lurena Joiner 'Warden FillersDairl Ponder, RN, BSN Clinical Research Nurse I 10/08/21 10:19 AM

## 2021-10-09 ENCOUNTER — Telehealth: Payer: Self-pay | Admitting: Emergency Medicine

## 2021-10-09 ENCOUNTER — Other Ambulatory Visit: Payer: Self-pay | Admitting: Internal Medicine

## 2021-10-09 ENCOUNTER — Other Ambulatory Visit: Payer: Self-pay | Admitting: Physician Assistant

## 2021-10-09 DIAGNOSIS — C3491 Malignant neoplasm of unspecified part of right bronchus or lung: Secondary | ICD-10-CM

## 2021-10-09 MED ORDER — DEXAMETHASONE 4 MG PO TABS: ORAL_TABLET | ORAL | 3 refills | Status: DC

## 2021-10-09 NOTE — Telephone Encounter (Signed)
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  He would require a new tumor biopsy which Dr. Arbutus Ped does not feel to be safe at this time.  Patient verbalized understanding that he will not go on trial and will move forward with original plan of treatment.  Pt denies any questions/concerns at this time.  Confirmed appts tomorrow (6/28) are still in place.  Lurena Joiner 'Warden FillersDairl Ponder, RN, BSN Clinical Research Nurse I 10/09/21 10:54 AM

## 2021-10-10 ENCOUNTER — Inpatient Hospital Stay: Payer: Commercial Managed Care - PPO

## 2021-10-10 ENCOUNTER — Other Ambulatory Visit: Payer: Self-pay

## 2021-10-10 ENCOUNTER — Telehealth: Payer: Self-pay | Admitting: Physician Assistant

## 2021-10-10 ENCOUNTER — Other Ambulatory Visit: Payer: Self-pay | Admitting: Physician Assistant

## 2021-10-10 ENCOUNTER — Other Ambulatory Visit: Payer: Self-pay | Admitting: Thoracic Surgery (Cardiothoracic Vascular Surgery)

## 2021-10-10 ENCOUNTER — Telehealth: Payer: Self-pay | Admitting: Internal Medicine

## 2021-10-10 ENCOUNTER — Ambulatory Visit (HOSPITAL_COMMUNITY)
Admission: RE | Admit: 2021-10-10 | Discharge: 2021-10-10 | Disposition: A | Discharge status: Home / Self Care | Payer: Commercial Managed Care - PPO | Source: Ambulatory Visit | Attending: Physician Assistant | Admitting: Physician Assistant

## 2021-10-10 DIAGNOSIS — C3491 Malignant neoplasm of unspecified part of right bronchus or lung: Secondary | ICD-10-CM | POA: Insufficient documentation

## 2021-10-10 DIAGNOSIS — Z95828 Presence of other vascular implants and grafts: Secondary | ICD-10-CM

## 2021-10-10 DIAGNOSIS — H539 Unspecified visual disturbance: Secondary | ICD-10-CM | POA: Diagnosis present

## 2021-10-10 LAB — CBC WITH DIFFERENTIAL (CANCER CENTER ONLY)
Abs Immature Granulocytes: 0.07 10*3/uL (ref 0.00–0.07)
Basophils Absolute: 0 10*3/uL (ref 0.0–0.1)
Basophils Relative: 0 %
Eosinophils Absolute: 0 10*3/uL (ref 0.0–0.5)
Eosinophils Relative: 0 %
HCT: 29.9 % — ABNORMAL LOW (ref 39.0–52.0)
Hemoglobin: 9.9 g/dL — ABNORMAL LOW (ref 13.0–17.0)
Immature Granulocytes: 1 %
Lymphocytes Relative: 16 %
Lymphs Abs: 1.6 10*3/uL (ref 0.7–4.0)
MCH: 27.4 pg (ref 26.0–34.0)
MCHC: 33.1 g/dL (ref 30.0–36.0)
MCV: 82.8 fL (ref 80.0–100.0)
Monocytes Absolute: 1.1 10*3/uL — ABNORMAL HIGH (ref 0.1–1.0)
Monocytes Relative: 11 %
Neutro Abs: 7.1 10*3/uL (ref 1.7–7.7)
Neutrophils Relative %: 72 %
Platelet Count: 170 10*3/uL (ref 150–400)
RBC: 3.61 MIL/uL — ABNORMAL LOW (ref 4.22–5.81)
RDW: 22.5 % — ABNORMAL HIGH (ref 11.5–15.5)
WBC Count: 10 10*3/uL (ref 4.0–10.5)
nRBC: 0 % (ref 0.0–0.2)

## 2021-10-10 LAB — SAMPLE TO BLOOD BANK

## 2021-10-10 LAB — CMP (CANCER CENTER ONLY)
ALT: 24 U/L (ref 0–44)
AST: 37 U/L (ref 15–41)
Albumin: 3.3 g/dL — ABNORMAL LOW (ref 3.5–5.0)
Alkaline Phosphatase: 165 U/L — ABNORMAL HIGH (ref 38–126)
Anion gap: 10 (ref 5–15)
BUN: 20 mg/dL (ref 6–20)
CO2: 27 mmol/L (ref 22–32)
Calcium: 9.7 mg/dL (ref 8.9–10.3)
Chloride: 98 mmol/L (ref 98–111)
Creatinine: 1.57 mg/dL — ABNORMAL HIGH (ref 0.61–1.24)
GFR, Estimated: 53 mL/min — ABNORMAL LOW (ref >60)
Glucose, Bld: 101 mg/dL — ABNORMAL HIGH (ref 70–99)
Potassium: 4.2 mmol/L (ref 3.5–5.1)
Sodium: 135 mmol/L (ref 135–145)
Total Bilirubin: 0.5 mg/dL (ref 0.3–1.2)
Total Protein: 7.9 g/dL (ref 6.5–8.1)

## 2021-10-10 LAB — TOTAL PROTEIN, URINE DIPSTICK: Protein, ur: <30 mg/dL — AB

## 2021-10-10 MED ORDER — HEPARIN SOD (PORK) LOCK FLUSH 100 UNIT/ML IV SOLN
500.0000 [IU] | Freq: Once | INTRAVENOUS | Status: AC
  Administered 2021-10-10: 500 [IU]

## 2021-10-10 MED ORDER — ALPRAZOLAM 0.25 MG PO TABS: 0.2500 mg | ORAL_TABLET | Freq: Every day | ORAL | 0 refills | Status: DC | PRN

## 2021-10-10 MED ORDER — SODIUM CHLORIDE 0.9% FLUSH
10.0000 mL | Freq: Once | INTRAVENOUS | Status: AC
  Administered 2021-10-10: 10 mL

## 2021-10-10 MED ORDER — FILGRASTIM-AAFI 300 MCG/0.5ML IJ SOSY: 300.0000 ug | PREFILLED_SYRINGE | Freq: Every day | INTRAMUSCULAR | Status: DC

## 2021-10-10 NOTE — Telephone Encounter (Signed)
.  Called patient to schedule appointment per 6/26 inbasket, patient is aware of date and time.   

## 2021-10-10 NOTE — Telephone Encounter (Signed)
I called the patient to review his head CT scan which was negative for metastatic disease. He also requested refill of his xanax, I let him know at his last appointment this would be his last refill because I am concerned with him taking too many medications causing drowsiness. Advised to use sparingly. We also discussed the role of constipation education, side effects of his new treatment, and the role of palliative care. I let him know Dr. Julien Nordmann would be seeing him before his starts treatment next week. Encouraged to call or send mychart message for questions.

## 2021-10-10 NOTE — Addendum Note (Signed)
Addended by: Dicie Beam D on: 10/10/2021 03:23 PM   Modules accepted: Orders

## 2021-10-10 NOTE — Addendum Note (Signed)
Addended by: Scot Dock on: 10/10/2021 03:26 PM   Modules accepted: Orders

## 2021-10-11 ENCOUNTER — Telehealth: Payer: Self-pay

## 2021-10-11 ENCOUNTER — Telehealth: Payer: Self-pay | Admitting: Medical Oncology

## 2021-10-11 NOTE — Telephone Encounter (Signed)
Refill requested for MS Contin 15 mg every 12 hr. He is taking 1 tablet in am and 2 tablets pm- this was reduced from his intial MS contin of 30 mg bid.

## 2021-10-11 NOTE — Telephone Encounter (Signed)
Refilled Eliquis per Ellwood Handler, PA through automated refill. Patient should get future refills from PCP per Junie Panning since patient has been released from our practice.

## 2021-10-12 ENCOUNTER — Other Ambulatory Visit: Payer: Self-pay | Admitting: Internal Medicine

## 2021-10-12 MED ORDER — MORPHINE SULFATE ER 15 MG PO TBCR: EXTENDED_RELEASE_TABLET | ORAL | 0 refills | Status: DC

## 2021-10-15 ENCOUNTER — Other Ambulatory Visit: Payer: Self-pay | Admitting: Medical Oncology

## 2021-10-15 ENCOUNTER — Other Ambulatory Visit: Payer: Commercial Managed Care - PPO

## 2021-10-15 NOTE — Telephone Encounter (Signed)
[name] Potts asking for refill for MSContin and Xanax. I told her they were refilled last week and to talk to pharmacist.

## 2021-10-17 ENCOUNTER — Inpatient Hospital Stay: Payer: Commercial Managed Care - PPO

## 2021-10-18 ENCOUNTER — Inpatient Hospital Stay: Payer: Commercial Managed Care - PPO | Attending: Internal Medicine

## 2021-10-18 ENCOUNTER — Inpatient Hospital Stay: Payer: Commercial Managed Care - PPO | Admitting: Internal Medicine

## 2021-10-18 ENCOUNTER — Other Ambulatory Visit: Payer: Self-pay

## 2021-10-18 VITALS — BP 111/71 | HR 101 | Temp 97.1°F | Resp 16 | Wt 166.3 lb

## 2021-10-18 DIAGNOSIS — F909 Attention-deficit hyperactivity disorder, unspecified type: Secondary | ICD-10-CM | POA: Diagnosis not present

## 2021-10-18 DIAGNOSIS — Z5111 Encounter for antineoplastic chemotherapy: Secondary | ICD-10-CM

## 2021-10-18 DIAGNOSIS — M549 Dorsalgia, unspecified: Secondary | ICD-10-CM | POA: Insufficient documentation

## 2021-10-18 DIAGNOSIS — F419 Anxiety disorder, unspecified: Secondary | ICD-10-CM | POA: Diagnosis not present

## 2021-10-18 DIAGNOSIS — Z7901 Long term (current) use of anticoagulants: Secondary | ICD-10-CM | POA: Diagnosis not present

## 2021-10-18 DIAGNOSIS — R140 Abdominal distension (gaseous): Secondary | ICD-10-CM | POA: Diagnosis not present

## 2021-10-18 DIAGNOSIS — R630 Anorexia: Secondary | ICD-10-CM | POA: Diagnosis not present

## 2021-10-18 DIAGNOSIS — C3431 Malignant neoplasm of lower lobe, right bronchus or lung: Secondary | ICD-10-CM | POA: Diagnosis not present

## 2021-10-18 DIAGNOSIS — Z79899 Other long term (current) drug therapy: Secondary | ICD-10-CM | POA: Insufficient documentation

## 2021-10-18 DIAGNOSIS — R21 Rash and other nonspecific skin eruption: Secondary | ICD-10-CM | POA: Insufficient documentation

## 2021-10-18 DIAGNOSIS — R188 Other ascites: Secondary | ICD-10-CM | POA: Diagnosis not present

## 2021-10-18 DIAGNOSIS — Z87891 Personal history of nicotine dependence: Secondary | ICD-10-CM | POA: Insufficient documentation

## 2021-10-18 DIAGNOSIS — D735 Infarction of spleen: Secondary | ICD-10-CM | POA: Diagnosis not present

## 2021-10-18 DIAGNOSIS — D6481 Anemia due to antineoplastic chemotherapy: Secondary | ICD-10-CM

## 2021-10-18 DIAGNOSIS — C3491 Malignant neoplasm of unspecified part of right bronchus or lung: Secondary | ICD-10-CM

## 2021-10-18 DIAGNOSIS — K5900 Constipation, unspecified: Secondary | ICD-10-CM | POA: Insufficient documentation

## 2021-10-18 DIAGNOSIS — T451X5A Adverse effect of antineoplastic and immunosuppressive drugs, initial encounter: Secondary | ICD-10-CM | POA: Diagnosis not present

## 2021-10-18 DIAGNOSIS — Z7952 Long term (current) use of systemic steroids: Secondary | ICD-10-CM | POA: Diagnosis not present

## 2021-10-18 DIAGNOSIS — Z86718 Personal history of other venous thrombosis and embolism: Secondary | ICD-10-CM | POA: Diagnosis not present

## 2021-10-18 DIAGNOSIS — Z95828 Presence of other vascular implants and grafts: Secondary | ICD-10-CM

## 2021-10-18 DIAGNOSIS — R634 Abnormal weight loss: Secondary | ICD-10-CM | POA: Insufficient documentation

## 2021-10-18 DIAGNOSIS — G893 Neoplasm related pain (acute) (chronic): Secondary | ICD-10-CM | POA: Diagnosis not present

## 2021-10-18 LAB — CBC WITH DIFFERENTIAL (CANCER CENTER ONLY)
Abs Immature Granulocytes: 0.06 10*3/uL (ref 0.00–0.07)
Basophils Absolute: 0 10*3/uL (ref 0.0–0.1)
Basophils Relative: 0 %
Eosinophils Absolute: 0 10*3/uL (ref 0.0–0.5)
Eosinophils Relative: 0 %
HCT: 29.2 % — ABNORMAL LOW (ref 39.0–52.0)
Hemoglobin: 9.5 g/dL — ABNORMAL LOW (ref 13.0–17.0)
Immature Granulocytes: 1 %
Lymphocytes Relative: 7 %
Lymphs Abs: 0.6 10*3/uL — ABNORMAL LOW (ref 0.7–4.0)
MCH: 27.5 pg (ref 26.0–34.0)
MCHC: 32.5 g/dL (ref 30.0–36.0)
MCV: 84.4 fL (ref 80.0–100.0)
Monocytes Absolute: 0.5 10*3/uL (ref 0.1–1.0)
Monocytes Relative: 5 %
Neutro Abs: 8.2 10*3/uL — ABNORMAL HIGH (ref 1.7–7.7)
Neutrophils Relative %: 87 %
Platelet Count: 203 10*3/uL (ref 150–400)
RBC: 3.46 MIL/uL — ABNORMAL LOW (ref 4.22–5.81)
RDW: 23.9 % — ABNORMAL HIGH (ref 11.5–15.5)
WBC Count: 9.4 10*3/uL (ref 4.0–10.5)
nRBC: 0 % (ref 0.0–0.2)

## 2021-10-18 LAB — SAMPLE TO BLOOD BANK

## 2021-10-18 LAB — CMP (CANCER CENTER ONLY)
ALT: 17 U/L (ref 0–44)
AST: 28 U/L (ref 15–41)
Albumin: 3.7 g/dL (ref 3.5–5.0)
Alkaline Phosphatase: 155 U/L — ABNORMAL HIGH (ref 38–126)
Anion gap: 9 (ref 5–15)
BUN: 20 mg/dL (ref 6–20)
CO2: 26 mmol/L (ref 22–32)
Calcium: 9.2 mg/dL (ref 8.9–10.3)
Chloride: 99 mmol/L (ref 98–111)
Creatinine: 1.29 mg/dL — ABNORMAL HIGH (ref 0.61–1.24)
GFR, Estimated: >60 mL/min (ref >60)
Glucose, Bld: 148 mg/dL — ABNORMAL HIGH (ref 70–99)
Potassium: 4.1 mmol/L (ref 3.5–5.1)
Sodium: 134 mmol/L — ABNORMAL LOW (ref 135–145)
Total Bilirubin: 0.6 mg/dL (ref 0.3–1.2)
Total Protein: 7.5 g/dL (ref 6.5–8.1)

## 2021-10-18 MED ORDER — DRONABINOL 2.5 MG PO CAPS: 2.5000 mg | ORAL_CAPSULE | Freq: Two times a day (BID) | ORAL | 0 refills | Status: DC

## 2021-10-18 MED ORDER — SODIUM CHLORIDE 0.9% FLUSH
10.0000 mL | Freq: Once | INTRAVENOUS | Status: AC
  Administered 2021-10-18: 10 mL

## 2021-10-18 MED ORDER — HEPARIN SOD (PORK) LOCK FLUSH 100 UNIT/ML IV SOLN
500.0000 [IU] | Freq: Once | INTRAVENOUS | Status: AC
  Administered 2021-10-18: 500 [IU]

## 2021-10-18 MED FILL — Dexamethasone Sodium Phosphate Inj 100 MG/10ML: INTRAMUSCULAR | Qty: 1 | Status: AC

## 2021-10-18 NOTE — Progress Notes (Signed)
[facility] [phone]   [phone]  OFFICE PROGRESS NOTE  [name], MD [place] [address]  DIAGNOSIS: Stage IV (T1b, N2, M1 a) non-small cell lung cancer, adenocarcinoma presented with right lower lobe lung nodule in addition to right paratracheal and subcarinal lymphadenopathy and malignant right pleural effusion as well as pleural metastatic disease diagnosed in March 2023.  His molecular studies showed no actionable mutation and PD-L1 expression was 1%. It showed KRAS G12D, Myc amplification, CCND3 amplification, TP53 R248L, VEGFA amplification but these are not actionable at this time.   PRIOR THERAPY: carboplatin for AUC of 5, Alimta 500 Mg/M2 and Keytruda 200 Mg IV every 3 weeks. First dose on 08/01/21. Status post 2 cycles. Alimta dose reduced to 400 mg/m2 starting from cycle #3.    CURRENT THERAPY: Second line systemic chemotherapy with docetaxel 65 Mg/M2 and Cyramza 10 Mg/KG every 3 weeks with Neulasta support.  First dose 10/19/2021.  INTERVAL HISTORY: [name] Potts 51 y.o. male returns to the clinic today for follow-up visit accompanied by his wife.  The patient continues to complain of poor appetite and weight loss.  He also has mild numbness in the hands.  He denied having any current chest pain but has shortness of breath with exertion with mild cough and no hemoptysis.  He denied having any fever or chills.  He has no current nausea, vomiting, diarrhea or constipation.  He has no headache or visual changes.  He was referred for consideration of treatment on a clinical trial with the [employer] clinical trial with cobolimab and Dosartlimab but unfortunately patient was not a candidate for the trial.  He is here today for reevaluation before consideration of treatment with second line systemic chemotherapy with docetaxel and Cyramza.  MEDICAL HISTORY: Past Medical History:  Diagnosis Date   ADHD (attention deficit  hyperactivity disorder)    Chronic back pain    Complication of anesthesia    woke up during 1 of 3 colonoscopies   Constipation    Dyspnea    Pneumonia    Seasonal allergies     ALLERGIES:  has No Known Allergies.  MEDICATIONS:  Current Outpatient Medications  Medication Sig Dispense Refill   ALPRAZolam (XANAX) 0.25 MG tablet Take 1 tablet (0.25 mg total) by mouth daily as needed for anxiety. 20 tablet 0   apixaban (ELIQUIS) 5 MG TABS tablet Take 1 tablet (5 mg total) by mouth 2 (two) times daily. 60 tablet 1   atorvastatin (LIPITOR) 40 MG tablet Take 1 tablet (40 mg total) by mouth daily. 90 tablet 1   dexamethasone (DECADRON) 4 MG tablet Please take two tablets twice a day the day before chemo, the day of chemotherapy, and the day after chemotherapy 40 tablet 3   FeFum-FePoly-FA-B Cmp-C-Biot (INTEGRA PLUS) CAPS Take 1 capsule by mouth daily. 30 capsule 3   folic acid (FOLVITE) 1 MG tablet Take 1 tablet (1 mg total) by mouth daily. 30 tablet 4   lidocaine-prilocaine (EMLA) cream Apply to the Port-A-Cath site 30 minutes before chemotherapy 30 g 0   mirtazapine (REMERON) 30 MG tablet Take 1 tablet (30 mg total) by mouth at bedtime. 30 tablet 2   morphine (MS CONTIN) 15 MG 12 hr tablet 1 tablet p.o. in a.m. and 2 tablets p.o. p.m. 90 tablet 0   oxyCODONE (OXY IR/ROXICODONE) 5 MG immediate release tablet Take 1 tablet (5 mg total) by mouth every 6 (six) hours as needed  for moderate pain or severe pain. 30 tablet 0   polyethylene glycol (MIRALAX / GLYCOLAX) 17 g packet Take 17 g by mouth daily as needed for mild constipation. 14 each 0   prochlorperazine (COMPAZINE) 10 MG tablet TAKE 1 TABLET BY MOUTH EVERY 6 HOURS AS NEEDED FOR NAUSEA FOR VOMITING 30 tablet 2   No current facility-administered medications for this visit.    SURGICAL HISTORY:  Past Surgical History:  Procedure Laterality Date   COLONOSCOPY  10/11/2011   Procedure: COLONOSCOPY;  Surgeon: Daneil Dolin, MD;  Location:  [facility];  Service: Endoscopy;  Laterality: N/A;  1:45 PM   COLONOSCOPY N/A 01/23/2018   Procedure: COLONOSCOPY;  Surgeon: Daneil Dolin, MD;  Location: [facility];  Service: Endoscopy;  Laterality: N/A;  1:00   IR IMAGING GUIDED PORT INSERTION  07/27/2021   NECK SURGERY     Apr 05, 2021- per pt report cervical surgery at day surgery center   POLYPECTOMY  01/23/2018   Procedure: POLYPECTOMY;  Surgeon: Daneil Dolin, MD;  Location: [facility];  Service: Endoscopy;;   VIDEO ASSISTED THORACOSCOPY (VATS)/DECORTICATION Right 06/27/2021   Procedure: VIDEO ASSISTED THORACOSCOPY (VATS), Drainage of RIGHT pleural effusion/ DECORTICATION;  Surgeon: Melrose Nakayama, MD;  Location: [facility];  Service: Thoracic;  Laterality: Right;    REVIEW OF SYSTEMS:  Constitutional: positive for anorexia, fatigue, and weight loss Eyes: negative Ears, nose, mouth, throat, and face: negative Respiratory: positive for dyspnea on exertion Cardiovascular: negative Gastrointestinal: negative Genitourinary:negative Integument/breast: negative Hematologic/lymphatic: negative Musculoskeletal:positive for muscle weakness Neurological: negative Behavioral/Psych: negative Endocrine: negative Allergic/Immunologic: negative   PHYSICAL EXAMINATION: General appearance: alert, cooperative, fatigued, and no distress Head: Normocephalic, without obvious abnormality, atraumatic Neck: no adenopathy, no JVD, supple, symmetrical, trachea midline, and thyroid not enlarged, symmetric, no tenderness/mass/nodules Lymph nodes: Cervical, supraclavicular, and axillary nodes normal. Resp: clear to auscultation bilaterally Back: symmetric, no curvature. ROM normal. No CVA tenderness. Cardio: regular rate and rhythm, S1, S2 normal, no murmur, click, rub or gallop GI: soft, non-tender; bowel sounds normal; no masses,  no organomegaly Extremities: extremities normal, atraumatic, no cyanosis or edema Neurologic: Alert and  oriented X 3, normal strength and tone. Normal symmetric reflexes. Normal coordination and gait  ECOG PERFORMANCE STATUS: 1 - Symptomatic but completely ambulatory  Blood pressure 111/71, pulse (!) 101, temperature (!) 97.1 F (36.2 C), temperature source Tympanic, resp. rate 16, weight 166 lb 5 oz (75.4 kg), SpO2 97 %.  LABORATORY DATA: Lab Results  Component Value Date   WBC 9.4 10/18/2021   HGB 9.5 (L) 10/18/2021   HCT 29.2 (L) 10/18/2021   MCV 84.4 10/18/2021   PLT 203 10/18/2021      Chemistry      Component Value Date/Time   NA 134 (L) 10/18/2021 0917   NA 140 11/22/2020 1644   K 4.1 10/18/2021 0917   CL 99 10/18/2021 0917   CO2 26 10/18/2021 0917   BUN 20 10/18/2021 0917   BUN 13 11/22/2020 1644   CREATININE 1.29 (H) 10/18/2021 0917      Component Value Date/Time   CALCIUM 9.2 10/18/2021 0917   ALKPHOS 155 (H) 10/18/2021 0917   AST 28 10/18/2021 0917   ALT 17 10/18/2021 0917   BILITOT 0.6 10/18/2021 0917       RADIOGRAPHIC STUDIES: CT HEAD WO CONTRAST (5MM)  Result Date: 10/10/2021 CLINICAL DATA:  Adenocarcinoma of the right lung. Visual disturbance. Question metastatic disease. EXAM: CT HEAD WITHOUT CONTRAST TECHNIQUE: Contiguous axial  images were obtained from the base of the skull through the vertex without intravenous contrast. RADIATION DOSE REDUCTION: This exam was performed according to the departmental dose-optimization program which includes automated exposure control, adjustment of the mA and/or kV according to patient size and/or use of iterative reconstruction technique. COMPARISON:  08/31/2021 FINDINGS: Brain: Normal appearance without contrast. No evidence of old or acute infarction, mass lesion, hemorrhage, hydrocephalus or extra-axial collection. Sensitivity to small metastatic lesions is decreased by the absence of contrast. Vascular: No abnormal vascular finding. Skull: Negative Sinuses/Orbits: Clear/normal Other: None IMPRESSION: Normal noncontrast  head CT. No sign of metastatic disease. Sensitivity to small lesions is reduced given the absence of contrast administration. Electronically Signed   By: Nelson Chimes M.D.   On: 10/10/2021 15:39   CT Chest W Contrast  Result Date: 09/27/2021 CLINICAL DATA:  Non-small-cell lung cancer. Restaging. Vats on 06/27/2021. 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 Status post chemotherapy. * Tracking Code: BO * EXAM: CT CHEST, ABDOMEN, AND PELVIS WITH CONTRAST TECHNIQUE: Multidetector CT imaging of the chest, abdomen and pelvis was performed following the standard protocol during bolus administration of intravenous contrast. RADIATION DOSE REDUCTION: This exam was performed according to the departmental dose-optimization program which includes automated exposure control, adjustment of the mA and/or kV according to patient size and/or use of iterative reconstruction technique. CONTRAST:  159mL OMNIPAQUE IOHEXOL 300 MG/ML  SOLN COMPARISON:  Clinic note of 09/12/2021.  PET of 07/20/2021. FINDINGS: CT CHEST FINDINGS Cardiovascular: Left Port-A-Cath tip high right atrium. Aortic atherosclerosis. Normal heart size, without pericardial effusion. No central pulmonary embolism, on this non-dedicated study. Mediastinum/Nodes: No supraclavicular adenopathy. The right paratracheal node of interest on the prior PET measures 7 mm on 13/2 today versus 8 mm previously. Subcarinal node measures 9 mm on 31/2 and is not significantly changed. No hilar adenopathy. Lungs/Pleura: Again identified is a small to moderate volume right-sided hydropneumothorax, with pleural air loculated inferiorly and anteriorly. The small pleural fluid component is similar. Circumferential pleural thickening persists. Example anteromedially at 11 mm on 25/2 versus 9 mm on the prior PET when measured in a similar level. Mild centrilobular emphysema. Patent airways. Slightly improved right lower lobe aeration with compressive  atelectasis remaining. This may obscure the previously described hypermetabolic right lower lobe nodule. New right apical pleural-based soft tissue density measures 2.6 x 1.8 cm on 37/4 and 15/2. Musculoskeletal: No acute osseous abnormality. Cervical spine fixation. CT ABDOMEN PELVIS FINDINGS Hepatobiliary: A too small to characterize subcentimeter hypoattenuating right liver lobe lesion on 62/2 is similar to on the prior exam, favoring a cyst. Normal gallbladder, without biliary ductal dilatation. Pancreas: Normal, without mass or ductal dilatation. Spleen: Small volume superior splenic infarct on 58/2 is not readily apparent on the prior noncontrast nondedicated CT from PET. smaller more caudal splenic infarct. No splenomegaly. No perisplenic fluid. Adrenals/Urinary Tract: Normal adrenal glands. Bilateral renal too small to characterize lesions. Lower pole right renal 4.3 cm cyst or minimally complex cyst . In the absence of clinically indicated signs/symptoms require(s) no independent follow-up. No hydronephrosis. Normal urinary bladder. Stomach/Bowel: Normal colon and terminal ileum. Moderate amount of stool within the rectum. Normal terminal ileum. The appendix is prominent including at 8 mm on coronal image 103. Enlarged since the prior. Normal small bowel. Vascular/Lymphatic: Aortic atherosclerosis. No abdominopelvic adenopathy. Reproductive: Normal prostate. Other: No significant free fluid. There is new peritoneal thickening within the pelvis, which is relatively symmetric on 107/2. Suspicion of subtle omental nodularity within the  left side of the abdomen on 84/2. Felt to be new since the prior PET. No free intraperitoneal air. Tiny bilateral fat containing inguinal hernias. Musculoskeletal: Tiny right iliac sclerotic lesion is similar and likely a bone island. Lumbosacral spondylosis with S shaped thoracolumbar spine curvature. IMPRESSION: CT CHEST IMPRESSION 1. Since the PET of 07/20/2021, relatively  similar appearance of the right pleural space, with small volume hydropneumothorax and circumferential pleural thickening, likely related to pleural metastasis. 2. Improved but persistent right lower lobe atelectasis, which may obscure the previously described hypermetabolic right lower lobe pulmonary nodule. 3. New 2.6 cm area of right apical pleuroparenchymal soft tissue density medially could represent progressive pleural disease or a developing lung metastasis. 4. Similar to decreased size of previously hypermetabolic thoracic nodes. 5. Aortic atherosclerosis (ICD10-I70.0) and emphysema (ICD10-J43.9). CT ABDOMEN AND PELVIS IMPRESSION 1. Development of subtle pelvic peritoneal thickening and possible omental nodularity. Findings are suspicious for omental/peritoneal metastasis. 2. New borderline enlargement of the appendix. Correlate with right lower quadrant symptoms, as indolent appendicitis could less likely explain the symmetric pelvic peritoneal thickening. 3. No other evidence of metastatic disease within the abdomen or pelvis. 4. Small volume splenic infarcts, of indeterminate acuity. 5.  Possible constipation. These results will be called to the ordering clinician or representative by the Radiologist Assistant, and communication documented in the PACS or [employer]. Electronically Signed   By: Abigail Miyamoto M.D.   On: 09/27/2021 12:31   CT Abdomen Pelvis W Contrast  Result Date: 09/27/2021 CLINICAL DATA:  Non-small-cell lung cancer. Restaging. Vats on 06/27/2021. 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 Status post chemotherapy. * Tracking Code: BO * EXAM: CT CHEST, ABDOMEN, AND PELVIS WITH CONTRAST TECHNIQUE: Multidetector CT imaging of the chest, abdomen and pelvis was performed following the standard protocol during bolus administration of intravenous contrast. RADIATION DOSE REDUCTION: This exam was performed according to the departmental dose-optimization  program which includes automated exposure control, adjustment of the mA and/or kV according to patient size and/or use of iterative reconstruction technique. CONTRAST:  164mL OMNIPAQUE IOHEXOL 300 MG/ML  SOLN COMPARISON:  Clinic note of 09/12/2021.  PET of 07/20/2021. FINDINGS: CT CHEST FINDINGS Cardiovascular: Left Port-A-Cath tip high right atrium. Aortic atherosclerosis. Normal heart size, without pericardial effusion. No central pulmonary embolism, on this non-dedicated study. Mediastinum/Nodes: No supraclavicular adenopathy. The right paratracheal node of interest on the prior PET measures 7 mm on 13/2 today versus 8 mm previously. Subcarinal node measures 9 mm on 31/2 and is not significantly changed. No hilar adenopathy. Lungs/Pleura: Again identified is a small to moderate volume right-sided hydropneumothorax, with pleural air loculated inferiorly and anteriorly. The small pleural fluid component is similar. Circumferential pleural thickening persists. Example anteromedially at 11 mm on 25/2 versus 9 mm on the prior PET when measured in a similar level. Mild centrilobular emphysema. Patent airways. Slightly improved right lower lobe aeration with compressive atelectasis remaining. This may obscure the previously described hypermetabolic right lower lobe nodule. New right apical pleural-based soft tissue density measures 2.6 x 1.8 cm on 37/4 and 15/2. Musculoskeletal: No acute osseous abnormality. Cervical spine fixation. CT ABDOMEN PELVIS FINDINGS Hepatobiliary: A too small to characterize subcentimeter hypoattenuating right liver lobe lesion on 62/2 is similar to on the prior exam, favoring a cyst. Normal gallbladder, without biliary ductal dilatation. Pancreas: Normal, without mass or ductal dilatation. Spleen: Small volume superior splenic infarct on 58/2 is not readily apparent on the prior noncontrast nondedicated CT from PET. smaller more caudal splenic  infarct. No splenomegaly. No perisplenic fluid.  Adrenals/Urinary Tract: Normal adrenal glands. Bilateral renal too small to characterize lesions. Lower pole right renal 4.3 cm cyst or minimally complex cyst . In the absence of clinically indicated signs/symptoms require(s) no independent follow-up. No hydronephrosis. Normal urinary bladder. Stomach/Bowel: Normal colon and terminal ileum. Moderate amount of stool within the rectum. Normal terminal ileum. The appendix is prominent including at 8 mm on coronal image 103. Enlarged since the prior. Normal small bowel. Vascular/Lymphatic: Aortic atherosclerosis. No abdominopelvic adenopathy. Reproductive: Normal prostate. Other: No significant free fluid. There is new peritoneal thickening within the pelvis, which is relatively symmetric on 107/2. Suspicion of subtle omental nodularity within the left side of the abdomen on 84/2. Felt to be new since the prior PET. No free intraperitoneal air. Tiny bilateral fat containing inguinal hernias. Musculoskeletal: Tiny right iliac sclerotic lesion is similar and likely a bone island. Lumbosacral spondylosis with S shaped thoracolumbar spine curvature. IMPRESSION: CT CHEST IMPRESSION 1. Since the PET of 07/20/2021, relatively similar appearance of the right pleural space, with small volume hydropneumothorax and circumferential pleural thickening, likely related to pleural metastasis. 2. Improved but persistent right lower lobe atelectasis, which may obscure the previously described hypermetabolic right lower lobe pulmonary nodule. 3. New 2.6 cm area of right apical pleuroparenchymal soft tissue density medially could represent progressive pleural disease or a developing lung metastasis. 4. Similar to decreased size of previously hypermetabolic thoracic nodes. 5. Aortic atherosclerosis (ICD10-I70.0) and emphysema (ICD10-J43.9). CT ABDOMEN AND PELVIS IMPRESSION 1. Development of subtle pelvic peritoneal thickening and possible omental nodularity. Findings are suspicious for  omental/peritoneal metastasis. 2. New borderline enlargement of the appendix. Correlate with right lower quadrant symptoms, as indolent appendicitis could less likely explain the symmetric pelvic peritoneal thickening. 3. No other evidence of metastatic disease within the abdomen or pelvis. 4. Small volume splenic infarcts, of indeterminate acuity. 5.  Possible constipation. These results will be called to the ordering clinician or representative by the Radiologist Assistant, and communication documented in the PACS or [employer]. Electronically Signed   By: Abigail Miyamoto M.D.   On: 09/27/2021 12:31    ASSESSMENT AND PLAN: This is a very pleasant 51 years old white male with stage IV (T1b, N2, M1 a) non-small cell lung cancer, adenocarcinoma presented with right lower lobe lung nodule in addition to right paratracheal and subcarinal lymphadenopathy and malignant right pleural effusion as well as pleural metastatic disease diagnosed in March 2023. The patient has no actionable mutations and PD-L1 expression was 1%. He is currently undergoing systemic chemotherapy with carboplatin for AUC of 5, Alimta 400 Mg/M2 and Keytruda 200 Mg IV every 3 weeks.  Status post 3 cycles.  His dose of Alimta was reduced to 400 Mg/M2 starting from cycle #2 secondary to renal insufficiency and intolerance.  He has a rough time with this treatment with increasing fatigue and weakness as well as pancytopenia. The patient was considered for enrollment in a clinical trial with the [facility] [facility] regimen with cobolimab and Dosartlimab but unfortunately he was not a good candidate for the trial because of the tissue requirement. He is here today for evaluation and other treatment options. I recommended for the patient treatment with second line systemic chemotherapy with reduced dose docetaxel 65 Mg/M2 and Cyramza 10 Mg/KG every 3 weeks with Neulasta support. I also gave the patient the option of palliative care and hospice.  The  patient and his wife are interested in treatment.  I also had a lengthy  discussion with them about his current disease condition and prognosis with and without treatment. They are interested in proceeding with the treatment. I reminded the patient of the adverse effect of this treatment including but not limited to alopecia, myelosuppression, nausea and vomiting, peripheral neuropathy, liver or renal dysfunction as well as paronychia.  He was also reminded of the hemorrhagic adverse effect of Cyramza. He is expected to start the first cycle of this treatment tomorrow. He will come back for follow-up visit in 1 week for evaluation and management of any adverse effect of this treatment. For the pain management, he will continue his current treatment with MS Contin and oxycodone for breakthrough pain management. For the weight loss and lack of appetite he is currently on Remeron and I will also add Marinol 2.5 mg p.o. twice daily to his regimen. For the anxiety he is on Xanax 0.25 mg p.o. nightly as needed. The patient and his wife had several questions and I gave them the time to ask question and answered them completely to their satisfaction. The patient was advised to call immediately if he has any other concerning symptoms in the interval. The total time spent in the appointment was 55 minutes.  Disclaimer: This note was dictated with voice recognition software. Similar sounding words can inadvertently be transcribed and may not be corrected upon review.

## 2021-10-19 ENCOUNTER — Other Ambulatory Visit: Payer: Self-pay | Admitting: Physician Assistant

## 2021-10-19 ENCOUNTER — Telehealth: Payer: Self-pay | Admitting: Internal Medicine

## 2021-10-19 ENCOUNTER — Inpatient Hospital Stay: Payer: Commercial Managed Care - PPO

## 2021-10-19 NOTE — Telephone Encounter (Signed)
Scheduled per 07/06 los, patient has been called and notified of upcoming appointments.

## 2021-10-22 ENCOUNTER — Inpatient Hospital Stay: Payer: Commercial Managed Care - PPO

## 2021-10-22 ENCOUNTER — Inpatient Hospital Stay: Payer: Commercial Managed Care - PPO | Admitting: Nurse Practitioner

## 2021-10-22 ENCOUNTER — Encounter: Payer: Self-pay | Admitting: *Deleted

## 2021-10-22 ENCOUNTER — Other Ambulatory Visit: Payer: Commercial Managed Care - PPO

## 2021-10-22 ENCOUNTER — Ambulatory Visit: Payer: Commercial Managed Care - PPO

## 2021-10-22 ENCOUNTER — Ambulatory Visit: Payer: Commercial Managed Care - PPO | Admitting: Internal Medicine

## 2021-10-23 ENCOUNTER — Other Ambulatory Visit: Payer: Self-pay | Admitting: Internal Medicine

## 2021-10-23 MED FILL — Dexamethasone Sodium Phosphate Inj 100 MG/10ML: INTRAMUSCULAR | Qty: 1 | Status: AC

## 2021-10-24 ENCOUNTER — Inpatient Hospital Stay: Payer: Commercial Managed Care - PPO

## 2021-10-24 ENCOUNTER — Other Ambulatory Visit: Payer: Self-pay

## 2021-10-24 ENCOUNTER — Encounter: Payer: Self-pay | Admitting: Nurse Practitioner

## 2021-10-24 ENCOUNTER — Inpatient Hospital Stay (HOSPITAL_BASED_OUTPATIENT_CLINIC_OR_DEPARTMENT_OTHER): Payer: Commercial Managed Care - PPO | Admitting: Nurse Practitioner

## 2021-10-24 ENCOUNTER — Inpatient Hospital Stay: Payer: Commercial Managed Care - PPO | Admitting: Internal Medicine

## 2021-10-24 VITALS — BP 130/77 | HR 80 | Temp 97.9°F | Resp 17

## 2021-10-24 VITALS — BP 138/94 | HR 102 | Temp 97.9°F | Resp 16 | Wt 170.0 lb

## 2021-10-24 DIAGNOSIS — G893 Neoplasm related pain (acute) (chronic): Secondary | ICD-10-CM

## 2021-10-24 DIAGNOSIS — C3431 Malignant neoplasm of lower lobe, right bronchus or lung: Secondary | ICD-10-CM | POA: Diagnosis not present

## 2021-10-24 DIAGNOSIS — Z7189 Other specified counseling: Secondary | ICD-10-CM

## 2021-10-24 DIAGNOSIS — R630 Anorexia: Secondary | ICD-10-CM

## 2021-10-24 DIAGNOSIS — Z515 Encounter for palliative care: Secondary | ICD-10-CM | POA: Diagnosis not present

## 2021-10-24 DIAGNOSIS — C3491 Malignant neoplasm of unspecified part of right bronchus or lung: Secondary | ICD-10-CM

## 2021-10-24 DIAGNOSIS — K5903 Drug induced constipation: Secondary | ICD-10-CM | POA: Diagnosis not present

## 2021-10-24 DIAGNOSIS — R530 Neoplastic (malignant) related fatigue: Secondary | ICD-10-CM

## 2021-10-24 DIAGNOSIS — Z95828 Presence of other vascular implants and grafts: Secondary | ICD-10-CM

## 2021-10-24 LAB — CBC WITH DIFFERENTIAL (CANCER CENTER ONLY)
Abs Immature Granulocytes: 0.18 10*3/uL — ABNORMAL HIGH (ref 0.00–0.07)
Basophils Absolute: 0 10*3/uL (ref 0.0–0.1)
Basophils Relative: 0 %
Eosinophils Absolute: 0 10*3/uL (ref 0.0–0.5)
Eosinophils Relative: 0 %
HCT: 28.8 % — ABNORMAL LOW (ref 39.0–52.0)
Hemoglobin: 9.8 g/dL — ABNORMAL LOW (ref 13.0–17.0)
Immature Granulocytes: 1 %
Lymphocytes Relative: 5 %
Lymphs Abs: 0.9 10*3/uL (ref 0.7–4.0)
MCH: 28.7 pg (ref 26.0–34.0)
MCHC: 34 g/dL (ref 30.0–36.0)
MCV: 84.5 fL (ref 80.0–100.0)
Monocytes Absolute: 1.1 10*3/uL — ABNORMAL HIGH (ref 0.1–1.0)
Monocytes Relative: 6 %
Neutro Abs: 17.8 10*3/uL — ABNORMAL HIGH (ref 1.7–7.7)
Neutrophils Relative %: 88 %
Platelet Count: 133 10*3/uL — ABNORMAL LOW (ref 150–400)
RBC: 3.41 MIL/uL — ABNORMAL LOW (ref 4.22–5.81)
RDW: 24.8 % — ABNORMAL HIGH (ref 11.5–15.5)
WBC Count: 20 10*3/uL — ABNORMAL HIGH (ref 4.0–10.5)
nRBC: 0 % (ref 0.0–0.2)

## 2021-10-24 LAB — CMP (CANCER CENTER ONLY)
ALT: 25 U/L (ref 0–44)
AST: 32 U/L (ref 15–41)
Albumin: 3.7 g/dL (ref 3.5–5.0)
Alkaline Phosphatase: 133 U/L — ABNORMAL HIGH (ref 38–126)
Anion gap: 9 (ref 5–15)
BUN: 29 mg/dL — ABNORMAL HIGH (ref 6–20)
CO2: 25 mmol/L (ref 22–32)
Calcium: 9.4 mg/dL (ref 8.9–10.3)
Chloride: 102 mmol/L (ref 98–111)
Creatinine: 1.26 mg/dL — ABNORMAL HIGH (ref 0.61–1.24)
GFR, Estimated: >60 mL/min (ref >60)
Glucose, Bld: 151 mg/dL — ABNORMAL HIGH (ref 70–99)
Potassium: 3.8 mmol/L (ref 3.5–5.1)
Sodium: 136 mmol/L (ref 135–145)
Total Bilirubin: 0.5 mg/dL (ref 0.3–1.2)
Total Protein: 7.3 g/dL (ref 6.5–8.1)

## 2021-10-24 LAB — TOTAL PROTEIN, URINE DIPSTICK: Protein, ur: <30 mg/dL — AB

## 2021-10-24 MED ORDER — SODIUM CHLORIDE 0.9% FLUSH
10.0000 mL | Freq: Once | INTRAVENOUS | Status: AC
  Administered 2021-10-24: 10 mL

## 2021-10-24 MED ORDER — SODIUM CHLORIDE 0.9 % IV SOLN
65.0000 mg/m2 | Freq: Once | INTRAVENOUS | Status: AC
  Administered 2021-10-24: 130 mg via INTRAVENOUS
  Filled 2021-10-24: qty 13

## 2021-10-24 MED ORDER — SODIUM CHLORIDE 0.9 % IV SOLN
10.0000 mg/kg | Freq: Once | INTRAVENOUS | Status: AC
  Administered 2021-10-24: 800 mg via INTRAVENOUS
  Filled 2021-10-24: qty 50

## 2021-10-24 MED ORDER — SODIUM CHLORIDE 0.9 % IV SOLN
10.0000 mg | Freq: Once | INTRAVENOUS | Status: AC
  Administered 2021-10-24: 10 mg via INTRAVENOUS
  Filled 2021-10-24: qty 10

## 2021-10-24 MED ORDER — OXYCODONE HCL 5 MG PO TABS: 5.0000 mg | ORAL_TABLET | Freq: Four times a day (QID) | ORAL | 0 refills | Status: AC | PRN

## 2021-10-24 MED ORDER — SODIUM CHLORIDE 0.9 % IV SOLN: Freq: Once | INTRAVENOUS | Status: AC

## 2021-10-24 MED ORDER — DIPHENHYDRAMINE HCL 50 MG/ML IJ SOLN
50.0000 mg | Freq: Once | INTRAMUSCULAR | Status: AC
  Administered 2021-10-24: 50 mg via INTRAVENOUS
  Filled 2021-10-24: qty 1

## 2021-10-24 MED ORDER — SODIUM CHLORIDE 0.9% FLUSH
10.0000 mL | INTRAVENOUS | Status: DC | PRN
  Administered 2021-10-24: 10 mL

## 2021-10-24 MED ORDER — DIAZEPAM 2 MG PO TABS: 2.0000 mg | ORAL_TABLET | Freq: Two times a day (BID) | ORAL | 0 refills | Status: DC | PRN

## 2021-10-24 MED ORDER — HEPARIN SOD (PORK) LOCK FLUSH 100 UNIT/ML IV SOLN
500.0000 [IU] | Freq: Once | INTRAVENOUS | Status: AC | PRN
  Administered 2021-10-24: 500 [IU]

## 2021-10-24 MED ORDER — ACETAMINOPHEN 325 MG PO TABS
650.0000 mg | ORAL_TABLET | Freq: Once | ORAL | Status: AC
  Administered 2021-10-24: 650 mg via ORAL
  Filled 2021-10-24: qty 2

## 2021-10-24 NOTE — Patient Instructions (Signed)
[facility]  Discharge Instructions: Thank you for choosing [facility] to provide your oncology and hematology care.   If you have a lab appointment with the [facility], please go directly to the [facility] and check in at the registration area.   Wear comfortable clothing and clothing appropriate for easy access to any Portacath or PICC line.   We strive to give you quality time with your provider. You may need to reschedule your appointment if you arrive late (15 or more minutes).  Arriving late affects you and other patients whose appointments are after yours.  Also, if you miss three or more appointments without notifying the office, you may be dismissed from the clinic at the provider's discretion.      For prescription refill requests, have your pharmacy contact our office and allow 72 hours for refills to be completed.    Today you received the following chemotherapy and/or immunotherapy agents: Cyramza, taxol      To help prevent nausea and vomiting after your treatment, we encourage you to take your nausea medication as directed.  BELOW ARE SYMPTOMS THAT SHOULD BE REPORTED IMMEDIATELY: *FEVER GREATER THAN 100.4 F (38 C) OR HIGHER *CHILLS OR SWEATING *NAUSEA AND VOMITING THAT IS NOT CONTROLLED WITH YOUR NAUSEA MEDICATION *UNUSUAL SHORTNESS OF BREATH *UNUSUAL BRUISING OR BLEEDING *URINARY PROBLEMS (pain or burning when urinating, or frequent urination) *BOWEL PROBLEMS (unusual diarrhea, constipation, pain near the anus) TENDERNESS IN MOUTH AND THROAT WITH OR WITHOUT PRESENCE OF ULCERS (sore throat, sores in mouth, or a toothache) UNUSUAL RASH, SWELLING OR PAIN  UNUSUAL VAGINAL DISCHARGE OR ITCHING   Items with * indicate a potential emergency and should be followed up as soon as possible or go to the Emergency Department if any problems should occur.  Please show the CHEMOTHERAPY ALERT CARD or IMMUNOTHERAPY ALERT CARD at  check-in to the Emergency Department and triage nurse.  Should you have questions after your visit or need to cancel or reschedule your appointment, please contact [facility]  Dept: [phone]  and follow the prompts.  Office hours are 8:00 a.m. to 4:30 p.m. Monday - Friday. Please note that voicemails left after 4:00 p.m. may not be returned until the following business day.  We are closed weekends and major holidays. You have access to a nurse at all times for urgent questions. Please call the main number to the clinic Dept: [phone] and follow the prompts.   For any non-urgent questions, you may also contact your provider using MyChart. We now offer e-Visits for anyone 12 and older to request care online for non-urgent symptoms. For details visit [email or web address].   Also download the MyChart app! Go to the app store, search "MyChart", open the app, select [facility], and log in with your MyChart username and password.  Masks are optional in the cancer centers. If you would like for your care team to wear a mask while they are taking care of you, please let them know. For doctor visits, patients may have with them one support person who is at least 53 years old. At this time, visitors are not allowed in the infusion area.  Ramucirumab injection What is this medication? RAMUCIRUMAB (ra mue SIR ue mab) is a monoclonal antibody. It is used to treat stomach cancer, colorectal cancer, liver cancer, and lung cancer. This medicine may be used for other purposes; ask your health care provider or pharmacist if you have questions.  COMMON BRAND NAME(S): Cyramza What should I tell my care team before I take this medication? They need to know if you have any of these conditions: bleeding disorders blood clots heart disease, including heart failure, heart attack, or chest pain (angina) high blood pressure infection (especially a virus infection such as  chickenpox, cold sores, or herpes) protein in your urine recent or planning to have surgery stroke an unusual or allergic reaction to ramucirumab, other medicines, foods, dyes, or preservatives pregnant or trying to get pregnant breast-feeding How should I use this medication? This medicine is for infusion into a vein. It is given by a health care professional in a hospital or clinic setting. Talk to your pediatrician regarding the use of this medicine in children. Special care may be needed. Overdosage: If you think you have taken too much of this medicine contact a poison control center or emergency room at once. NOTE: This medicine is only for you. Do not share this medicine with others. What if I miss a dose? It is important not to miss your dose. Call your doctor or health care professional if you are unable to keep an appointment. What may interact with this medication? Interactions have not been studied. This list may not describe all possible interactions. Give your health care provider a list of all the medicines, herbs, non-prescription drugs, or dietary supplements you use. Also tell them if you smoke, drink alcohol, or use illegal drugs. Some items may interact with your medicine. What should I watch for while using this medication? Your condition will be monitored carefully while you are receiving this medicine. You will need to to check your blood pressure and have your blood and urine tested while you are taking this medicine. Your condition will be monitored carefully while you are receiving this medicine. This medicine may increase your risk to bruise or bleed. Call your doctor or health care professional if you notice any unusual bleeding. Before having surgery, talk to your health care provider to make sure it is ok. This drug can increase the risk of poor healing of your surgical site or wound. You will need to stop this drug for 28 days before surgery. After surgery, wait at  least 2 weeks before restarting this drug. Make sure the surgical site or wound is healed enough before restarting this drug. Talk to your health care provider if questions. Do not become pregnant while taking this medicine or for 3 months after stopping it. Women should inform their doctor if they wish to become pregnant or think they might be pregnant. There is a potential for serious side effects to an unborn child. Talk to your health care professional or pharmacist for more information. Do not breast-feed an infant while taking this medicine or for 2 months after stopping it. This medicine may interfere with the ability to have a child. Talk with your doctor or health care professional if you are concerned about your fertility. What side effects may I notice from receiving this medication? Side effects that you should report to your doctor or health care professional as soon as possible: allergic reactions like skin rash, itching or hives, breathing problems, swelling of the face, lips, or tongue signs of infection - fever or chills, cough, sore throat chest pain or chest tightness confusion dizziness feeling faint or lightheaded, falls severe abdominal pain severe nausea, vomiting signs and symptoms of bleeding such as bloody or black, tarry stools; red or dark-brown urine; spitting up blood or brown  material that looks like coffee grounds; red spots on the skin; unusual bruising or bleeding from the eye, gums, or nose signs and symptoms of a blood clot such as breathing problems; changes in vision; chest pain; severe, sudden headache; pain, swelling, warmth in the leg; trouble speaking; sudden numbness or weakness of the face, arm or leg symptoms of a stroke: change in mental awareness, inability to talk or move one side of the body trouble walking, dizziness, loss of balance or coordination Side effects that usually do not require medical attention (report to your doctor or health care  professional if they continue or are bothersome): cold, clammy skin constipation diarrhea headache nausea, vomiting stomach pain unusually slow heartbeat unusually weak or tired This list may not describe all possible side effects. Call your doctor for medical advice about side effects. You may report side effects to FDA at 1-800-FDA-1088. Where should I keep my medication? This drug is given in a hospital or clinic and will not be stored at home. NOTE: This sheet is a summary. It may not cover all possible information. If you have questions about this medicine, talk to your doctor, pharmacist, or health care provider.  2023 Elsevier/Gold Standard (2021-03-02 00:00:00) Docetaxel injection What is this medication? DOCETAXEL (doe se TAX el) is a chemotherapy drug. It targets fast dividing cells, like cancer cells, and causes these cells to die. This medicine is used to treat many types of cancers like breast cancer, certain stomach cancers, head and neck cancer, lung cancer, and prostate cancer. This medicine may be used for other purposes; ask your health care provider or pharmacist if you have questions. COMMON BRAND NAME(S): Docefrez, Taxotere What should I tell my care team before I take this medication? They need to know if you have any of these conditions: infection (especially a virus infection such as chickenpox, cold sores, or herpes) liver disease low blood counts, like low white cell, platelet, or red cell counts an unusual or allergic reaction to docetaxel, polysorbate 80, other chemotherapy agents, other medicines, foods, dyes, or preservatives pregnant or trying to get pregnant breast-feeding How should I use this medication? This drug is given as an infusion into a vein. It is administered in a hospital or clinic by a specially trained health care professional. Talk to your pediatrician regarding the use of this medicine in children. Special care may be needed. Overdosage:  If you think you have taken too much of this medicine contact a poison control center or emergency room at once. NOTE: This medicine is only for you. Do not share this medicine with others. What if I miss a dose? It is important not to miss your dose. Call your doctor or health care professional if you are unable to keep an appointment. What may interact with this medication? Do not take this medicine with any of the following medications: live virus vaccines This medicine may also interact with the following medications: aprepitant certain antibiotics like erythromycin or clarithromycin certain antivirals for HIV or hepatitis certain medicines for fungal infections like fluconazole, itraconazole, ketoconazole, posaconazole, or voriconazole cimetidine ciprofloxacin conivaptan cyclosporine dronedarone fluvoxamine grapefruit juice imatinib verapamil This list may not describe all possible interactions. Give your health care provider a list of all the medicines, herbs, non-prescription drugs, or dietary supplements you use. Also tell them if you smoke, drink alcohol, or use illegal drugs. Some items may interact with your medicine. What should I watch for while using this medication? Your condition will be monitored carefully  while you are receiving this medicine. You will need important blood work done while you are taking this medicine. Call your doctor or health care professional for advice if you get a fever, chills or sore throat, or other symptoms of a cold or flu. Do not treat yourself. This drug decreases your body's ability to fight infections. Try to avoid being around people who are sick. Some products may contain alcohol. Ask your health care professional if this medicine contains alcohol. Be sure to tell all health care professionals you are taking this medicine. Certain medicines, like metronidazole and disulfiram, can cause an unpleasant reaction when taken with alcohol. The  reaction includes flushing, headache, nausea, vomiting, sweating, and increased thirst. The reaction can last from 30 minutes to several hours. You may get drowsy or dizzy. Do not drive, use machinery, or do anything that needs mental alertness until you know how this medicine affects you. Do not stand or sit up quickly, especially if you are an older patient. This reduces the risk of dizzy or fainting spells. Alcohol may interfere with the effect of this medicine. Talk to your health care professional about your risk of cancer. You may be more at risk for certain types of cancer if you take this medicine. Do not become pregnant while taking this medicine or for 6 months after stopping it. Women should inform their doctor if they wish to become pregnant or think they might be pregnant. There is a potential for serious side effects to an unborn child. Talk to your health care professional or pharmacist for more information. Do not breast-feed an infant while taking this medicine or for 1 week after stopping it. Males who get this medicine must use a condom during sex with females who can get pregnant. If you get a woman pregnant, the baby could have birth defects. The baby could die before they are born. You will need to continue wearing a condom for 3 months after stopping the medicine. Tell your health care provider right away if your partner becomes pregnant while you are taking this medicine. This may interfere with the ability to father a child. You should talk to your doctor or health care professional if you are concerned about your fertility. What side effects may I notice from receiving this medication? Side effects that you should report to your doctor or health care professional as soon as possible: allergic reactions like skin rash, itching or hives, swelling of the face, lips, or tongue blurred vision breathing problems changes in vision low blood counts - This drug may decrease the number of  white blood cells, red blood cells and platelets. You may be at increased risk for infections and bleeding. nausea and vomiting pain, redness or irritation at site where injected pain, tingling, numbness in the hands or feet redness, blistering, peeling, or loosening of the skin, including inside the mouth signs of decreased platelets or bleeding - bruising, pinpoint red spots on the skin, black, tarry stools, nosebleeds signs of decreased red blood cells - unusually weak or tired, fainting spells, lightheadedness signs of infection - fever or chills, cough, sore throat, pain or difficulty passing urine swelling of the ankle, feet, hands Side effects that usually do not require medical attention (report to your doctor or health care professional if they continue or are bothersome): constipation diarrhea fingernail or toenail changes hair loss loss of appetite mouth sores muscle pain This list may not describe all possible side effects. Call your doctor for medical  advice about side effects. You may report side effects to FDA at 1-800-FDA-1088. Where should I keep my medication? This drug is given in a hospital or clinic and will not be stored at home. NOTE: This sheet is a summary. It may not cover all possible information. If you have questions about this medicine, talk to your doctor, pharmacist, or health care provider.  2023 Elsevier/Gold Standard (2021-03-02 00:00:00)

## 2021-10-24 NOTE — Progress Notes (Signed)
[place]  [phone] [phone]) [phone]   Name: [name] Potts Date: 10/24/2021 MRN: [id number]  DOB: [birth year]  Patient Care Team: [name], MD as PCP - General (Internal Medicine) Gala Romney, Cristopher Estimable, MD (Gastroenterology)    REASON FOR CONSULTATION: [name] Potts is a 51 y.o. male with medical history including non-small cell lung cancer s/p carbo/Alimta/Keytruda now on second line systemic therapy including docetaxel and Cyramza (first cycle on 7/7), chronic back pain, and ADHD.  Palliative ask to see for symptom management and goals of care.    SOCIAL HISTORY:     reports that he quit smoking about 7 months ago. His smoking use included cigarettes. He smoked an average of .5 packs per day. His smokeless tobacco use includes snuff. He reports that he does not currently use alcohol. He reports that he does not currently use drugs after having used the following drugs: Marijuana.  ADVANCE DIRECTIVES:    CODE STATUS:   PAST MEDICAL HISTORY: Past Medical History:  Diagnosis Date   ADHD (attention deficit hyperactivity disorder)    Chronic back pain    Complication of anesthesia    woke up during 1 of 3 colonoscopies   Constipation    Dyspnea    Pneumonia    Seasonal allergies     PAST SURGICAL HISTORY:  Past Surgical History:  Procedure Laterality Date   COLONOSCOPY  10/11/2011   Procedure: COLONOSCOPY;  Surgeon: Daneil Dolin, MD;  [facility];  Service: Endoscopy;  Laterality: N/A;  1:45 PM   COLONOSCOPY N/A 01/23/2018   Procedure: COLONOSCOPY;  Surgeon: Daneil Dolin, MD;  [facility];  Service: Endoscopy;  Laterality: N/A;  1:00   IR IMAGING GUIDED PORT INSERTION  07/27/2021   NECK SURGERY     Apr 05, 2021- per pt report cervical surgery at day surgery center   POLYPECTOMY  01/23/2018   Procedure: POLYPECTOMY;  Surgeon: Daneil Dolin, MD;  [facility];  Service:  Endoscopy;;   VIDEO ASSISTED THORACOSCOPY (VATS)/DECORTICATION Right 06/27/2021   Procedure: VIDEO ASSISTED THORACOSCOPY (VATS), Drainage of RIGHT pleural effusion/ DECORTICATION;  Surgeon: Melrose Nakayama, MD;  Location: [facility] OR;  Service: Thoracic;  Laterality: Right;    HEMATOLOGY/ONCOLOGY HISTORY:  Oncology History  Adenocarcinoma of right lung, stage 4 ([place])  07/23/2021 Initial Diagnosis   Adenocarcinoma of right lung, stage 4 ([place])   07/23/2021 Cancer Staging   Staging form: Lung, AJCC 8th Edition - Clinical: Stage IVA (cT1b, cN2, cM1a) - Signed by Curt Bears, MD on 07/23/2021   08/01/2021 - 09/12/2021 Chemotherapy   Patient is on Treatment Plan : LUNG Carboplatin (5) + Pemetrexed (500) + Pembrolizumab (200) D1 q21d Induction x 4 cycles / Maintenance Pemetrexed (500) + Pembrolizumab (200) D1 q21d     10/24/2021 -  Chemotherapy   Patient is on Treatment Plan : LUNG Docetaxel + Ramucirumab q21d        ALLERGIES:  has No Known Allergies.  MEDICATIONS:  Current Outpatient Medications  Medication Sig Dispense Refill   diazepam (VALIUM) 2 MG tablet Take 1 tablet (2 mg total) by mouth every 12 (twelve) hours as needed for anxiety. 60 tablet 0   apixaban (ELIQUIS) 5 MG TABS tablet Take 1 tablet (5 mg total) by mouth 2 (two) times daily. 60 tablet 1   atorvastatin (LIPITOR) 40 MG tablet Take 1 tablet (40 mg total) by mouth daily. 90 tablet 1   dexamethasone (  DECADRON) 4 MG tablet Please take two tablets twice a day the day before chemo, the day of chemotherapy, and the day after chemotherapy 40 tablet 3   dronabinol (MARINOL) 2.5 MG capsule Take 1 capsule (2.5 mg total) by mouth 2 (two) times daily before a meal. 60 capsule 0   FeFum-FePoly-FA-B Cmp-C-Biot (INTEGRA PLUS) CAPS Take 1 capsule by mouth daily. 30 capsule 3   folic acid (FOLVITE) 1 MG tablet Take 1 tablet (1 mg total) by mouth daily. 30 tablet 4   lidocaine-prilocaine (EMLA) cream Apply to the Port-A-Cath site 30  minutes before chemotherapy 30 g 0   mirtazapine (REMERON) 30 MG tablet TAKE 1 TABLET BY MOUTH AT BEDTIME 30 tablet 0   morphine (MS CONTIN) 15 MG 12 hr tablet 1 tablet p.o. in a.m. and 2 tablets p.o. p.m. 90 tablet 0   oxyCODONE (OXY IR/ROXICODONE) 5 MG immediate release tablet Take 1 tablet (5 mg total) by mouth every 6 (six) hours as needed for moderate pain or severe pain. 60 tablet 0   polyethylene glycol (MIRALAX / GLYCOLAX) 17 g packet Take 17 g by mouth daily as needed for mild constipation. 14 each 0   prochlorperazine (COMPAZINE) 10 MG tablet TAKE 1 TABLET BY MOUTH EVERY 6 HOURS AS NEEDED FOR NAUSEA FOR VOMITING 30 tablet 2   No current facility-administered medications for this visit.   Facility-Administered Medications Ordered in Other Visits  Medication Dose Route Frequency Provider Last Rate Last Admin   DOCEtaxel (TAXOTERE) 130 mg in sodium chloride 0.9 % 250 mL chemo infusion  65 mg/m2 (Treatment Plan Recorded) Intravenous Once Curt Bears, MD       heparin lock flush 100 unit/mL  500 Units Intracatheter Once PRN Curt Bears, MD       sodium chloride flush (NS) 0.9 % injection 10 mL  10 mL Intracatheter PRN Curt Bears, MD        VITAL SIGNS: BP (!) 138/94 (BP Location: Left Arm, Patient Position: Sitting) Comment: Nurse notified  Pulse (!) 102   Temp 97.9 F (36.6 C)   Resp 16   Wt 170 lb (77.1 kg)   SpO2 100%   BMI 23.06 kg/m  Filed Weights   10/24/21 1041  Weight: 170 lb (77.1 kg)    Estimated body mass index is 23.06 kg/m as calculated from the following:   Height as of 10/01/21: 6' (1.829 m).   Weight as of this encounter: 170 lb (77.1 kg).  LABS: CBC:    Component Value Date/Time   WBC 20.0 (H) 10/24/2021 1016   WBC 6.4 08/07/2021 0808   HGB 9.8 (L) 10/24/2021 1016   HGB 13.7 11/22/2020 1644   HCT 28.8 (L) 10/24/2021 1016   HCT 40.0 11/22/2020 1644   PLT 133 (L) 10/24/2021 1016   PLT 216 11/22/2020 1644   MCV 84.5 10/24/2021 1016    MCV 87 11/22/2020 1644   NEUTROABS 17.8 (H) 10/24/2021 1016   NEUTROABS 5.4 11/22/2020 1644   LYMPHSABS 0.9 10/24/2021 1016   LYMPHSABS 2.4 11/22/2020 1644   MONOABS 1.1 (H) 10/24/2021 1016   EOSABS 0.0 10/24/2021 1016   EOSABS 0.1 11/22/2020 1644   BASOSABS 0.0 10/24/2021 1016   BASOSABS 0.1 11/22/2020 1644   Comprehensive Metabolic Panel:    Component Value Date/Time   NA 136 10/24/2021 1016   NA 140 11/22/2020 1644   K 3.8 10/24/2021 1016   CL 102 10/24/2021 1016   CO2 25 10/24/2021 1016   BUN 29 (H)  10/24/2021 1016   BUN 13 11/22/2020 1644   CREATININE 1.26 (H) 10/24/2021 1016   GLUCOSE 151 (H) 10/24/2021 1016   CALCIUM 9.4 10/24/2021 1016   AST 32 10/24/2021 1016   ALT 25 10/24/2021 1016   ALKPHOS 133 (H) 10/24/2021 1016   BILITOT 0.5 10/24/2021 1016   PROT 7.3 10/24/2021 1016   PROT 6.6 11/22/2020 1644   ALBUMIN 3.7 10/24/2021 1016   ALBUMIN 4.3 11/22/2020 1644    RADIOGRAPHIC STUDIES: CT HEAD WO CONTRAST (5MM)  Result Date: 10/10/2021 CLINICAL DATA:  Adenocarcinoma of the right lung. Visual disturbance. Question metastatic disease. EXAM: CT HEAD WITHOUT CONTRAST TECHNIQUE: Contiguous axial images were obtained from the base of the skull through the vertex without intravenous contrast. RADIATION DOSE REDUCTION: This exam was performed according to the departmental dose-optimization program which includes automated exposure control, adjustment of the mA and/or kV according to patient size and/or use of iterative reconstruction technique. COMPARISON:  08/31/2021 FINDINGS: Brain: Normal appearance without contrast. No evidence of old or acute infarction, mass lesion, hemorrhage, hydrocephalus or extra-axial collection. Sensitivity to small metastatic lesions is decreased by the absence of contrast. Vascular: No abnormal vascular finding. Skull: Negative Sinuses/Orbits: Clear/normal Other: None IMPRESSION: Normal noncontrast head CT. No sign of metastatic disease. Sensitivity  to small lesions is reduced given the absence of contrast administration. Electronically Signed   By: Nelson Chimes M.D.   On: 10/10/2021 15:39   PERFORMANCE STATUS (ECOG) : 1 - Symptomatic but completely ambulatory  Review of Systems  Constitutional:  Negative for fatigue.  Musculoskeletal:  Positive for arthralgias and back pain.  Unless otherwise noted, a complete review of systems is negative.  Physical Exam General: NAD, well developed Cardiovascular: regular rate and rhythm Pulmonary: normal breathing pattern Extremities: no edema, no joint deformities Skin: no rashes Neurological: AAO x3, ambulatory   IMPRESSION: This is my initial visit with [name] Potts. His wife [name] Potts is with him today. No acute distress noted. Ambulatory without assistive devices.  Alert and oriented able to engage in discussions appropriately.  I introduced myself, [place] RN, and Palliative's role in collaboration with the oncology team. Concept of Palliative Care was introduced as specialized medical care for people and their families living with serious illness.  It focuses on providing relief from the symptoms and stress of a serious illness.  The goal is to improve quality of life for both the patient and the family. Values and goals of care important to patient and family were attempted to be elicited.   [name] Potts lives in the home with his wife of more than 28 years.  They have a daughter who is prepared to start her training at [employer] school of dentistry.  He is a retired Media planner for [employer].  Also has his Wellsite geologist.  Enjoys fishing and spending time with his wife.  He is able to perform all ADLs independently.  Requires some frequent breaks due to ongoing fatigue.  Appetite is improving.  Denies nausea, vomiting, constipation, and diarrhea.  Neoplasm related pain [name] Potts endorses ongoing pain.  He is appreciative to his response with current regimen.  Tolerating MS Contin and oxy IR  as needed for breakthrough pain.  Well-controlled on current regimen.  We discussed at length current regimen and frequency.  He is taking MS Contin 15 mg in the morning and 30 mg at bedtime.  Oxycodone IR as needed for breakthrough pain.  He is only requiring 2-3 times daily.  Rates his  pain currently at 4/10.  When pain is it is worse 6-7/10.  Pain decreases to 2/10 after taking medications.  We will continue to closely monitor and adjust as needed.  No changes made today.  Anxiety [name] Potts is currently taking Xanax twice daily as needed for anxiety.  Given his diagnosis and severity of his condition he is appropriately anxious given the unknown and significant health changes.  We discussed more long-term management for his anxiety.  We will transition him to Valium and discontinue Xanax.  Patient and wife verbalized understanding.  Constipation Occasional constipation however this has been controlled with daily MiraLAX.  Decreased appetite Appetite is improving. He is taking dexamethasone with his treatment cycles. He feels this helps his appetite more than anything. Over the past several weeks he has been able to eat more than he has in some time. Tolerating Marinol also.   We discussed ongoing use of appetite stimulant. His weight is up to 170lbs from 166lbs on 7/6.   Goals of care We discussed his current illness and what it means in the larger context of Her on-going co-morbidities. Natural disease trajectory and expectations were discussed.  [name] Potts and his wife are realistic in their understanding of his current illness.  They are remaining hopeful for continued stability/improvement.  Wishes to consider to treat the treatable aggressively allowing him every opportunity to continue to thrive for as long as he can.  I discussed the importance of continued conversation with his medical providers regarding overall plan of care and treatment options, ensuring decisions are within the  context of the patients values and GOCs.  PLAN: Establish therapeutic relationship.  Education provided on palliative's role in collaboration with his oncology/radiation team.   MS Contin 15 mg in the morning and MS Contin 30 mg at bedtime.  Tolerating well.  Pain is controlled.   Oxy IR 5-10 mg every 6 hours as needed for breakthrough pain.   Valium 2 mg twice daily as needed for anxiety.   MiraLAX daily for bowel regimen.   Continue with Marinol as prescribed for appetite.   I will plan to see patient back in 2-4 weeks in collaboration to other oncology appointments.    Patient expressed understanding and was in agreement with this plan. He also understands that He can call the clinic at any time with any questions, concerns, or complaints.   Thank you for your referral and allowing Palliative to assist in [name] Potts's care.   Number and complexity of problems addressed: 4 HIGH - 1 or more chronic illnesses with SEVERE exacerbation, progression, or side effects of treatment - advanced cancer, pain. Any controlled substances utilized were prescribed in the context of palliative care.  Time Total: 50 min   Visit consisted of counseling and education dealing with the complex and emotionally intense issues of symptom management and palliative care in the setting of serious and potentially life-threatening illness.Greater than 50%  of this time was spent counseling and coordinating care related to the above assessment and plan.  Signed by: Alda Lea, AGPCNP-BC Palliative Medicine Team/[place]

## 2021-10-25 ENCOUNTER — Telehealth: Payer: Self-pay | Admitting: Medical Oncology

## 2021-10-25 NOTE — Telephone Encounter (Signed)
Dental problem -Instructed pt to go see dentist and get issue evaluated.Wife was told that Ron can get his teeth cleaned and have dental X-rays.

## 2021-10-26 ENCOUNTER — Inpatient Hospital Stay: Payer: Commercial Managed Care - PPO

## 2021-10-26 ENCOUNTER — Other Ambulatory Visit: Payer: Self-pay

## 2021-10-26 ENCOUNTER — Telehealth: Payer: Self-pay

## 2021-10-26 VITALS — BP 110/68 | HR 79 | Temp 97.9°F | Resp 18

## 2021-10-26 DIAGNOSIS — C3431 Malignant neoplasm of lower lobe, right bronchus or lung: Secondary | ICD-10-CM | POA: Diagnosis not present

## 2021-10-26 DIAGNOSIS — C3491 Malignant neoplasm of unspecified part of right bronchus or lung: Secondary | ICD-10-CM

## 2021-10-26 MED ORDER — PEGFILGRASTIM-CBQV 6 MG/0.6ML ~~LOC~~ SOSY
6.0000 mg | PREFILLED_SYRINGE | Freq: Once | SUBCUTANEOUS | Status: AC
  Administered 2021-10-26: 6 mg via SUBCUTANEOUS
  Filled 2021-10-26: qty 0.6

## 2021-10-26 NOTE — Telephone Encounter (Signed)
Pt pharmacy called clarifying valium dose. Pt then called to confirm pick up and medication directions. Pt verbalized understanding to stop taking xannax, no further needs.

## 2021-10-26 NOTE — Patient Instructions (Signed)

## 2021-10-29 ENCOUNTER — Encounter (HOSPITAL_COMMUNITY): Payer: Self-pay

## 2021-10-29 ENCOUNTER — Emergency Department (HOSPITAL_COMMUNITY): Payer: Commercial Managed Care - PPO

## 2021-10-29 ENCOUNTER — Observation Stay (HOSPITAL_BASED_OUTPATIENT_CLINIC_OR_DEPARTMENT_OTHER)
Admission: EM | Admit: 2021-10-29 | Discharge: 2021-10-30 | Disposition: A | Discharge status: Home / Self Care | Payer: Commercial Managed Care - PPO | Source: Home / Self Care | Attending: Emergency Medicine | Admitting: Emergency Medicine

## 2021-10-29 ENCOUNTER — Other Ambulatory Visit: Payer: Self-pay

## 2021-10-29 ENCOUNTER — Telehealth: Payer: Self-pay | Admitting: Medical Oncology

## 2021-10-29 DIAGNOSIS — Z79899 Other long term (current) drug therapy: Secondary | ICD-10-CM | POA: Insufficient documentation

## 2021-10-29 DIAGNOSIS — R0902 Hypoxemia: Secondary | ICD-10-CM | POA: Diagnosis not present

## 2021-10-29 DIAGNOSIS — Z87891 Personal history of nicotine dependence: Secondary | ICD-10-CM | POA: Insufficient documentation

## 2021-10-29 DIAGNOSIS — Z86718 Personal history of other venous thrombosis and embolism: Secondary | ICD-10-CM | POA: Insufficient documentation

## 2021-10-29 DIAGNOSIS — J90 Pleural effusion, not elsewhere classified: Secondary | ICD-10-CM | POA: Insufficient documentation

## 2021-10-29 DIAGNOSIS — N179 Acute kidney failure, unspecified: Secondary | ICD-10-CM | POA: Insufficient documentation

## 2021-10-29 DIAGNOSIS — C3491 Malignant neoplasm of unspecified part of right bronchus or lung: Secondary | ICD-10-CM | POA: Insufficient documentation

## 2021-10-29 DIAGNOSIS — C786 Secondary malignant neoplasm of retroperitoneum and peritoneum: Secondary | ICD-10-CM | POA: Insufficient documentation

## 2021-10-29 DIAGNOSIS — D696 Thrombocytopenia, unspecified: Secondary | ICD-10-CM

## 2021-10-29 DIAGNOSIS — R180 Malignant ascites: Secondary | ICD-10-CM | POA: Insufficient documentation

## 2021-10-29 DIAGNOSIS — K625 Hemorrhage of anus and rectum: Secondary | ICD-10-CM | POA: Insufficient documentation

## 2021-10-29 DIAGNOSIS — J9601 Acute respiratory failure with hypoxia: Secondary | ICD-10-CM | POA: Diagnosis not present

## 2021-10-29 DIAGNOSIS — D61818 Other pancytopenia: Secondary | ICD-10-CM | POA: Insufficient documentation

## 2021-10-29 DIAGNOSIS — Z7901 Long term (current) use of anticoagulants: Secondary | ICD-10-CM | POA: Insufficient documentation

## 2021-10-29 LAB — CBC
HCT: 31.3 % — ABNORMAL LOW (ref 39.0–52.0)
Hemoglobin: 10.4 g/dL — ABNORMAL LOW (ref 13.0–17.0)
MCH: 28.8 pg (ref 26.0–34.0)
MCHC: 33.2 g/dL (ref 30.0–36.0)
MCV: 86.7 fL (ref 80.0–100.0)
Platelets: 33 10*3/uL — ABNORMAL LOW (ref 150–400)
RBC: 3.61 MIL/uL — ABNORMAL LOW (ref 4.22–5.81)
RDW: 24.9 % — ABNORMAL HIGH (ref 11.5–15.5)
WBC: 10.3 10*3/uL (ref 4.0–10.5)
nRBC: 0 % (ref 0.0–0.2)

## 2021-10-29 LAB — COMPREHENSIVE METABOLIC PANEL
ALT: 41 U/L (ref 0–44)
AST: 49 U/L — ABNORMAL HIGH (ref 15–41)
Albumin: 3.1 g/dL — ABNORMAL LOW (ref 3.5–5.0)
Alkaline Phosphatase: 132 U/L — ABNORMAL HIGH (ref 38–126)
Anion gap: 8 (ref 5–15)
BUN: 23 mg/dL — ABNORMAL HIGH (ref 6–20)
CO2: 25 mmol/L (ref 22–32)
Calcium: 9.4 mg/dL (ref 8.9–10.3)
Chloride: 99 mmol/L (ref 98–111)
Creatinine, Ser: 1.25 mg/dL — ABNORMAL HIGH (ref 0.61–1.24)
GFR, Estimated: >60 mL/min (ref >60)
Glucose, Bld: 94 mg/dL (ref 70–99)
Potassium: 3.8 mmol/L (ref 3.5–5.1)
Sodium: 132 mmol/L — ABNORMAL LOW (ref 135–145)
Total Bilirubin: 0.9 mg/dL (ref 0.3–1.2)
Total Protein: 6.6 g/dL (ref 6.5–8.1)

## 2021-10-29 LAB — TYPE AND SCREEN
ABO/RH(D): O POS
Antibody Screen: NEGATIVE

## 2021-10-29 LAB — POC OCCULT BLOOD, ED: Fecal Occult Bld: POSITIVE — AB

## 2021-10-29 LAB — LIPASE, BLOOD: Lipase: 22 U/L (ref 11–51)

## 2021-10-29 MED ORDER — MORPHINE SULFATE (PF) 2 MG/ML IV SOLN
INTRAVENOUS | Status: AC
  Filled 2021-10-29: qty 1

## 2021-10-29 MED ORDER — MIRTAZAPINE 15 MG PO TABS
30.0000 mg | ORAL_TABLET | Freq: Every day | ORAL | Status: DC
  Administered 2021-10-29: 30 mg via ORAL
  Filled 2021-10-29: qty 1

## 2021-10-29 MED ORDER — SODIUM CHLORIDE 0.9 % IV BOLUS
500.0000 mL | Freq: Once | INTRAVENOUS | Status: AC
  Administered 2021-10-29: 500 mL via INTRAVENOUS

## 2021-10-29 MED ORDER — OXYCODONE HCL 5 MG PO TABS
5.0000 mg | ORAL_TABLET | Freq: Four times a day (QID) | ORAL | Status: DC | PRN
  Administered 2021-10-29: 5 mg via ORAL
  Filled 2021-10-29: qty 1

## 2021-10-29 MED ORDER — MORPHINE SULFATE (PF) 2 MG/ML IV SOLN
1.0000 mg | Freq: Once | INTRAVENOUS | Status: AC
  Administered 2021-10-29: 1 mg via INTRAVENOUS

## 2021-10-29 MED ORDER — POLYETHYLENE GLYCOL 3350 17 G PO PACK: 17.0000 g | PACK | Freq: Every day | ORAL | Status: DC | PRN

## 2021-10-29 MED ORDER — FOLIC ACID 1 MG PO TABS
1.0000 mg | ORAL_TABLET | Freq: Every day | ORAL | Status: DC
  Administered 2021-10-29 – 2021-10-30 (×2): 1 mg via ORAL
  Filled 2021-10-29 (×2): qty 1

## 2021-10-29 MED ORDER — MORPHINE SULFATE ER 15 MG PO TBCR: 15.0000 mg | EXTENDED_RELEASE_TABLET | Freq: Two times a day (BID) | ORAL | Status: DC

## 2021-10-29 MED ORDER — LACTATED RINGERS IV BOLUS
500.0000 mL | Freq: Once | INTRAVENOUS | Status: AC
  Administered 2021-10-30: 500 mL via INTRAVENOUS

## 2021-10-29 MED ORDER — MORPHINE SULFATE ER 15 MG PO TBCR
15.0000 mg | EXTENDED_RELEASE_TABLET | Freq: Every day | ORAL | Status: DC
  Administered 2021-10-30: 15 mg via ORAL
  Filled 2021-10-29: qty 1

## 2021-10-29 MED ORDER — SODIUM CHLORIDE 0.9% IV SOLUTION: Freq: Once | INTRAVENOUS | Status: AC

## 2021-10-29 MED ORDER — IOHEXOL 300 MG/ML  SOLN
100.0000 mL | Freq: Once | INTRAMUSCULAR | Status: AC | PRN
  Administered 2021-10-29: 100 mL via INTRAVENOUS

## 2021-10-29 MED ORDER — ONDANSETRON HCL 4 MG/2ML IJ SOLN
4.0000 mg | Freq: Once | INTRAMUSCULAR | Status: AC
  Administered 2021-10-29: 4 mg via INTRAVENOUS
  Filled 2021-10-29: qty 2

## 2021-10-29 MED ORDER — DIAZEPAM 2 MG PO TABS
2.0000 mg | ORAL_TABLET | Freq: Two times a day (BID) | ORAL | Status: DC | PRN
Start: 2021-10-29 — End: 2021-10-30

## 2021-10-29 MED ORDER — DIAZEPAM 5 MG PO TABS: 5.0000 mg | ORAL_TABLET | Freq: Once | ORAL | Status: DC

## 2021-10-29 MED ORDER — DRONABINOL 2.5 MG PO CAPS
2.5000 mg | ORAL_CAPSULE | Freq: Two times a day (BID) | ORAL | Status: DC
  Administered 2021-10-30: 2.5 mg via ORAL
  Filled 2021-10-29: qty 1

## 2021-10-29 MED ORDER — MORPHINE SULFATE ER 30 MG PO TBCR
30.0000 mg | EXTENDED_RELEASE_TABLET | Freq: Every day | ORAL | Status: DC
  Administered 2021-10-29: 30 mg via ORAL
  Filled 2021-10-29: qty 2

## 2021-10-29 MED ORDER — LORAZEPAM 2 MG/ML IJ SOLN
1.0000 mg | Freq: Once | INTRAMUSCULAR | Status: AC
Start: 2021-10-29 — End: 2021-10-29
  Administered 2021-10-29: 1 mg via INTRAVENOUS
  Filled 2021-10-29: qty 1

## 2021-10-29 NOTE — Telephone Encounter (Signed)
Per Dr. Julien Nordmann pt needs to be seen in ED.

## 2021-10-29 NOTE — H&P (Signed)
History and Physical    Patient: [name] Potts ZLD:[id number] DOB: [birth year] DOA: 10/29/2021 DOS: the patient was seen and examined on 10/29/2021 PCP: [name], MD  Patient coming from: Home  Chief Complaint:  Chief Complaint  Patient presents with   Rectal Bleeding   Abdominal Pain   HPI: [name] Potts is a 51 y.o. 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   Pt was having a bowel movement earlier today and he noticed bright red blood each time that he wiped. Has dark stool but he attributes that to iron supplementation. No hemorroids. Has diffuse abdominal pain but thinks it is hunger pain due to his low appetite.  He has issues with chronic constipation but takes MiraLAX daily.  No hx of GI bleed. No use of NSAIDS. No abdominal surgery hx.  Last routine colonoscopy in 01/2018 in [place] with Dr.Rourk. Had two 5-7 mm polyps in the rectum and hepatic flexure. Not able to locate pathology of polyps in Epic.  In the ED, he was afebrile, tachycardic and tachypneic on room air. Sodium of 132, K of 3.8, creatinine of 1.25 which is elevated from baseline of around 1.1. No leukocytosis, hemoglobin of 10.4 with previous of around 9.8.  Platelet of 33. FOBT is positive.  CT abdomen pelvis showing large right hydropneumothorax, increased abdominal pelvic ascites, moderate volume.  Increased soft tissue infiltrate and nodularity of the mesentery and omentum consistent with progressive peritoneal metastatic disease. Multiple splenic infarcts that are chronic with a smaller inferior age-indeterminate infarct.  Review of Systems: All pertinent negative and positives as above Past Medical History:   Diagnosis Date   ADHD (attention deficit hyperactivity disorder)    Chronic back pain    Complication of anesthesia    woke up during 1 of 3 colonoscopies   Constipation    Dyspnea    Pneumonia    Seasonal allergies    Past Surgical History:  Procedure Laterality Date   COLONOSCOPY  10/11/2011   Procedure: COLONOSCOPY;  Surgeon: Daneil Dolin, MD;  [facility];  Service: Endoscopy;  Laterality: N/A;  1:45 PM   COLONOSCOPY N/A 01/23/2018   Procedure: COLONOSCOPY;  Surgeon: Daneil Dolin, MD;  [facility];  Service: Endoscopy;  Laterality: N/A;  1:00   IR IMAGING GUIDED PORT INSERTION  07/27/2021   NECK SURGERY     Apr 05, 2021- per pt report cervical surgery at day surgery center   POLYPECTOMY  01/23/2018   Procedure: POLYPECTOMY;  Surgeon: Daneil Dolin, MD;  [facility];  Service: Endoscopy;;   VIDEO ASSISTED THORACOSCOPY (VATS)/DECORTICATION Right 06/27/2021   Procedure: VIDEO ASSISTED THORACOSCOPY (VATS), Drainage of RIGHT pleural effusion/ DECORTICATION;  Surgeon: Melrose Nakayama, MD;  Location: [facility] OR;  Service: Thoracic;  Laterality: Right;   Social History:  reports that he quit smoking about 7 months ago. His smoking use included cigarettes. He smoked an average of .5 packs per day. His smokeless tobacco use includes snuff. He reports that he does not currently use alcohol. He reports that he does not currently use drugs after having used the following drugs: Marijuana.  No Known Allergies  Family History  Problem Relation Age of Onset   Colon cancer Mother 18  passed at age 33   Stroke Father     Prior to Admission medications   Medication Sig Start Date End Date Taking? Authorizing Provider  apixaban (ELIQUIS) 5 MG TABS tablet Take 1 tablet (5 mg total) by mouth 2 (two) times daily. 10/11/21  Yes Barrett, Erin R, PA-C  dexamethasone (DECADRON) 4 MG tablet Please take two tablets twice a day the day before chemo, the day  of chemotherapy, and the day after chemotherapy 10/09/21  Yes Heilingoetter, Cassandra L, PA-C  diazepam (VALIUM) 2 MG tablet Take 1 tablet (2 mg total) by mouth every 12 (twelve) hours as needed for anxiety. 10/24/21  Yes Pickenpack-Cousar, Carlena Sax, NP  dronabinol (MARINOL) 2.5 MG capsule Take 1 capsule (2.5 mg total) by mouth 2 (two) times daily before a meal. 10/18/21  Yes Curt Bears, MD  FeFum-FePoly-FA-B Cmp-C-Biot (INTEGRA PLUS) CAPS Take 1 capsule by mouth daily. 07/23/21  Yes Curt Bears, MD  folic acid (FOLVITE) 1 MG tablet Take 1 tablet (1 mg total) by mouth daily. 07/23/21  Yes Curt Bears, MD  lidocaine-prilocaine (EMLA) cream Apply to the Port-A-Cath site 30 minutes before chemotherapy 07/23/21  Yes Curt Bears, MD  mirtazapine (REMERON) 30 MG tablet TAKE 1 TABLET BY MOUTH AT BEDTIME 10/23/21  Yes Curt Bears, MD  morphine (MS CONTIN) 15 MG 12 hr tablet 1 tablet p.o. in a.m. and 2 tablets p.o. p.m. Patient taking differently: Take 15 mg by mouth every 12 (twelve) hours. 10/12/21  Yes Curt Bears, MD  oxyCODONE (OXY IR/ROXICODONE) 5 MG immediate release tablet Take 1 tablet (5 mg total) by mouth every 6 (six) hours as needed for moderate pain or severe pain. 10/24/21  Yes Pickenpack-Cousar, Carlena Sax, NP  polyethylene glycol (MIRALAX / GLYCOLAX) 17 g packet Take 17 g by mouth daily as needed for mild constipation. 08/07/21  Yes Lavina Hamman, MD  prochlorperazine (COMPAZINE) 10 MG tablet TAKE 1 TABLET BY MOUTH EVERY 6 HOURS AS NEEDED FOR NAUSEA FOR VOMITING 09/03/21  Yes Heilingoetter, Cassandra L, PA-C  atorvastatin (LIPITOR) 40 MG tablet Take 1 tablet (40 mg total) by mouth daily. Patient not taking: Reported on 10/29/2021 07/03/20   Noreene Larsson, NP    Physical Exam: Vitals:   10/29/21 1930 10/29/21 2000 10/29/21 2215 10/29/21 2226  BP: 129/74 110/76 110/67   Pulse: (!) 111  (!) 101   Resp: 19 20 19    Temp:    98 F (36.7 C)  TempSrc:    Oral  SpO2: 100%   94%    Constitutional: NAD, calm, comfortable, middle-age male walking around the room unassisted Eyes: lids and conjunctivae normal ENMT: Mucous membranes are moist.  Neck: normal, supple Respiratory: clear to auscultation bilaterally, no wheezing, no crackles. Normal respiratory effort.  Cardiovascular: Regular rate and rhythm, no murmurs / rubs / gallops. No extremity edema.   Abdomen: Soft, mild distention but no tenderness, no masses palpated.  Bowel sounds positive.  Musculoskeletal: no clubbing / cyanosis. No joint deformity upper and lower extremities. Good ROM, no contractures. Normal muscle tone.  Skin: no rashes, lesions, ulcers.  Neurologic: CN 2-12 grossly intact.  Slow delayed response which he reports is normal for him following chemotherapy.  Strength 5/5 in all 4.  Psychiatric: Normal judgment and insight. Alert and oriented x 3. Normal mood. Data Reviewed:  See HPI  Assessment and Plan: * Rectal bleed 1 episode of bright red blood per rectum during a bowel movement today. -Currently on Eliquis for recent right lower  extremity DVT in April -FOBT is positive.  No hemorrhoids on exam.Last routine colonoscopy in 01/2018 in [place] with Dr.Rourk. Had two 5-7 mm polyps in the rectum and hepatic flexure. Not able to locate pathology of polyps in Epic. -CT A/P without findings to suggest source. Hgb stable at 10.4 with recent around 9.8. -Clear liquid diet now and then n.p.o. past midnight.  Need GI consult in the morning -See discussion on Eliquis and DVT below  Acute kidney injury ([place]) Creatinine is elevated to 1.25 from his baseline of 1.1.  Give 500 cc LR bolus.  Need to be judicious with fluids since he has moderate volume abdominal pelvic ascites. -Follow with repeat creatinine in the morning, avoid nephrotoxic agent  Adenocarcinoma of right lung, stage 4 ([facility]) - On second line systemic chemotherapy -Follows with Dr. Earlie Server -Repeat CT abdomen/pelvis today shows  increase progressive peritoneal metastatic disease  Thrombocytopenia ([place]) Platelets have significantly decreased down to 33 from prior of 133 just 5 days ago. Given concern of it being <50,000 and potential active bleed, will transfuse 1u of platelet.  History of DVT (deep vein thrombosis) Hx of right LE DVT dx on 07/27/21 likely secondary to his malignancy -need to Eliquis due to concerns of GI bleed. Believes last dose was this morning -will check repeat right LE venous doppler ultrasound to assess if there is potential need for IVC filter if he is deemed not a good candidate for anticoagulation       Advance Care Planning:   Code Status: Full Code   Consults: needs GI consult in the morning  Family Communication: No family at bedside  Severity of Illness: The appropriate patient status for this patient is OBSERVATION. Observation status is judged to be reasonable and necessary in order to provide the required intensity of service to ensure the patient's safety. The patient's presenting symptoms, physical exam findings, and initial radiographic and laboratory data in the context of their medical condition is felt to place them at decreased risk for further clinical deterioration. Furthermore, it is anticipated that the patient will be medically stable for discharge from the hospital within 2 midnights of admission.   Author: Orene Desanctis, DO 10/29/2021 10:46 PM  For on call review [email or web address].

## 2021-10-29 NOTE — Assessment & Plan Note (Addendum)
1 episode of bright red blood per rectum during a bowel movement today. -Currently on Eliquis for recent right lower extremity DVT in April -FOBT is positive.  No hemorrhoids on exam.Last routine colonoscopy in 01/2018 in [place] with Dr.Rourk. Had two 5-7 mm polyps in the rectum and hepatic flexure. Not able to locate pathology of polyps in Epic. -CT A/P without findings to suggest source. Hgb stable at 10.4 with recent around 9.8. -Clear liquid diet now and then n.p.o. past midnight.  Need GI consult in the morning -See discussion on Eliquis and DVT below

## 2021-10-29 NOTE — Assessment & Plan Note (Signed)
Platelets have significantly decreased down to 33 from prior of 133 just 5 days ago. Given concern of it being <50,000 and potential active bleed, will transfuse 1u of platelet.

## 2021-10-29 NOTE — ED Triage Notes (Signed)
Pt woke up at 5am with bright red blood in his stool and after the 3rd time stool was black.   C/o right mid abdominal pain.  C/o nausea   Hx: lung cancer Last chemo last Wednesday    A/ox4 Ambulatory in triage

## 2021-10-29 NOTE — Assessment & Plan Note (Addendum)
-   On second line systemic chemotherapy -Follows with Dr. Earlie Server -Repeat CT abdomen/pelvis today shows increase progressive peritoneal metastatic disease

## 2021-10-29 NOTE — Assessment & Plan Note (Signed)
Creatinine is elevated to 1.25 from his baseline of 1.1.  Give 500 cc LR bolus.  Need to be judicious with fluids since he has moderate volume abdominal pelvic ascites. -Follow with repeat creatinine in the morning, avoid nephrotoxic agent

## 2021-10-29 NOTE — Telephone Encounter (Signed)
Today blood in stool -3 episodes loose stools and abdominal pain below umbilicus on Right side of stomach. 2 episodes had BRB streaks in stool and on  paper . The 3rd stool "was black". Pt has been on iron tablets since April . Today is first time he has had dark stool. Denies dizzy , light headed.

## 2021-10-29 NOTE — Assessment & Plan Note (Addendum)
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 Believes last dose was this morning -will check repeat right LE venous doppler ultrasound to assess if there is potential need for IVC filter if he is deemed not a good candidate for anticoagulation

## 2021-10-29 NOTE — ED Provider Triage Note (Signed)
Emergency Medicine Provider Triage Evaluation Note  [name] Potts , a 51 y.o. male  was evaluated in triage.  Pt complains of rectal bleeding and abdominal pain  Review of Systems  Positive: Blood in stool Negative:  fever  Physical Exam  BP 125/70 (BP Location: Left Arm)   Pulse (!) 120   Temp 98.7 F (37.1 C) (Oral)   Resp 18   SpO2 98%  Gen:   Awake, no distress   Resp:  Normal effort  MSK:   Moves extremities without difficulty  Other:   Abdomen tender  Medical Decision Making  Medically screening exam initiated at 2:52 PM.  Appropriate orders placed.  [name] Potts was informed that the remainder of the evaluation will be completed by another provider, this initial triage assessment does not replace that evaluation, and the importance of remaining in the ED until their evaluation is complete.     Fransico Meadow, [place] 10/29/21 1452

## 2021-10-29 NOTE — ED Provider Notes (Signed)
[facility] DEPT Provider Note   CSN: [id number] Arrival date & time: 10/29/21  1422     History  Chief Complaint  Patient presents with   Rectal Bleeding   Abdominal Pain    [name] Potts is a 51 y.o. male.  Patient is a 51 year old male who presents with rectal bleeding.  He has a history that significant for non-small cell carcinoma of the lung, currently undergoing chemotherapy, prior DVT on Eliquis, chronic back pain.  He states that this morning he noticed 2 episodes of bright red blood per rectum.  It was mostly when he wipes.  He also has some associated pain in his abdomen.  It was more on the right side now seems to be more epigastric.  He has had some nausea which is ongoing with his chemotherapy but no new nausea or vomiting.  He has a prior history of constipation but since he has been on a MiraLAX regimen, he is having fairly normal bowel movements.  He does have history of black bowel movements which is felt to be related to his iron supplements.  Today he has been feeling a bit lightheaded and more fatigued than normal.  No chest pain or shortness of breath.       Home Medications Prior to Admission medications   Medication Sig Start Date End Date Taking? Authorizing Provider  apixaban (ELIQUIS) 5 MG TABS tablet Take 1 tablet (5 mg total) by mouth 2 (two) times daily. 10/11/21  Yes Barrett, Erin R, PA-C  dexamethasone (DECADRON) 4 MG tablet Please take two tablets twice a day the day before chemo, the day of chemotherapy, and the day after chemotherapy 10/09/21  Yes Heilingoetter, Cassandra L, PA-C  diazepam (VALIUM) 2 MG tablet Take 1 tablet (2 mg total) by mouth every 12 (twelve) hours as needed for anxiety. 10/24/21  Yes Pickenpack-Cousar, Carlena Sax, NP  dronabinol (MARINOL) 2.5 MG capsule Take 1 capsule (2.5 mg total) by mouth 2 (two) times daily before a meal. 10/18/21  Yes Curt Bears, MD  FeFum-FePoly-FA-B Cmp-C-Biot (INTEGRA PLUS)  CAPS Take 1 capsule by mouth daily. 07/23/21  Yes Curt Bears, MD  folic acid (FOLVITE) 1 MG tablet Take 1 tablet (1 mg total) by mouth daily. 07/23/21  Yes Curt Bears, MD  lidocaine-prilocaine (EMLA) cream Apply to the Port-A-Cath site 30 minutes before chemotherapy 07/23/21  Yes Curt Bears, MD  mirtazapine (REMERON) 30 MG tablet TAKE 1 TABLET BY MOUTH AT BEDTIME 10/23/21  Yes Curt Bears, MD  morphine (MS CONTIN) 15 MG 12 hr tablet 1 tablet p.o. in a.m. and 2 tablets p.o. p.m. Patient taking differently: Take 15 mg by mouth every 12 (twelve) hours. 10/12/21  Yes Curt Bears, MD  oxyCODONE (OXY IR/ROXICODONE) 5 MG immediate release tablet Take 1 tablet (5 mg total) by mouth every 6 (six) hours as needed for moderate pain or severe pain. 10/24/21  Yes Pickenpack-Cousar, Carlena Sax, NP  polyethylene glycol (MIRALAX / GLYCOLAX) 17 g packet Take 17 g by mouth daily as needed for mild constipation. 08/07/21  Yes Lavina Hamman, MD  prochlorperazine (COMPAZINE) 10 MG tablet TAKE 1 TABLET BY MOUTH EVERY 6 HOURS AS NEEDED FOR NAUSEA FOR VOMITING 09/03/21  Yes Heilingoetter, Cassandra L, PA-C  atorvastatin (LIPITOR) 40 MG tablet Take 1 tablet (40 mg total) by mouth daily. Patient not taking: Reported on 10/29/2021 07/03/20   Noreene Larsson, NP      Allergies    Patient has no known allergies.  Review of Systems   Review of Systems  Constitutional:  Positive for fatigue. Negative for chills, diaphoresis and fever.  HENT:  Negative for congestion, rhinorrhea and sneezing.   Eyes: Negative.   Respiratory:  Negative for cough, chest tightness and shortness of breath.   Cardiovascular:  Negative for chest pain and leg swelling.  Gastrointestinal:  Positive for blood in stool and nausea. Negative for abdominal pain, diarrhea and vomiting.  Genitourinary:  Negative for difficulty urinating, flank pain, frequency and hematuria.  Musculoskeletal:  Negative for arthralgias and back pain.   Skin:  Negative for rash.  Neurological:  Positive for light-headedness. Negative for dizziness, speech difficulty, weakness, numbness and headaches.    Physical Exam Updated Vital Signs BP 127/89   Pulse (!) 110   Temp 98 F (36.7 C) (Oral)   Resp 18   SpO2 100%  Physical Exam Constitutional:      Appearance: He is well-developed.  HENT:     Head: Normocephalic and atraumatic.  Eyes:     Pupils: Pupils are equal, round, and reactive to light.  Cardiovascular:     Rate and Rhythm: Regular rhythm. Tachycardia present.     Heart sounds: Normal heart sounds.  Pulmonary:     Effort: Pulmonary effort is normal. No respiratory distress.     Breath sounds: Normal breath sounds. No wheezing or rales.  Chest:     Chest wall: No tenderness.  Abdominal:     General: Bowel sounds are normal.     Palpations: Abdomen is soft.     Tenderness: There is abdominal tenderness in the epigastric area and periumbilical area. There is no guarding or rebound.  Genitourinary:    Comments: Green/brown stool mixed with bright red blood.  No bleeding hemorrhoids or other visualized etiology for the bleeding noted Musculoskeletal:        General: Normal range of motion.     Cervical back: Normal range of motion and neck supple.  Lymphadenopathy:     Cervical: No cervical adenopathy.  Skin:    General: Skin is warm and dry.     Findings: No rash.  Neurological:     Mental Status: He is alert and oriented to person, place, and time.     ED Results / Procedures / Treatments   Labs (all labs ordered are listed, but only abnormal results are displayed) Labs Reviewed  COMPREHENSIVE METABOLIC PANEL - Abnormal; Notable for the following components:      Result Value   Sodium 132 (*)    BUN 23 (*)    Creatinine, Ser 1.25 (*)    Albumin 3.1 (*)    AST 49 (*)    Alkaline Phosphatase 132 (*)    All other components within normal limits  CBC - Abnormal; Notable for the following components:   RBC  3.61 (*)    Hemoglobin 10.4 (*)    HCT 31.3 (*)    RDW 24.9 (*)    Platelets 33 (*)    All other components within normal limits  POC OCCULT BLOOD, ED - Abnormal; Notable for the following components:   Fecal Occult Bld POSITIVE (*)    All other components within normal limits  LIPASE, BLOOD  TYPE AND SCREEN    EKG None  Radiology CT Abdomen Pelvis W Contrast  Result Date: 10/29/2021 CLINICAL DATA:  Epigastric pain with rectal bleeding EXAM: CT ABDOMEN AND PELVIS WITH CONTRAST TECHNIQUE: Multidetector CT imaging of the abdomen and pelvis was performed using the  standard protocol following bolus administration of intravenous contrast. RADIATION DOSE REDUCTION: This exam was performed according to the departmental dose-optimization program which includes automated exposure control, adjustment of the mA and/or kV according to patient size and/or use of iterative reconstruction technique. CONTRAST:  170mL OMNIPAQUE IOHEXOL 300 MG/ML  SOLN COMPARISON:  CT 09/27/2021, PET CT 07/20/2021 FINDINGS: Lower chest: Pleural thickening/disease on the right redemonstrated with incompletely visualized hydropneumothorax. Interval small amount of left posterior pleural thickening or fluid. Hepatobiliary: Subcentimeter hypodensities in the right hepatic lobe which are too small to further characterize. These are stable. No calcified gallstone. No biliary dilatation Pancreas: Unremarkable. No pancreatic ductal dilatation or surrounding inflammatory changes. Spleen: Several chronic splenic infarcts. Additional peripheral hypodensity lower pole of the spleen, coronal series 5, image 97, not clearly identified on the June exam and could represent additional small infarct. Adrenals/Urinary Tract: Adrenal glands are within normal limits. Kidneys show no hydronephrosis. The urinary bladder is unremarkable Stomach/Bowel: The stomach is nonenlarged. No dilated small bowel. No acute bowel wall thickening. Vascular/Lymphatic:  Nonaneurysmal aorta.  No enlarging lymph nodes Reproductive: Prostate is unremarkable. Other: No free air. Increased ascites, moderate volume. Increased infiltration and nodularity of the mesentery and omentum consistent with peritoneal metastatic disease. Musculoskeletal: No acute osseous abnormality IMPRESSION: 1. Incompletely visualized right hydropneumothorax with redemonstrated pleural disease. New small amount of left posterior pleural effusion or pleural disease. 2. Increased abdominopelvic ascites, now moderate volume. Increased soft tissue infiltration and nodularity of the mesentery and omentum consistent with progressive peritoneal metastatic disease. 3. Multiple splenic infarcts mostly chronic with age indeterminate small infarct more inferior Electronically Signed   By: Donavan Foil M.D.   On: 10/29/2021 17:31    Procedures Procedures    Medications Ordered in ED Medications  diazepam (VALIUM) tablet 5 mg (has no administration in time range)  sodium chloride 0.9 % bolus 500 mL (0 mLs Intravenous Stopped 10/29/21 1629)  iohexol (OMNIPAQUE) 300 MG/ML solution 100 mL (100 mLs Intravenous Contrast Given 10/29/21 1658)  ondansetron (ZOFRAN) injection 4 mg (4 mg Intravenous Given 10/29/21 1803)    ED Course/ Medical Decision Making/ A&P                           Medical Decision Making Amount and/or Complexity of Data Reviewed Labs: ordered. Radiology: ordered.  Risk Prescription drug management. Decision regarding hospitalization.   Patient is a 51 year old male who currently being treated for non-small cell carcinoma of the lung who presents with rectal bleeding.  He did have some gross blood on rectal exam but no active heavy bleeding.  His hemoglobin is actually a little bit better than his most recent values.  He does remain tachycardic.  He had a CT scan which showed some increased metastatic disease in his abdomen with associated increased ascites.  There is also right pleural  effusion.  He is not hypoxic.  He has a little bit of shortness of breath but no increased work of breathing.  Given that he is on anticoagulants with tachycardia and rectal bleeding, I recommended inpatient treatment.  I discussed the patient with Dr. Flossie Buffy who will admit the patient for further treatment.  Final Clinical Impression(s) / ED Diagnoses Final diagnoses:  Rectal bleeding  Malignant ascites  Pleural effusion on right    Rx / DC Orders ED Discharge Orders     None         Malvin Johns, MD 10/29/21 1930

## 2021-10-30 ENCOUNTER — Observation Stay (HOSPITAL_BASED_OUTPATIENT_CLINIC_OR_DEPARTMENT_OTHER): Payer: Commercial Managed Care - PPO

## 2021-10-30 ENCOUNTER — Other Ambulatory Visit: Payer: Self-pay

## 2021-10-30 ENCOUNTER — Encounter (HOSPITAL_COMMUNITY): Payer: Self-pay | Admitting: Family Medicine

## 2021-10-30 DIAGNOSIS — Z86718 Personal history of other venous thrombosis and embolism: Secondary | ICD-10-CM | POA: Diagnosis not present

## 2021-10-30 DIAGNOSIS — K625 Hemorrhage of anus and rectum: Secondary | ICD-10-CM | POA: Diagnosis not present

## 2021-10-30 DIAGNOSIS — R180 Malignant ascites: Secondary | ICD-10-CM

## 2021-10-30 DIAGNOSIS — C3491 Malignant neoplasm of unspecified part of right bronchus or lung: Secondary | ICD-10-CM | POA: Diagnosis not present

## 2021-10-30 DIAGNOSIS — J90 Pleural effusion, not elsewhere classified: Secondary | ICD-10-CM

## 2021-10-30 LAB — CBC
HCT: 26.4 % — ABNORMAL LOW (ref 39.0–52.0)
Hemoglobin: 8.7 g/dL — ABNORMAL LOW (ref 13.0–17.0)
MCH: 28.9 pg (ref 26.0–34.0)
MCHC: 33 g/dL (ref 30.0–36.0)
MCV: 87.7 fL (ref 80.0–100.0)
Platelets: 50 10*3/uL — ABNORMAL LOW (ref 150–400)
RBC: 3.01 MIL/uL — ABNORMAL LOW (ref 4.22–5.81)
RDW: 24.7 % — ABNORMAL HIGH (ref 11.5–15.5)
WBC: 4.9 10*3/uL (ref 4.0–10.5)
nRBC: 0 % (ref 0.0–0.2)

## 2021-10-30 LAB — COMPREHENSIVE METABOLIC PANEL
ALT: 30 U/L (ref 0–44)
AST: 35 U/L (ref 15–41)
Albumin: 2.7 g/dL — ABNORMAL LOW (ref 3.5–5.0)
Alkaline Phosphatase: 101 U/L (ref 38–126)
Anion gap: 8 (ref 5–15)
BUN: 20 mg/dL (ref 6–20)
CO2: 25 mmol/L (ref 22–32)
Calcium: 8.2 mg/dL — ABNORMAL LOW (ref 8.9–10.3)
Chloride: 101 mmol/L (ref 98–111)
Creatinine, Ser: 1.09 mg/dL (ref 0.61–1.24)
GFR, Estimated: >60 mL/min (ref >60)
Glucose, Bld: 77 mg/dL (ref 70–99)
Potassium: 4 mmol/L (ref 3.5–5.1)
Sodium: 134 mmol/L — ABNORMAL LOW (ref 135–145)
Total Bilirubin: 0.7 mg/dL (ref 0.3–1.2)
Total Protein: 5.5 g/dL — ABNORMAL LOW (ref 6.5–8.1)

## 2021-10-30 LAB — BPAM PLATELET PHERESIS
Blood Product Expiration Date: 202307182359
ISSUE DATE / TIME: 202307172250
Unit Type and Rh: 5100

## 2021-10-30 LAB — PREPARE PLATELET PHERESIS: Unit division: 0

## 2021-10-30 LAB — MAGNESIUM: Magnesium: 1.5 mg/dL — ABNORMAL LOW (ref 1.7–2.4)

## 2021-10-30 LAB — HEMOGLOBIN AND HEMATOCRIT, BLOOD
HCT: 28.5 % — ABNORMAL LOW (ref 39.0–52.0)
Hemoglobin: 9.3 g/dL — ABNORMAL LOW (ref 13.0–17.0)

## 2021-10-30 LAB — CK: Total CK: 29 U/L — ABNORMAL LOW (ref 49–397)

## 2021-10-30 MED ORDER — MAGNESIUM SULFATE 2 GM/50ML IV SOLN
2.0000 g | Freq: Once | INTRAVENOUS | Status: DC
  Administered 2021-10-30: 2 g via INTRAVENOUS
  Filled 2021-10-30: qty 50

## 2021-10-30 MED ORDER — SODIUM CHLORIDE 0.9% FLUSH
10.0000 mL | Freq: Two times a day (BID) | INTRAVENOUS | Status: DC
  Administered 2021-10-30: 10 mL

## 2021-10-30 MED ORDER — ORAL CARE MOUTH RINSE: 15.0000 mL | OROMUCOSAL | Status: DC | PRN

## 2021-10-30 MED ORDER — CHLORHEXIDINE GLUCONATE CLOTH 2 % EX PADS
6.0000 | MEDICATED_PAD | Freq: Every day | CUTANEOUS | Status: DC
  Administered 2021-10-30: 6 via TOPICAL

## 2021-10-30 MED ORDER — HEPARIN SOD (PORK) LOCK FLUSH 100 UNIT/ML IV SOLN
500.0000 [IU] | INTRAVENOUS | Status: AC | PRN
  Administered 2021-10-30: 500 [IU]
  Filled 2021-10-30: qty 5

## 2021-10-30 NOTE — Progress Notes (Signed)
RLE venous duplex has been completed.   Results can be found under chart review under CV PROC. 10/30/2021 9:39 AM Daquavion Catala RVT, RDMS

## 2021-10-30 NOTE — TOC Progression Note (Signed)
Transition of Care [facility]) - Progression Note    Patient Details  Name: [name] Potts MRN: [id number] Date of Birth: 1970-11-11  [facility] of [facility]) CM/SW Contact  Joaquin Courts, RN Phone Number: 10/30/2021, 10:00 AM  Clinical Narrative:          Transition of Care Department [facility]) has reviewed patient and no TOC needs have been identified at this time. We will continue to monitor patient advancement through interdisciplinary progression rounds. If new patient transition needs arise, please place a TOC consult.

## 2021-10-30 NOTE — Discharge Summary (Signed)
Physician Discharge Summary  [name] Potts:[id number] DOB: 1970-04-16 DOA: 10/29/2021  PCP: [name], MD  Admit date: 10/29/2021 Discharge date: 10/30/2021 Admitted From: Home Disposition: Home Recommendations for Outpatient Follow-up:  Follow up with PCP in 1 week Check CBC and CMP in 1 week Please follow up on the following pending results: None  Home Health: Not indicated Equipment/Devices: Not indicated  Discharge Condition: Stable CODE STATUS: Full code  Follow-up Information     [name], MD. Schedule an appointment as soon as possible for a visit in 1 week(s).   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  Reportedly, he noticed bright red blood each time he wiped.  He also had darker stool but attributes that to iron supplementation.  He has no known history of hemorrhoids.  Denies NSAID use.  Last routine colonoscopy in 01/2018 in [place] with Dr.Rourk. Had two 5-7 mm polyps in the rectum and hepatic flexure. Not able to locate pathology of polyps in Epic.  On presentation, Hgb 10.4 (previously 9.8).  Platelet 33.  Hemoccult positive.  Eliquis held.  Patient was given platelet transfusion.  LE venous Doppler ordered.  The next day, [facility] consulted and advanced diet to soft and recommended discharge if he tolerates soft diet.  GI also recommended holding Eliquis for 1 week if feasible.  Unfortunately, patient's venous Doppler showed distal DVT and RLE.  After risk-benefit discussion with patient and patient's wife at bedside, patient prefers to continue his Eliquis to avoid PE unless significant bleeding.  Patient and  family not interested in IVC filter.   See individual problem list below for more.   Problems addressed during this hospitalization Principal Problem:   Rectal bleed Active Problems:   Adenocarcinoma of right lung, stage 4 (HCC)   Pleural effusion on right   History of DVT (deep vein thrombosis)   Thrombocytopenia (HCC)   Malignant ascites   Hematochezia: Likely hemorrhoidal.  H&H stable.  Tolerated soft diet and cleared for discharge by GI.  GI recommended holding Eliquis for 1 week if feasible but prefers to continue to avoid PE unless significant bleed. -Repeat CBC in 1 week  Chronic right pleural effusion: Premalignant.  No respiratory distress. Abdominal pelvis ascites-likely malignant.  Patient is not symptomatic from this.  Stage IV lung cancer with metastasis -Outpatient follow-up  Acute on chronic thrombocytopenia: Improved after platelet transfusion -Recheck at follow-up  History of right LE DVT: Recent diagnosis on 07/27/2021.  LE venous Doppler with age-indeterminate RLE DVT. -See anticoagulation discussion above  Sinus tachycardia: Asymptomatic.  AKI ruled out.           Vital signs Vitals:   10/30/21 0517 10/30/21 0748 10/30/21 0749 10/30/21 1223  BP: 116/73 122/66 122/66 112/75  Pulse: (!) 110 (!) 112 (!) 112 (!) 114  Temp: 98.2 F (36.8 C) 98.8 F (37.1 C) 98.8 F (37.1 C) 99.5 F (37.5 C)  Resp: 18 18 17 20   Height:   6' (1.829 m)   Weight:   75.5 kg   SpO2: 93% 95%  94%  TempSrc: Oral Oral Oral Oral  BMI (Calculated):   22.57  Discharge exam  GENERAL: No apparent distress.  Nontoxic. HEENT: MMM.  Vision and hearing grossly intact.  NECK: Supple.  No apparent JVD.  RESP:  No IWOB.  Fair aeration bilaterally. CVS:  RRR. Heart sounds normal.  ABD/GI/GU: BS+. Abd full but soft.  NTND.  MSK/EXT:  Moves extremities. No apparent deformity. No edema.  SKIN: no apparent skin lesion or wound NEURO: Awake and alert. Oriented appropriately.   No apparent focal neuro deficit. PSYCH: Calm. Normal affect.   Discharge Instructions Discharge Instructions     Call MD for:  difficulty breathing, headache or visual disturbances   Complete by: As directed    Call MD for:  extreme fatigue   Complete by: As directed    Call MD for:  persistant dizziness or light-headedness   Complete by: As directed    Diet general   Complete by: As directed    Soft diet for the next 2 to 3 days.   Discharge instructions   Complete by: As directed    It has been a pleasure taking care of you!  You were hospitalized due to blood in stool.  Bleeding seems to have subsided.  Would recommend continuing soft diet for the next 3 to 4 days.  We also recommend using MiraLAX as needed to avoid constipation.  You may resume your Eliquis if no further bleeding or significant blood in stool.  Avoid any over-the-counter pain medication other than plain Tylenol.  Follow-up with your gastroenterologist and primary care doctor in 1 to 2 weeks or sooner if needed.   Take care,   Increase activity slowly   Complete by: As directed       Allergies as of 10/30/2021   No Known Allergies      Medication List     TAKE these medications    apixaban 5 MG Tabs tablet Commonly known as: Eliquis Take 1 tablet (5 mg total) by mouth 2 (two) times daily.   atorvastatin 40 MG tablet Commonly known as: LIPITOR Take 1 tablet (40 mg total) by mouth daily.   dexamethasone 4 MG tablet Commonly known as: DECADRON Please take two tablets twice a day the day before chemo, the day of chemotherapy, and the day after chemotherapy   diazepam 2 MG tablet Commonly known as: VALIUM Take 1 tablet (2 mg total) by mouth every 12 (twelve) hours as needed for anxiety.   dronabinol 2.5 MG capsule Commonly known as: MARINOL Take 1 capsule (2.5 mg total) by mouth 2 (two) times daily before a meal.   folic acid 1 MG tablet Commonly known as: FOLVITE Take 1 tablet (1 mg total) by  mouth daily.   Integra Plus Caps Take 1 capsule by mouth daily.   lidocaine-prilocaine cream Commonly known as: EMLA Apply to the Port-A-Cath site 30 minutes before chemotherapy   mirtazapine 30 MG tablet Commonly known as: REMERON TAKE 1 TABLET BY MOUTH AT BEDTIME   morphine 15 MG 12 hr tablet Commonly known as: MS CONTIN 1 tablet p.o. in a.m. and 2 tablets p.o. p.m. What changed:  how much to take how to take this when to take this additional instructions   oxyCODONE 5 MG immediate release tablet Commonly known as: Oxy IR/ROXICODONE Take 1 tablet (5 mg total) by mouth every 6 (six) hours as needed for moderate pain or severe pain.   polyethylene glycol 17 g packet Commonly known as: MIRALAX / GLYCOLAX Take 17 g by mouth daily as needed for mild constipation.  prochlorperazine 10 MG tablet Commonly known as: COMPAZINE TAKE 1 TABLET BY MOUTH EVERY 6 HOURS AS NEEDED FOR NAUSEA FOR VOMITING        Consultations: Gastroenterology  Procedures/Studies:   VAS [place] LOWER EXTREMITY VENOUS (DVT)  Result Date: 10/30/2021  Lower Venous DVT Study Patient Name:  LADONTE VERSTRAETE Doolan  Date of Exam:   10/30/2021 Medical Rec #: [id number]       Accession #:    [id number] Date of Birth: 02/04/1971        Patient Gender: M Patient Age:   4 years Exam Location:  [facility] Procedure:      VAS [place] LOWER EXTREMITY VENOUS (DVT) Referring Phys: CHING TU --------------------------------------------------------------------------------  Indications: Follow up exam. Other Indications: Patient with recent DVT admitted with rectal bleeding. Assess                    if there is a potential need for IVC filter if unable to                    continue anticoagulation. Risk Factors: Chemotherapy and tobacco abuse. Anticoagulation: Eliquis - prior to admission. Comparison Study: Previous exam on 07/17/21 was positive for DVT (Popliteal,                   Gastroc, PTV, and PeroV) Performing Technologist:  Jody Hill RVT, RDMS  Examination Guidelines: A complete evaluation includes B-mode imaging, spectral Doppler, color Doppler, and power Doppler as needed of all accessible portions of each vessel. Bilateral testing is considered an integral part of a complete examination. Limited examinations for reoccurring indications may be performed as noted. The reflux portion of the exam is performed with the patient in reverse Trendelenburg.  +---------+---------------+---------+-----------+----------+-----------------+ RIGHT    CompressibilityPhasicitySpontaneityPropertiesThrombus Aging    +---------+---------------+---------+-----------+----------+-----------------+ CFV      Full           Yes      Yes                                    +---------+---------------+---------+-----------+----------+-----------------+ SFJ      Full                                                           +---------+---------------+---------+-----------+----------+-----------------+ FV Prox  Full           Yes      Yes                                    +---------+---------------+---------+-----------+----------+-----------------+ FV Mid   Full           Yes      Yes                                    +---------+---------------+---------+-----------+----------+-----------------+ FV DistalFull           Yes      Yes                                    +---------+---------------+---------+-----------+----------+-----------------+  PFV      Full                                                           +---------+---------------+---------+-----------+----------+-----------------+ POP      Full           Yes      Yes                                    +---------+---------------+---------+-----------+----------+-----------------+ PTV      Full                                                           +---------+---------------+---------+-----------+----------+-----------------+ PERO      Full                                                           +---------+---------------+---------+-----------+----------+-----------------+ Gastroc  Partial        No       No                   Age Indeterminate +---------+---------------+---------+-----------+----------+-----------------+   +----+---------------+---------+-----------+----------+--------------+ LEFTCompressibilityPhasicitySpontaneityPropertiesThrombus Aging +----+---------------+---------+-----------+----------+--------------+ CFV Full           Yes      Yes                                 +----+---------------+---------+-----------+----------+--------------+     Summary: RIGHT: - Findings consistent with age indeterminate deep vein thrombosis involving the right gastrocnemius veins. - Findings appear improved from previous examination. - No cystic structure found in the popliteal fossa.  LEFT: - No evidence of common femoral vein obstruction.  *See table(s) above for measurements and observations. Electronically signed by Deitra Mayo MD on 10/30/2021 at 1:04:42 PM.    Final    CT Abdomen Pelvis W Contrast  Result Date: 10/29/2021 CLINICAL DATA:  Epigastric pain with rectal bleeding EXAM: CT ABDOMEN AND PELVIS WITH CONTRAST TECHNIQUE: Multidetector CT imaging of the abdomen and pelvis was performed using the standard protocol following bolus administration of intravenous contrast. RADIATION DOSE REDUCTION: This exam was performed according to the departmental dose-optimization program which includes automated exposure control, adjustment of the mA and/or kV according to patient size and/or use of iterative reconstruction technique. CONTRAST:  129mL OMNIPAQUE IOHEXOL 300 MG/ML  SOLN COMPARISON:  CT 09/27/2021, PET CT 07/20/2021 FINDINGS: Lower chest: Pleural thickening/disease on the right redemonstrated with incompletely visualized hydropneumothorax. Interval small amount of left posterior pleural thickening  or fluid. Hepatobiliary: Subcentimeter hypodensities in the right hepatic lobe which are too small to further characterize. These are stable. No calcified gallstone. No biliary dilatation Pancreas: Unremarkable. No pancreatic ductal dilatation or surrounding inflammatory changes. Spleen: Several chronic splenic infarcts. Additional peripheral hypodensity lower pole of the spleen, coronal series 5, image 97, not clearly  identified on the June exam and could represent additional small infarct. Adrenals/Urinary Tract: Adrenal glands are within normal limits. Kidneys show no hydronephrosis. The urinary bladder is unremarkable Stomach/Bowel: The stomach is nonenlarged. No dilated small bowel. No acute bowel wall thickening. Vascular/Lymphatic: Nonaneurysmal aorta.  No enlarging lymph nodes Reproductive: Prostate is unremarkable. Other: No free air. Increased ascites, moderate volume. Increased infiltration and nodularity of the mesentery and omentum consistent with peritoneal metastatic disease. Musculoskeletal: No acute osseous abnormality IMPRESSION: 1. Incompletely visualized right hydropneumothorax with redemonstrated pleural disease. New small amount of left posterior pleural effusion or pleural disease. 2. Increased abdominopelvic ascites, now moderate volume. Increased soft tissue infiltration and nodularity of the mesentery and omentum consistent with progressive peritoneal metastatic disease. 3. Multiple splenic infarcts mostly chronic with age indeterminate small infarct more inferior Electronically Signed   By: Donavan Foil M.D.   On: 10/29/2021 17:31   CT HEAD WO CONTRAST (5MM)  Result Date: 10/10/2021 CLINICAL DATA:  Adenocarcinoma of the right lung. Visual disturbance. Question metastatic disease. EXAM: CT HEAD WITHOUT CONTRAST TECHNIQUE: Contiguous axial images were obtained from the base of the skull through the vertex without intravenous contrast. RADIATION DOSE REDUCTION: This exam was performed  according to the departmental dose-optimization program which includes automated exposure control, adjustment of the mA and/or kV according to patient size and/or use of iterative reconstruction technique. COMPARISON:  08/31/2021 FINDINGS: Brain: Normal appearance without contrast. No evidence of old or acute infarction, mass lesion, hemorrhage, hydrocephalus or extra-axial collection. Sensitivity to small metastatic lesions is decreased by the absence of contrast. Vascular: No abnormal vascular finding. Skull: Negative Sinuses/Orbits: Clear/normal Other: None IMPRESSION: Normal noncontrast head CT. No sign of metastatic disease. Sensitivity to small lesions is reduced given the absence of contrast administration. Electronically Signed   By: Nelson Chimes M.D.   On: 10/10/2021 15:39       The results of significant diagnostics from this hospitalization (including imaging, microbiology, ancillary and laboratory) are listed below for reference.     Microbiology: No results found for this or any previous visit (from the past 240 hour(s)).   Labs:  CBC: Recent Labs  Lab 10/24/21 1016 10/29/21 1530 10/30/21 0350 10/30/21 0447  WBC 20.0* 10.3 4.9  --   NEUTROABS 17.8*  --   --   --   HGB 9.8* 10.4* 8.7* 9.3*  HCT 28.8* 31.3* 26.4* 28.5*  MCV 84.5 86.7 87.7  --   PLT 133* 33* 50*  --    BMP &GFR Recent Labs  Lab 10/24/21 1016 10/29/21 1530 10/30/21 0447  NA 136 132* 134*  K 3.8 3.8 4.0  CL 102 99 101  CO2 25 25 25   GLUCOSE 151* 94 77  BUN 29* 23* 20  CREATININE 1.26* 1.25* 1.09  CALCIUM 9.4 9.4 8.2*  MG  --   --  1.5*   Estimated Creatinine Clearance: 84.7 mL/min (by C-G formula based on SCr of 1.09 mg/dL). Liver & Pancreas: Recent Labs  Lab 10/24/21 1016 10/29/21 1530 10/30/21 0447  AST 32 49* 35  ALT 25 41 30  ALKPHOS 133* 132* 101  BILITOT 0.5 0.9 0.7  PROT 7.3 6.6 5.5*  ALBUMIN 3.7 3.1* 2.7*   Recent Labs  Lab 10/29/21 1530  LIPASE 22   No results for  input(s): "AMMONIA" in the last 168 hours. Diabetic: No results for input(s): "HGBA1C" in the last 72 hours. No results for input(s): "GLUCAP" in the last 168 hours. Cardiac Enzymes: Recent Labs  Lab 10/30/21  0447  CKTOTAL 29*   No results for input(s): "PROBNP" in the last 8760 hours. Coagulation Profile: No results for input(s): "INR", "PROTIME" in the last 168 hours. Thyroid Function Tests: No results for input(s): "TSH", "T4TOTAL", "FREET4", "T3FREE", "THYROIDAB" in the last 72 hours. Lipid Profile: No results for input(s): "CHOL", "HDL", "LDLCALC", "TRIG", "CHOLHDL", "LDLDIRECT" in the last 72 hours. Anemia Panel: No results for input(s): "VITAMINB12", "FOLATE", "FERRITIN", "TIBC", "IRON", "RETICCTPCT" in the last 72 hours. Urine analysis:    Component Value Date/Time   COLORURINE YELLOW 08/05/2021 1655   APPEARANCEUR CLOUDY (A) 08/05/2021 1655   LABSPEC 1.030 08/05/2021 1655   PHURINE 6.0 08/05/2021 1655   GLUCOSEU NEGATIVE 08/05/2021 1655   HGBUR SMALL (A) 08/05/2021 1655   BILIRUBINUR NEGATIVE 08/05/2021 [place] 08/05/2021 1655   PROTEINUR <30 (A) 10/24/2021 1029   NITRITE NEGATIVE 08/05/2021 1655   LEUKOCYTESUR NEGATIVE 08/05/2021 1655   Sepsis Labs: Invalid input(s): "PROCALCITONIN", "LACTICIDVEN"   SIGNED:  Mercy Riding, MD  [facility] 10/30/2021, 7:06 PM

## 2021-10-30 NOTE — Consult Note (Signed)
[name] Consultation Note  Referring Provider: [facility] Primary Care Physician:  [name], MD Primary Gastroenterologist:  Dr. Gala Romney  Reason for Consultation:  Hematochezia  HPI: [name] Potts is a 51 y.o. male whom we've been asked to see for hematochezia.  One isolated episode of hematochezia yesterday with several months of generalized abdominal pain.  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  He is on chemotherapy and has pancytopenia.  He is on Eliquis for DVT diagnoses in April.  No further hematochezia since admission.   Past Medical History:  Diagnosis Date   ADHD (attention deficit hyperactivity disorder)    Chronic back pain    Complication of anesthesia    woke up during 1 of 3 colonoscopies   Constipation    Dyspnea    Pneumonia    Seasonal allergies     Past Surgical History:  Procedure Laterality Date   COLONOSCOPY  10/11/2011   Procedure: COLONOSCOPY;  Surgeon: Daneil Dolin, MD;  Location: [facility];  Service: Endoscopy;  Laterality: N/A;  1:45 PM   COLONOSCOPY N/A 01/23/2018   Procedure: COLONOSCOPY;  Surgeon: Daneil Dolin, MD;  Location: [facility];  Service: Endoscopy;  Laterality: N/A;  1:00   IR IMAGING GUIDED PORT INSERTION  07/27/2021   NECK SURGERY     Apr 05, 2021- per pt report cervical surgery at day surgery center   POLYPECTOMY  01/23/2018   Procedure: POLYPECTOMY;  Surgeon: Daneil Dolin, MD;  Location: [facility];  Service: Endoscopy;;   VIDEO ASSISTED THORACOSCOPY (VATS)/DECORTICATION Right 06/27/2021   Procedure: VIDEO ASSISTED THORACOSCOPY (VATS), Drainage of RIGHT pleural effusion/ DECORTICATION;  Surgeon: Melrose Nakayama, MD;  Location: [facility];  Service: Thoracic;  Laterality: Right;    Prior to Admission medications   Medication Sig Start Date End Date Taking? Authorizing Provider  apixaban (ELIQUIS) 5 MG TABS tablet Take 1 tablet (5 mg total) by mouth 2 (two)  times daily. 10/11/21  Yes Barrett, Erin R, PA-C  dexamethasone (DECADRON) 4 MG tablet Please take two tablets twice a day the day before chemo, the day of chemotherapy, and the day after chemotherapy 10/09/21  Yes Heilingoetter, Cassandra L, PA-C  diazepam (VALIUM) 2 MG tablet Take 1 tablet (2 mg total) by mouth every 12 (twelve) hours as needed for anxiety. 10/24/21  Yes Pickenpack-Cousar, Carlena Sax, NP  dronabinol (MARINOL) 2.5 MG capsule Take 1 capsule (2.5 mg total) by mouth 2 (two) times daily before a meal. 10/18/21  Yes Curt Bears, MD  FeFum-FePoly-FA-B Cmp-C-Biot (INTEGRA PLUS) CAPS Take 1 capsule by mouth daily. 07/23/21  Yes Curt Bears, MD  folic acid (FOLVITE) 1 MG tablet Take 1 tablet (1 mg total) by mouth daily. 07/23/21  Yes Curt Bears, MD  lidocaine-prilocaine (EMLA) cream Apply to the Port-A-Cath site 30 minutes before chemotherapy 07/23/21  Yes Curt Bears, MD  mirtazapine (REMERON) 30 MG tablet TAKE 1 TABLET BY MOUTH AT BEDTIME 10/23/21  Yes Curt Bears, MD  morphine (MS CONTIN) 15 MG 12 hr tablet 1 tablet p.o. in a.m. and 2 tablets p.o. p.m. Patient taking differently: Take 15 mg by mouth every 12 (twelve) hours. 10/12/21  Yes Curt Bears, MD  oxyCODONE (OXY IR/ROXICODONE) 5 MG immediate release tablet Take 1 tablet (5 mg total) by mouth every 6 (six) hours as needed for moderate pain or severe pain. 10/24/21  Yes Pickenpack-Cousar, Carlena Sax, NP  polyethylene glycol (MIRALAX / GLYCOLAX) 17 g packet Take 17  g by mouth daily as needed for mild constipation. 08/07/21  Yes Lavina Hamman, MD  prochlorperazine (COMPAZINE) 10 MG tablet TAKE 1 TABLET BY MOUTH EVERY 6 HOURS AS NEEDED FOR NAUSEA FOR VOMITING 09/03/21  Yes Heilingoetter, Cassandra L, PA-C  atorvastatin (LIPITOR) 40 MG tablet Take 1 tablet (40 mg total) by mouth daily. Patient not taking: Reported on 10/29/2021 07/03/20   Noreene Larsson, NP    Current Facility-Administered Medications  Medication Dose  Route Frequency Provider Last Rate Last Admin   Chlorhexidine Gluconate Cloth 2 % PADS 6 each  6 each Topical Daily Ileene Musa T, DO   6 each at 10/30/21 4132   diazepam (VALIUM) tablet 2 mg  2 mg Oral Q12H PRN Tu, Ching T, DO       dronabinol (MARINOL) capsule 2.5 mg  2.5 mg Oral BID AC Tu, Ching T, DO   2.5 mg at 44/01/02 7253   folic acid (FOLVITE) tablet 1 mg  1 mg Oral Daily Tu, Ching T, DO   1 mg at 10/29/21 2224   mirtazapine (REMERON) tablet 30 mg  30 mg Oral QHS Tu, Ching T, DO   30 mg at 10/29/21 2224   morphine (MS CONTIN) 12 hr tablet 15 mg  15 mg Oral Daily Tu, Ching T, DO   15 mg at 10/30/21 6644   morphine (MS CONTIN) 12 hr tablet 30 mg  30 mg Oral QHS Tu, Ching T, DO   30 mg at 10/29/21 2223   Oral care mouth rinse  15 mL Mouth Rinse PRN Tu, Ching T, DO       oxyCODONE (Oxy IR/ROXICODONE) immediate release tablet 5 mg  5 mg Oral Q6H PRN Tu, Ching T, DO   5 mg at 10/29/21 2055   polyethylene glycol (MIRALAX / GLYCOLAX) packet 17 g  17 g Oral Daily PRN Tu, Ching T, DO       sodium chloride flush (NS) 0.9 % injection 10-40 mL  10-40 mL Intracatheter Q12H Tu, Ching T, DO   10 mL at 10/30/21 0940    Allergies as of 10/29/2021   (No Known Allergies)    Family History  Problem Relation Age of Onset   Colon cancer Mother 47       passed at age 53   Stroke Father     Social History   Socioeconomic History   Marital status: Married    Spouse name: Not on file   Number of children: 1   Years of education: Not on file   Highest education level: Not on file  Occupational History   Occupation: Librarian, academic  Tobacco Use   Smoking status: Former    Packs/day: 0.50    Types: Cigarettes    Quit date: 03/15/2021    Years since quitting: 0.6   Smokeless tobacco: Current    Types: Snuff   Tobacco comments:    Using nicotine pouches  Vaping Use   Vaping Use: Former  Substance and Sexual Activity   Alcohol use: Not Currently    Comment: has not used since 20s   Drug use: Not  Currently    Types: Marijuana    Comment: has not used since college   Sexual activity: Yes  Other Topics Concern   Not on file  Social History Narrative   Not on file   Social Determinants of Health   Financial Resource Strain: Not on file  Food Insecurity: Not on file  Transportation Needs: Not on  file  Physical Activity: Not on file  Stress: Not on file  Social Connections: Not on file  Intimate Partner Violence: Not on file    Review of Systems: As per HPI, all others negative  Physical Exam: Vital signs in last 24 hours: Temp:  [98 F (36.7 C)-98.8 F (37.1 C)] 98.8 F (37.1 C) (07/18 0749) Pulse Rate:  [99-120] 112 (07/18 0749) Resp:  [16-25] 17 (07/18 0749) BP: (93-142)/(65-89) 122/66 (07/18 0749) SpO2:  [93 %-100 %] 95 % (07/18 0748) Weight:  [75.5 kg] 75.5 kg (07/18 0749) Last BM Date : 10/29/21 General:   Alert,  deconditioned, older-appearing than stated age Head:  Normocephalic and atraumatic. Eyes:  Sclera clear, no icterus.   Conjunctiva pink. Ears:  Normal auditory acuity. Nose:  No deformity, discharge,  or lesions. Mouth:  No deformity or lesions.  Oropharynx pink & moist. Neck:  Supple; no masses or thyromegaly. Heart:  Regular rate and rhythm; no murmurs, clicks, rubs,  or gallops. Abdomen:  Soft, non tender, mild distention with dullness. No masses, hepatosplenomegaly or hernias noted. Normal bowel sounds, without guarding, and without rebound.     Msk:  Symmetrical without gross deformities. Normal posture. Pulses:  Normal pulses noted. Extremities:  Without clubbing or edema. Neurologic:  Alert and  oriented x4;  grossly normal neurologically. Skin:  Scattered telangiectasias and ecchymoses; Intact without significant lesions or rashes. Psych:  Alert and cooperative. Normal mood and affect.   Lab Results: Recent Labs    10/29/21 1530 10/30/21 0350 10/30/21 0447  WBC 10.3 4.9  --   HGB 10.4* 8.7* 9.3*  HCT 31.3* 26.4* 28.5*  PLT 33* 50*   --    BMET Recent Labs    10/29/21 1530 10/30/21 0447  NA 132* 134*  K 3.8 4.0  CL 99 101  CO2 25 25  GLUCOSE 94 77  BUN 23* 20  CREATININE 1.25* 1.09  CALCIUM 9.4 8.2*   LFT Recent Labs    10/30/21 0447  PROT 5.5*  ALBUMIN 2.7*  AST 35  ALT 30  ALKPHOS 101  BILITOT 0.7   PT/INR No results for input(s): "LABPROT", "INR" in the last 72 hours.  Studies/Results: VAS [place] LOWER EXTREMITY VENOUS (DVT)  Result Date: 10/30/2021  Lower Venous DVT Study Patient Name:  [name] Potts  Date of Exam:   10/30/2021 Medical Rec #: [id number]       Accession #:    [id number] Date of Birth: [birth year]        Patient Gender: M Patient Age:   1 years Exam Location:  [facility] Procedure:      VAS [place] LOWER EXTREMITY VENOUS (DVT) Referring Phys: CHING TU --------------------------------------------------------------------------------  Indications: Follow up exam. Other Indications: Patient with recent DVT admitted with rectal bleeding. Assess                    if there is a potential need for IVC filter if unable to                    continue anticoagulation. Risk Factors: Chemotherapy and tobacco abuse. Anticoagulation: Eliquis - prior to admission. Comparison Study: Previous exam on 07/17/21 was positive for DVT (Popliteal,                   Gastroc, PTV, and PeroV) Performing Technologist: Jody Hill RVT, RDMS  Examination Guidelines: A complete evaluation includes B-mode imaging, spectral Doppler, color Doppler, and power Doppler as needed  of all accessible portions of each vessel. Bilateral testing is considered an integral part of a complete examination. Limited examinations for reoccurring indications may be performed as noted. The reflux portion of the exam is performed with the patient in reverse Trendelenburg.  +---------+---------------+---------+-----------+----------+-----------------+ RIGHT    CompressibilityPhasicitySpontaneityPropertiesThrombus Aging     +---------+---------------+---------+-----------+----------+-----------------+ CFV      Full           Yes      Yes                                    +---------+---------------+---------+-----------+----------+-----------------+ SFJ      Full                                                           +---------+---------------+---------+-----------+----------+-----------------+ FV Prox  Full           Yes      Yes                                    +---------+---------------+---------+-----------+----------+-----------------+ FV Mid   Full           Yes      Yes                                    +---------+---------------+---------+-----------+----------+-----------------+ FV DistalFull           Yes      Yes                                    +---------+---------------+---------+-----------+----------+-----------------+ PFV      Full                                                           +---------+---------------+---------+-----------+----------+-----------------+ POP      Full           Yes      Yes                                    +---------+---------------+---------+-----------+----------+-----------------+ PTV      Full                                                           +---------+---------------+---------+-----------+----------+-----------------+ PERO     Full                                                           +---------+---------------+---------+-----------+----------+-----------------+  Gastroc  Partial        No       No                   Age Indeterminate +---------+---------------+---------+-----------+----------+-----------------+   +----+---------------+---------+-----------+----------+--------------+ LEFTCompressibilityPhasicitySpontaneityPropertiesThrombus Aging +----+---------------+---------+-----------+----------+--------------+ CFV Full           Yes      Yes                                  +----+---------------+---------+-----------+----------+--------------+    Summary: RIGHT: - Findings consistent with age indeterminate deep vein thrombosis involving the right gastrocnemius veins. - Findings appear improved from previous examination. - No cystic structure found in the popliteal fossa.  LEFT: - No evidence of common femoral vein obstruction.  *See table(s) above for measurements and observations.    Preliminary    CT Abdomen Pelvis W Contrast  Result Date: 10/29/2021 CLINICAL DATA:  Epigastric pain with rectal bleeding EXAM: CT ABDOMEN AND PELVIS WITH CONTRAST TECHNIQUE: Multidetector CT imaging of the abdomen and pelvis was performed using the standard protocol following bolus administration of intravenous contrast. RADIATION DOSE REDUCTION: This exam was performed according to the departmental dose-optimization program which includes automated exposure control, adjustment of the mA and/or kV according to patient size and/or use of iterative reconstruction technique. CONTRAST:  192mL OMNIPAQUE IOHEXOL 300 MG/ML  SOLN COMPARISON:  CT 09/27/2021, PET CT 07/20/2021 FINDINGS: Lower chest: Pleural thickening/disease on the right redemonstrated with incompletely visualized hydropneumothorax. Interval small amount of left posterior pleural thickening or fluid. Hepatobiliary: Subcentimeter hypodensities in the right hepatic lobe which are too small to further characterize. These are stable. No calcified gallstone. No biliary dilatation Pancreas: Unremarkable. No pancreatic ductal dilatation or surrounding inflammatory changes. Spleen: Several chronic splenic infarcts. Additional peripheral hypodensity lower pole of the spleen, coronal series 5, image 97, not clearly identified on the June exam and could represent additional small infarct. Adrenals/Urinary Tract: Adrenal glands are within normal limits. Kidneys show no hydronephrosis. The urinary bladder is unremarkable Stomach/Bowel: The stomach is  nonenlarged. No dilated small bowel. No acute bowel wall thickening. Vascular/Lymphatic: Nonaneurysmal aorta.  No enlarging lymph nodes Reproductive: Prostate is unremarkable. Other: No free air. Increased ascites, moderate volume. Increased infiltration and nodularity of the mesentery and omentum consistent with peritoneal metastatic disease. Musculoskeletal: No acute osseous abnormality IMPRESSION: 1. Incompletely visualized right hydropneumothorax with redemonstrated pleural disease. New small amount of left posterior pleural effusion or pleural disease. 2. Increased abdominopelvic ascites, now moderate volume. Increased soft tissue infiltration and nodularity of the mesentery and omentum consistent with progressive peritoneal metastatic disease. 3. Multiple splenic infarcts mostly chronic with age indeterminate small infarct more inferior Electronically Signed   By: Donavan Foil M.D.   On: 10/29/2021 17:31    Impression:   Hematochezia x 1.  Likely multifactorial, but chemotherapy-induced thrombocytopenia and anticoagulation likely contributing factors. Pancytopenia, likely chemotherapy-induced.  Has anemia which has been fairly stable over the past few months. Metastatic lung cancer with peritoneal carcinomatosis and ascites. Chronic anticoagulation in setting DVT April 2023.  Plan:   Hold Eliquis for the next 7 days, if clinically feasible. No plans for colonoscopy in absence of ongoing and destabilizing bleeding off anticoagulants with platelet count > 75 k. Advance diet as tolerated. [facility] will sign-off; he can follow-up outpatient GI with Dr. Gala Romney as needed; please call with questions; thank you for the consultation.   LOS: 0 days  Landry Dyke  10/30/2021, 11:12 AM  Cell [phone] If no answer or after 5 PM call [phone]

## 2021-10-31 ENCOUNTER — Inpatient Hospital Stay: Payer: Commercial Managed Care - PPO | Admitting: Internal Medicine

## 2021-10-31 ENCOUNTER — Inpatient Hospital Stay: Payer: Commercial Managed Care - PPO

## 2021-10-31 ENCOUNTER — Inpatient Hospital Stay (HOSPITAL_COMMUNITY)
Admission: EM | Admit: 2021-10-31 | Discharge: 2021-11-02 | DRG: 189 | Disposition: A | Discharge status: Home / Self Care | Payer: Commercial Managed Care - PPO | Attending: Internal Medicine | Admitting: Internal Medicine

## 2021-10-31 ENCOUNTER — Other Ambulatory Visit: Payer: Self-pay

## 2021-10-31 ENCOUNTER — Encounter (HOSPITAL_COMMUNITY): Payer: Self-pay | Admitting: Emergency Medicine

## 2021-10-31 ENCOUNTER — Emergency Department (HOSPITAL_COMMUNITY): Payer: Commercial Managed Care - PPO

## 2021-10-31 VITALS — BP 130/70 | HR 57 | Temp 98.3°F | Resp 16 | Wt 171.6 lb

## 2021-10-31 DIAGNOSIS — C3491 Malignant neoplasm of unspecified part of right bronchus or lung: Secondary | ICD-10-CM | POA: Diagnosis present

## 2021-10-31 DIAGNOSIS — Z902 Acquired absence of lung [part of]: Secondary | ICD-10-CM

## 2021-10-31 DIAGNOSIS — I82561 Chronic embolism and thrombosis of right calf muscular vein: Secondary | ICD-10-CM | POA: Diagnosis present

## 2021-10-31 DIAGNOSIS — G893 Neoplasm related pain (acute) (chronic): Secondary | ICD-10-CM | POA: Diagnosis present

## 2021-10-31 DIAGNOSIS — J9811 Atelectasis: Secondary | ICD-10-CM | POA: Diagnosis present

## 2021-10-31 DIAGNOSIS — D735 Infarction of spleen: Secondary | ICD-10-CM | POA: Diagnosis present

## 2021-10-31 DIAGNOSIS — J939 Pneumothorax, unspecified: Secondary | ICD-10-CM | POA: Diagnosis present

## 2021-10-31 DIAGNOSIS — J948 Other specified pleural conditions: Secondary | ICD-10-CM | POA: Diagnosis present

## 2021-10-31 DIAGNOSIS — Z95828 Presence of other vascular implants and grafts: Secondary | ICD-10-CM

## 2021-10-31 DIAGNOSIS — Z7952 Long term (current) use of systemic steroids: Secondary | ICD-10-CM

## 2021-10-31 DIAGNOSIS — I824Y1 Acute embolism and thrombosis of unspecified deep veins of right proximal lower extremity: Secondary | ICD-10-CM

## 2021-10-31 DIAGNOSIS — Z79899 Other long term (current) drug therapy: Secondary | ICD-10-CM

## 2021-10-31 DIAGNOSIS — J9601 Acute respiratory failure with hypoxia: Secondary | ICD-10-CM | POA: Diagnosis not present

## 2021-10-31 DIAGNOSIS — Z720 Tobacco use: Secondary | ICD-10-CM

## 2021-10-31 DIAGNOSIS — K921 Melena: Secondary | ICD-10-CM | POA: Diagnosis present

## 2021-10-31 DIAGNOSIS — Z5111 Encounter for antineoplastic chemotherapy: Secondary | ICD-10-CM

## 2021-10-31 DIAGNOSIS — J910 Malignant pleural effusion: Secondary | ICD-10-CM | POA: Diagnosis present

## 2021-10-31 DIAGNOSIS — D696 Thrombocytopenia, unspecified: Secondary | ICD-10-CM | POA: Diagnosis present

## 2021-10-31 DIAGNOSIS — Z20822 Contact with and (suspected) exposure to covid-19: Secondary | ICD-10-CM | POA: Diagnosis present

## 2021-10-31 DIAGNOSIS — Z79891 Long term (current) use of opiate analgesic: Secondary | ICD-10-CM

## 2021-10-31 DIAGNOSIS — R530 Neoplastic (malignant) related fatigue: Secondary | ICD-10-CM | POA: Diagnosis present

## 2021-10-31 DIAGNOSIS — R000 Tachycardia, unspecified: Secondary | ICD-10-CM | POA: Diagnosis present

## 2021-10-31 DIAGNOSIS — R0902 Hypoxemia: Secondary | ICD-10-CM | POA: Diagnosis not present

## 2021-10-31 DIAGNOSIS — Z7901 Long term (current) use of anticoagulants: Secondary | ICD-10-CM

## 2021-10-31 DIAGNOSIS — Z800 Family history of malignant neoplasm of digestive organs: Secondary | ICD-10-CM

## 2021-10-31 DIAGNOSIS — Z515 Encounter for palliative care: Secondary | ICD-10-CM

## 2021-10-31 DIAGNOSIS — Z66 Do not resuscitate: Secondary | ICD-10-CM | POA: Diagnosis present

## 2021-10-31 DIAGNOSIS — I82401 Acute embolism and thrombosis of unspecified deep veins of right lower extremity: Secondary | ICD-10-CM | POA: Diagnosis present

## 2021-10-31 DIAGNOSIS — F419 Anxiety disorder, unspecified: Secondary | ICD-10-CM | POA: Diagnosis present

## 2021-10-31 DIAGNOSIS — R180 Malignant ascites: Secondary | ICD-10-CM | POA: Diagnosis present

## 2021-10-31 DIAGNOSIS — K649 Unspecified hemorrhoids: Secondary | ICD-10-CM | POA: Diagnosis present

## 2021-10-31 DIAGNOSIS — C786 Secondary malignant neoplasm of retroperitoneum and peritoneum: Secondary | ICD-10-CM | POA: Diagnosis present

## 2021-10-31 DIAGNOSIS — T451X5A Adverse effect of antineoplastic and immunosuppressive drugs, initial encounter: Secondary | ICD-10-CM | POA: Diagnosis present

## 2021-10-31 DIAGNOSIS — D6959 Other secondary thrombocytopenia: Secondary | ICD-10-CM | POA: Diagnosis present

## 2021-10-31 DIAGNOSIS — D61818 Other pancytopenia: Secondary | ICD-10-CM | POA: Diagnosis present

## 2021-10-31 DIAGNOSIS — J302 Other seasonal allergic rhinitis: Secondary | ICD-10-CM | POA: Diagnosis present

## 2021-10-31 DIAGNOSIS — J432 Centrilobular emphysema: Secondary | ICD-10-CM | POA: Diagnosis present

## 2021-10-31 LAB — CMP (CANCER CENTER ONLY)
ALT: 35 U/L (ref 0–44)
AST: 55 U/L — ABNORMAL HIGH (ref 15–41)
Albumin: 2.8 g/dL — ABNORMAL LOW (ref 3.5–5.0)
Alkaline Phosphatase: 178 U/L — ABNORMAL HIGH (ref 38–126)
Anion gap: 12 (ref 5–15)
BUN: 19 mg/dL (ref 6–20)
CO2: 23 mmol/L (ref 22–32)
Calcium: 8.4 mg/dL — ABNORMAL LOW (ref 8.9–10.3)
Chloride: 94 mmol/L — ABNORMAL LOW (ref 98–111)
Creatinine: 1.43 mg/dL — ABNORMAL HIGH (ref 0.61–1.24)
GFR, Estimated: 59 mL/min — ABNORMAL LOW (ref >60)
Glucose, Bld: 151 mg/dL — ABNORMAL HIGH (ref 70–99)
Potassium: 3.7 mmol/L (ref 3.5–5.1)
Sodium: 129 mmol/L — ABNORMAL LOW (ref 135–145)
Total Bilirubin: 0.7 mg/dL (ref 0.3–1.2)
Total Protein: 6.2 g/dL — ABNORMAL LOW (ref 6.5–8.1)

## 2021-10-31 LAB — CBC WITH DIFFERENTIAL/PLATELET
Abs Immature Granulocytes: 1.27 10*3/uL — ABNORMAL HIGH (ref 0.00–0.07)
Band Neutrophils: 15 %
Basophils Absolute: 0 10*3/uL (ref 0.0–0.1)
Basophils Relative: 0 %
Blasts: 0 %
Eosinophils Absolute: 0 10*3/uL (ref 0.0–0.5)
Eosinophils Relative: 0 %
HCT: 34.4 % — ABNORMAL LOW (ref 39.0–52.0)
Hemoglobin: 11.3 g/dL — ABNORMAL LOW (ref 13.0–17.0)
Lymphocytes Relative: 11 %
Lymphs Abs: 2.3 10*3/uL (ref 0.7–4.0)
MCH: 28.4 pg (ref 26.0–34.0)
MCHC: 32.8 g/dL (ref 30.0–36.0)
MCV: 86.4 fL (ref 80.0–100.0)
Metamyelocytes Relative: 1 %
Monocytes Absolute: 0.6 10*3/uL (ref 0.1–1.0)
Monocytes Relative: 3 %
Myelocytes: 4 %
Neutro Abs: 17 10*3/uL — ABNORMAL HIGH (ref 1.7–7.7)
Neutrophils Relative %: 65 %
Other: 0 %
Platelets: 57 10*3/uL — ABNORMAL LOW (ref 150–400)
Promyelocytes Relative: 1 %
RBC: 3.98 MIL/uL — ABNORMAL LOW (ref 4.22–5.81)
RDW: 24.7 % — ABNORMAL HIGH (ref 11.5–15.5)
WBC: 21.2 10*3/uL — ABNORMAL HIGH (ref 4.0–10.5)
nRBC: 0 /100 WBC
nRBC: 1.5 % — ABNORMAL HIGH (ref 0.0–0.2)

## 2021-10-31 LAB — CBC WITH DIFFERENTIAL (CANCER CENTER ONLY)
Abs Immature Granulocytes: 3.01 10*3/uL — ABNORMAL HIGH (ref 0.00–0.07)
Basophils Absolute: 0.1 10*3/uL (ref 0.0–0.1)
Basophils Relative: 1 %
Eosinophils Absolute: 0.1 10*3/uL (ref 0.0–0.5)
Eosinophils Relative: 0 %
HCT: 29.3 % — ABNORMAL LOW (ref 39.0–52.0)
Hemoglobin: 9.8 g/dL — ABNORMAL LOW (ref 13.0–17.0)
Immature Granulocytes: 12 %
Lymphocytes Relative: 7 %
Lymphs Abs: 1.9 10*3/uL (ref 0.7–4.0)
MCH: 28.3 pg (ref 26.0–34.0)
MCHC: 33.4 g/dL (ref 30.0–36.0)
MCV: 84.7 fL (ref 80.0–100.0)
Monocytes Absolute: 3.2 10*3/uL — ABNORMAL HIGH (ref 0.1–1.0)
Monocytes Relative: 12 %
Neutro Abs: 18 10*3/uL — ABNORMAL HIGH (ref 1.7–7.7)
Neutrophils Relative %: 68 %
Platelet Count: 75 10*3/uL — ABNORMAL LOW (ref 150–400)
RBC: 3.46 MIL/uL — ABNORMAL LOW (ref 4.22–5.81)
RDW: 24.4 % — ABNORMAL HIGH (ref 11.5–15.5)
Smear Review: NORMAL
WBC Count: 26.3 10*3/uL — ABNORMAL HIGH (ref 4.0–10.5)
nRBC: 1.4 % — ABNORMAL HIGH (ref 0.0–0.2)

## 2021-10-31 LAB — TROPONIN I (HIGH SENSITIVITY)
Troponin I (High Sensitivity): 41 ng/L — ABNORMAL HIGH (ref <18)
Troponin I (High Sensitivity): 46 ng/L — ABNORMAL HIGH (ref <18)

## 2021-10-31 LAB — RESP PANEL BY RT-PCR (FLU A&B, COVID) ARPGX2
Influenza A by PCR: NEGATIVE
Influenza B by PCR: NEGATIVE
SARS Coronavirus 2 by RT PCR: NEGATIVE

## 2021-10-31 LAB — COMPREHENSIVE METABOLIC PANEL
ALT: 34 U/L (ref 0–44)
AST: 55 U/L — ABNORMAL HIGH (ref 15–41)
Albumin: 2.8 g/dL — ABNORMAL LOW (ref 3.5–5.0)
Alkaline Phosphatase: 175 U/L — ABNORMAL HIGH (ref 38–126)
Anion gap: 10 (ref 5–15)
BUN: 20 mg/dL (ref 6–20)
CO2: 24 mmol/L (ref 22–32)
Calcium: 8.6 mg/dL — ABNORMAL LOW (ref 8.9–10.3)
Chloride: 100 mmol/L (ref 98–111)
Creatinine, Ser: 1.31 mg/dL — ABNORMAL HIGH (ref 0.61–1.24)
GFR, Estimated: >60 mL/min (ref >60)
Glucose, Bld: 121 mg/dL — ABNORMAL HIGH (ref 70–99)
Potassium: 3.9 mmol/L (ref 3.5–5.1)
Sodium: 134 mmol/L — ABNORMAL LOW (ref 135–145)
Total Bilirubin: 0.8 mg/dL (ref 0.3–1.2)
Total Protein: 6.1 g/dL — ABNORMAL LOW (ref 6.5–8.1)

## 2021-10-31 LAB — LACTIC ACID, PLASMA: Lactic Acid, Venous: 1.9 mmol/L (ref 0.5–1.9)

## 2021-10-31 MED ORDER — POLYETHYLENE GLYCOL 3350 17 G PO PACK: 17.0000 g | PACK | Freq: Every day | ORAL | Status: DC | PRN

## 2021-10-31 MED ORDER — DIAZEPAM 2 MG PO TABS
2.0000 mg | ORAL_TABLET | Freq: Two times a day (BID) | ORAL | Status: DC | PRN
Start: 2021-10-31 — End: 2021-11-01

## 2021-10-31 MED ORDER — MORPHINE SULFATE ER 15 MG PO TBCR: 15.0000 mg | EXTENDED_RELEASE_TABLET | Freq: Two times a day (BID) | ORAL | Status: DC

## 2021-10-31 MED ORDER — SODIUM CHLORIDE 0.9 % IV BOLUS
500.0000 mL | Freq: Once | INTRAVENOUS | Status: AC
Start: 2021-10-31 — End: 2021-10-31
  Administered 2021-10-31: 500 mL via INTRAVENOUS

## 2021-10-31 MED ORDER — IOHEXOL 350 MG/ML SOLN
75.0000 mL | Freq: Once | INTRAVENOUS | Status: AC | PRN
  Administered 2021-10-31: 55 mL via INTRAVENOUS

## 2021-10-31 MED ORDER — AZITHROMYCIN 250 MG PO TABS
500.0000 mg | ORAL_TABLET | Freq: Every day | ORAL | Status: DC
  Administered 2021-10-31 – 2021-11-01 (×2): 500 mg via ORAL
  Filled 2021-10-31 (×2): qty 2

## 2021-10-31 MED ORDER — APIXABAN 5 MG PO TABS
5.0000 mg | ORAL_TABLET | Freq: Two times a day (BID) | ORAL | Status: DC
  Administered 2021-10-31 – 2021-11-01 (×2): 5 mg via ORAL
  Filled 2021-10-31 (×2): qty 1

## 2021-10-31 MED ORDER — HYDROMORPHONE HCL 1 MG/ML IJ SOLN
0.5000 mg | INTRAMUSCULAR | Status: DC | PRN
  Administered 2021-11-01 (×4): 1 mg via INTRAVENOUS
  Filled 2021-10-31 (×4): qty 1

## 2021-10-31 MED ORDER — HEPARIN SOD (PORK) LOCK FLUSH 100 UNIT/ML IV SOLN
500.0000 [IU] | Freq: Once | INTRAVENOUS | Status: AC
  Administered 2021-10-31: 500 [IU]

## 2021-10-31 MED ORDER — SODIUM CHLORIDE 0.9 % IV SOLN
2.0000 g | Freq: Every day | INTRAVENOUS | Status: DC
  Administered 2021-10-31 – 2021-11-01 (×2): 2 g via INTRAVENOUS
  Filled 2021-10-31 (×2): qty 20

## 2021-10-31 MED ORDER — MIRTAZAPINE 15 MG PO TABS
30.0000 mg | ORAL_TABLET | Freq: Every day | ORAL | Status: DC
  Administered 2021-10-31 – 2021-11-01 (×2): 30 mg via ORAL
  Filled 2021-10-31: qty 1
  Filled 2021-10-31: qty 2

## 2021-10-31 MED ORDER — MORPHINE SULFATE ER 15 MG PO TBCR
15.0000 mg | EXTENDED_RELEASE_TABLET | Freq: Every morning | ORAL | Status: DC
  Administered 2021-11-01 – 2021-11-02 (×2): 15 mg via ORAL
  Filled 2021-10-31 (×2): qty 1

## 2021-10-31 MED ORDER — LORAZEPAM 2 MG/ML IJ SOLN
1.0000 mg | Freq: Once | INTRAMUSCULAR | Status: AC
Start: 2021-10-31 — End: 2021-10-31
  Administered 2021-10-31: 1 mg via INTRAVENOUS
  Filled 2021-10-31: qty 1

## 2021-10-31 MED ORDER — HYDROMORPHONE HCL 1 MG/ML IJ SOLN
1.0000 mg | Freq: Once | INTRAMUSCULAR | Status: AC
  Administered 2021-10-31: 1 mg via INTRAVENOUS
  Filled 2021-10-31: qty 1

## 2021-10-31 MED ORDER — SODIUM CHLORIDE 0.9% FLUSH
10.0000 mL | Freq: Once | INTRAVENOUS | Status: AC
  Administered 2021-10-31: 10 mL

## 2021-10-31 MED ORDER — MORPHINE SULFATE ER 30 MG PO TBCR
30.0000 mg | EXTENDED_RELEASE_TABLET | Freq: Every day | ORAL | Status: DC
  Administered 2021-11-01: 30 mg via ORAL
  Filled 2021-10-31: qty 1

## 2021-10-31 NOTE — Assessment & Plan Note (Addendum)
Rapidly progressive recently. Onc recommending hospice in todays office note. Putting in for pal care consult. Most recent chemo on 7/12 followed by Neulasta on 7/14.

## 2021-10-31 NOTE — Assessment & Plan Note (Addendum)
Cont eliquis, watch for recurrence of GIB that he was just in hospital for and discharged yesterday.  Decision was made during that admit to resume eliquis despite risks.

## 2021-10-31 NOTE — Progress Notes (Signed)
[facility] [phone]   [phone]  OFFICE PROGRESS NOTE  [name], MD [place] [address]  DIAGNOSIS: Stage IV (T1b, N2, M1 a) non-small cell lung cancer, adenocarcinoma presented with right lower lobe lung nodule in addition to right paratracheal and subcarinal lymphadenopathy and malignant right pleural effusion as well as pleural metastatic disease diagnosed in March 2023.  His molecular studies showed no actionable mutation and PD-L1 expression was 1%. It showed KRAS G12D, Myc amplification, CCND3 amplification, TP53 R248L, VEGFA amplification but these are not actionable at this time.   PRIOR THERAPY: carboplatin for AUC of 5, Alimta 500 Mg/M2 and Keytruda 200 Mg IV every 3 weeks. First dose on 08/01/21. Status post 2 cycles. Alimta dose reduced to 400 mg/m2 starting from cycle #3.    CURRENT THERAPY: Second line systemic chemotherapy with docetaxel 65 Mg/M2 and Cyramza 10 Mg/KG every 3 weeks with Neulasta support.  First dose 10/19/2021.  Status post 1 cycle.  INTERVAL HISTORY: [name] Potts 51 y.o. male returns to the clinic today for follow-up visit accompanied by his wife.  The patient is not feeling well today with increasing fatigue and weakness.  He was seen at the emergency department 2 days ago and was admitted to the hospital with rectal bleeding.  He was seen by gastroenterology and felt that this could be from hemorrhoids or minor gastrointestinal erosion.  His bleeding stopped.  He is currently on Eliquis for history of deep venous thrombosis.  The patient also started treatment with docetaxel and Cyramza around 12 days ago.  He is complaining of increasing fatigue and weakness in his very pale today.  His oxygen saturation on arrival was 84% improved with oxygen nasal cannula but on walking around in the clinic dropped down to 50%.  He has no current nausea, vomiting but he has abdominal distention.  He denied having  any current fever or chills.  He was here today for evaluation and repeat blood work for close monitoring of his condition.  MEDICAL HISTORY: Past Medical History:  Diagnosis Date   ADHD (attention deficit hyperactivity disorder)    Chronic back pain    Complication of anesthesia    woke up during 1 of 3 colonoscopies   Constipation    Dyspnea    Pneumonia    Seasonal allergies     ALLERGIES:  has No Known Allergies.  MEDICATIONS:  Current Outpatient Medications  Medication Sig Dispense Refill   apixaban (ELIQUIS) 5 MG TABS tablet Take 1 tablet (5 mg total) by mouth 2 (two) times daily. 60 tablet 1   dexamethasone (DECADRON) 4 MG tablet Please take two tablets twice a day the day before chemo, the day of chemotherapy, and the day after chemotherapy 40 tablet 3   diazepam (VALIUM) 2 MG tablet Take 1 tablet (2 mg total) by mouth every 12 (twelve) hours as needed for anxiety. 60 tablet 0   dronabinol (MARINOL) 2.5 MG capsule Take 1 capsule (2.5 mg total) by mouth 2 (two) times daily before a meal. 60 capsule 0   FeFum-FePoly-FA-B Cmp-C-Biot (INTEGRA PLUS) CAPS Take 1 capsule by mouth daily. 30 capsule 3   folic acid (FOLVITE) 1 MG tablet Take 1 tablet (1 mg total) by mouth daily. 30 tablet 4   lidocaine-prilocaine (EMLA) cream Apply to the Port-A-Cath site 30 minutes before chemotherapy 30 g 0   mirtazapine (REMERON) 30 MG tablet TAKE 1 TABLET BY MOUTH AT BEDTIME 30  tablet 0   morphine (MS CONTIN) 15 MG 12 hr tablet 1 tablet p.o. in a.m. and 2 tablets p.o. p.m. (Patient taking differently: Take 15 mg by mouth every 12 (twelve) hours.) 90 tablet 0   oxyCODONE (OXY IR/ROXICODONE) 5 MG immediate release tablet Take 1 tablet (5 mg total) by mouth every 6 (six) hours as needed for moderate pain or severe pain. 60 tablet 0   polyethylene glycol (MIRALAX / GLYCOLAX) 17 g packet Take 17 g by mouth daily as needed for mild constipation. 14 each 0   prochlorperazine (COMPAZINE) 10 MG tablet TAKE 1  TABLET BY MOUTH EVERY 6 HOURS AS NEEDED FOR NAUSEA FOR VOMITING 30 tablet 2   No current facility-administered medications for this visit.    SURGICAL HISTORY:  Past Surgical History:  Procedure Laterality Date   COLONOSCOPY  10/11/2011   Procedure: COLONOSCOPY;  Surgeon: Daneil Dolin, MD;  [facility];  Service: Endoscopy;  Laterality: N/A;  1:45 PM   COLONOSCOPY N/A 01/23/2018   Procedure: COLONOSCOPY;  Surgeon: Daneil Dolin, MD;  [facility]: [facility];  Service: Endoscopy;  Laterality: N/A;  1:00   IR IMAGING GUIDED PORT INSERTION  07/27/2021   NECK SURGERY     Apr 05, 2021- per pt report cervical surgery at day surgery center   POLYPECTOMY  01/23/2018   Procedure: POLYPECTOMY;  Surgeon: Daneil Dolin, MD;  Location: [facility];  Service: Endoscopy;;   VIDEO ASSISTED THORACOSCOPY (VATS)/DECORTICATION Right 06/27/2021   Procedure: VIDEO ASSISTED THORACOSCOPY (VATS), Drainage of RIGHT pleural effusion/ DECORTICATION;  Surgeon: Melrose Nakayama, MD;  Location: [facility];  Service: Thoracic;  Laterality: Right;    REVIEW OF SYSTEMS:  Constitutional: positive for anorexia, fatigue, and weight loss Eyes: negative Ears, nose, mouth, throat, and face: negative Respiratory: positive for dyspnea on exertion Cardiovascular: positive for tachypnea Gastrointestinal: negative Genitourinary:negative Integument/breast: negative Hematologic/lymphatic: negative Musculoskeletal:positive for muscle weakness Neurological: negative Behavioral/Psych: negative Endocrine: negative Allergic/Immunologic: negative   PHYSICAL EXAMINATION: General appearance: alert, cooperative, fatigued, and no distress Head: Normocephalic, without obvious abnormality, atraumatic Neck: no adenopathy, no JVD, supple, symmetrical, trachea midline, and thyroid not enlarged, symmetric, no tenderness/mass/nodules Lymph nodes: Cervical, supraclavicular, and axillary nodes normal. Resp: clear to  auscultation bilaterally Back: symmetric, no curvature. ROM normal. No CVA tenderness. Cardio: Tachycardic GI: Distended with mild tenderness Extremities: extremities normal, atraumatic, no cyanosis or edema Neurologic: Alert and oriented X 3, normal strength and tone. Normal symmetric reflexes. Normal coordination and gait  ECOG PERFORMANCE STATUS: 1 - Symptomatic but completely ambulatory  Blood pressure 130/70, pulse (!) 57, temperature 98.3 F (36.8 C), temperature source Oral, resp. rate 16, weight 171 lb 9 oz (77.8 kg), SpO2 (!) 84 %.  LABORATORY DATA: Lab Results  Component Value Date   WBC 26.3 (H) 10/31/2021   HGB 9.8 (L) 10/31/2021   HCT 29.3 (L) 10/31/2021   MCV 84.7 10/31/2021   PLT 75 (L) 10/31/2021      Chemistry      Component Value Date/Time   NA 129 (L) 10/31/2021 1446   NA 140 11/22/2020 1644   K 3.7 10/31/2021 1446   CL 94 (L) 10/31/2021 1446   CO2 23 10/31/2021 1446   BUN 19 10/31/2021 1446   BUN 13 11/22/2020 1644   CREATININE 1.43 (H) 10/31/2021 1446      Component Value Date/Time   CALCIUM 8.4 (L) 10/31/2021 1446   ALKPHOS 178 (H) 10/31/2021 1446   AST 55 (H) 10/31/2021 1446  ALT 35 10/31/2021 1446   BILITOT 0.7 10/31/2021 1446       RADIOGRAPHIC STUDIES: VAS [place] LOWER EXTREMITY VENOUS (DVT)  Result Date: 10/30/2021  Lower Venous DVT Study Patient Name:  [name] Potts  Date of Exam:   10/30/2021 Medical Rec #: [id number]       Accession #:    [id number] Date of Birth: 10-30-1970        Patient Gender: M Patient Age:   51 years Exam Location:  [facility] Procedure:      VAS [place] LOWER EXTREMITY VENOUS (DVT) Referring Phys: CHING TU --------------------------------------------------------------------------------  Indications: Follow up exam. Other Indications: Patient with recent DVT admitted with rectal bleeding. Assess                    if there is a potential need for IVC filter if unable to                    continue anticoagulation. Risk  Factors: Chemotherapy and tobacco abuse. Anticoagulation: Eliquis - prior to admission. Comparison Study: Previous exam on 07/17/21 was positive for DVT (Popliteal,                   Gastroc, PTV, and PeroV) Performing Technologist: Jody Hill RVT, RDMS  Examination Guidelines: A complete evaluation includes B-mode imaging, spectral Doppler, color Doppler, and power Doppler as needed of all accessible portions of each vessel. Bilateral testing is considered an integral part of a complete examination. Limited examinations for reoccurring indications may be performed as noted. The reflux portion of the exam is performed with the patient in reverse Trendelenburg.  +---------+---------------+---------+-----------+----------+-----------------+ RIGHT    CompressibilityPhasicitySpontaneityPropertiesThrombus Aging    +---------+---------------+---------+-----------+----------+-----------------+ CFV      Full           Yes      Yes                                    +---------+---------------+---------+-----------+----------+-----------------+ SFJ      Full                                                           +---------+---------------+---------+-----------+----------+-----------------+ FV Prox  Full           Yes      Yes                                    +---------+---------------+---------+-----------+----------+-----------------+ FV Mid   Full           Yes      Yes                                    +---------+---------------+---------+-----------+----------+-----------------+ FV DistalFull           Yes      Yes                                    +---------+---------------+---------+-----------+----------+-----------------+ PFV      Full                                                           +---------+---------------+---------+-----------+----------+-----------------+  POP      Full           Yes      Yes                                     +---------+---------------+---------+-----------+----------+-----------------+ PTV      Full                                                           +---------+---------------+---------+-----------+----------+-----------------+ PERO     Full                                                           +---------+---------------+---------+-----------+----------+-----------------+ Gastroc  Partial        No       No                   Age Indeterminate +---------+---------------+---------+-----------+----------+-----------------+   +----+---------------+---------+-----------+----------+--------------+ LEFTCompressibilityPhasicitySpontaneityPropertiesThrombus Aging +----+---------------+---------+-----------+----------+--------------+ CFV Full           Yes      Yes                                 +----+---------------+---------+-----------+----------+--------------+     Summary: RIGHT: - Findings consistent with age indeterminate deep vein thrombosis involving the right gastrocnemius veins. - Findings appear improved from previous examination. - No cystic structure found in the popliteal fossa.  LEFT: - No evidence of common femoral vein obstruction.  *See table(s) above for measurements and observations. Electronically signed by Deitra Mayo MD on 10/30/2021 at 1:04:42 PM.    Final    CT Abdomen Pelvis W Contrast  Result Date: 10/29/2021 CLINICAL DATA:  Epigastric pain with rectal bleeding EXAM: CT ABDOMEN AND PELVIS WITH CONTRAST TECHNIQUE: Multidetector CT imaging of the abdomen and pelvis was performed using the standard protocol following bolus administration of intravenous contrast. RADIATION DOSE REDUCTION: This exam was performed according to the departmental dose-optimization program which includes automated exposure control, adjustment of the mA and/or kV according to patient size and/or use of iterative reconstruction technique. CONTRAST:  147mL OMNIPAQUE IOHEXOL  300 MG/ML  SOLN COMPARISON:  CT 09/27/2021, PET CT 07/20/2021 FINDINGS: Lower chest: Pleural thickening/disease on the right redemonstrated with incompletely visualized hydropneumothorax. Interval small amount of left posterior pleural thickening or fluid. Hepatobiliary: Subcentimeter hypodensities in the right hepatic lobe which are too small to further characterize. These are stable. No calcified gallstone. No biliary dilatation Pancreas: Unremarkable. No pancreatic ductal dilatation or surrounding inflammatory changes. Spleen: Several chronic splenic infarcts. Additional peripheral hypodensity lower pole of the spleen, coronal series 5, image 97, not clearly identified on the June exam and could represent additional small infarct. Adrenals/Urinary Tract: Adrenal glands are within normal limits. Kidneys show no hydronephrosis. The urinary bladder is unremarkable Stomach/Bowel: The stomach is nonenlarged. No dilated small bowel. No acute bowel wall thickening. Vascular/Lymphatic: Nonaneurysmal aorta.  No enlarging lymph nodes Reproductive: Prostate is unremarkable. Other: No free air. Increased ascites, moderate volume. Increased infiltration and nodularity  of the mesentery and omentum consistent with peritoneal metastatic disease. Musculoskeletal: No acute osseous abnormality IMPRESSION: 1. Incompletely visualized right hydropneumothorax with redemonstrated pleural disease. New small amount of left posterior pleural effusion or pleural disease. 2. Increased abdominopelvic ascites, now moderate volume. Increased soft tissue infiltration and nodularity of the mesentery and omentum consistent with progressive peritoneal metastatic disease. 3. Multiple splenic infarcts mostly chronic with age indeterminate small infarct more inferior Electronically Signed   By: Donavan Foil M.D.   On: 10/29/2021 17:31   CT HEAD WO CONTRAST (5MM)  Result Date: 10/10/2021 CLINICAL DATA:  Adenocarcinoma of the right lung. Visual  disturbance. Question metastatic disease. EXAM: CT HEAD WITHOUT CONTRAST TECHNIQUE: Contiguous axial images were obtained from the base of the skull through the vertex without intravenous contrast. RADIATION DOSE REDUCTION: This exam was performed according to the departmental dose-optimization program which includes automated exposure control, adjustment of the mA and/or kV according to patient size and/or use of iterative reconstruction technique. COMPARISON:  08/31/2021 FINDINGS: Brain: Normal appearance without contrast. No evidence of old or acute infarction, mass lesion, hemorrhage, hydrocephalus or extra-axial collection. Sensitivity to small metastatic lesions is decreased by the absence of contrast. Vascular: No abnormal vascular finding. Skull: Negative Sinuses/Orbits: Clear/normal Other: None IMPRESSION: Normal noncontrast head CT. No sign of metastatic disease. Sensitivity to small lesions is reduced given the absence of contrast administration. Electronically Signed   By: Nelson Chimes M.D.   On: 10/10/2021 15:39    ASSESSMENT AND PLAN: This is a very pleasant 51 years old white male with stage IV (T1b, N2, M1 a) non-small cell lung cancer, adenocarcinoma presented with right lower lobe lung nodule in addition to right paratracheal and subcarinal lymphadenopathy and malignant right pleural effusion as well as pleural metastatic disease diagnosed in March 2023. The patient has no actionable mutations and PD-L1 expression was 1%. He underwent systemic chemotherapy with carboplatin for AUC of 5, Alimta 400 Mg/M2 and Keytruda 200 Mg IV every 3 weeks.  Status post 3 cycles.  His dose of Alimta was reduced to 400 Mg/M2 starting from cycle #2 secondary to renal insufficiency and intolerance.  He has a rough time with this treatment with increasing fatigue and weakness as well as pancytopenia. The patient had evidence for disease progression and he started second line systemic chemotherapy with reduced dose  docetaxel 65 Mg/M2 and Cyramza 10 Mg/KG every 3 weeks with Neulasta support.  Status post 1 cycle.  He tolerated the treatment well with no concerning complaints but his disease is progressing at a very fast pace.  He had CT scan of the abdomen pelvis performed on 10/29/2021 that showed new small amount of left posterior pleural effusion or pleural disease in addition to increased abdominal pelvic ascites now moderate volume with increased soft tissue infiltration and nodularity of the mesentery and omentum consistent with progressive peritoneal metastatic disease.  He also has multiple splenic infarcts mostly chronic with age indeterminate. In the clinic his oxygen saturation dropped to 50% with ambulation. I had a lengthy discussion with the patient and his wife today about his condition.  Unfortunately his disease is progressing rapidly in a very short period.  I strongly recommend for the patient and his wife to consider palliative care and hospice at this point. Because of the respiratory distress and low oxygen saturation I recommended for the patient to go immediately to the emergency department for further evaluation and probably admission for management of his condition. For the pain management, he will  continue his current treatment with MS Contin and oxycodone. For the moderate ascites, we may consider The patient for ultrasound-guided paracentesis during his admission. For the weight loss and lack of appetite, he will continue his current treatment with Remeron and Marinol. I will arrange for the patient a follow-up appointment after discharge for reevaluation and more discussion of his treatment options if the decline palliative care and hospice.  Disclaimer: This note was dictated with voice recognition software. Similar sounding words can inadvertently be transcribed and may not be corrected upon review.

## 2021-10-31 NOTE — ED Notes (Signed)
Received report from previous RN. Pt received ativan prior to my arrival. Is resting comfortablly after returning from CT scan. Remains on 2 L of supplemental oxygen. VS WDL, except ST HR 110s.

## 2021-10-31 NOTE — ED Provider Notes (Signed)
Emergency Department Provider Note   I have reviewed the triage vital signs and the nursing notes.   HISTORY  Chief Complaint Hypoxia   HPI [name] Potts is a 51 y.o. male with past history of of non-small cell lung carcinoma, currently on chemotherapy, also on Eliquis presents to the emergency department for evaluation of hypoxemia noted at his oncology visit today.  Patient was discharged from the hospital yesterday after a brief admission for some GI bleeding.  Eliquis was continued at discharge and he has not had any additional bleeding episodes.  He went to see his oncologist today and upon check-in was found to have oxygen saturation of 84% on room air.  Patient states he was not particularly symptomatic at the time.  Upon ambulation, the patient had oxygen and saturation dropped to 50%.  He was started on supplemental O2 and and directed to the emergency department for further evaluation.  He denies any chest pain, pressure, tightness.  No fevers or chills.  No productive cough or hemoptysis.  He states his only area of discomfort is some distention in his abdomen.  He has no prior history of large-volume paracentesis.   Past Medical History:  Diagnosis Date   ADHD (attention deficit hyperactivity disorder)    Chronic back pain    Complication of anesthesia    woke up during 1 of 3 colonoscopies   Constipation    Dyspnea    Pneumonia    Seasonal allergies     Review of Systems  Constitutional: No fever/chills Eyes: No visual changes. ENT: No sore throat. Cardiovascular: Denies chest pain. Respiratory: Denies shortness of breath. Positive hypoxemia.  Gastrointestinal: No abdominal pain.  No nausea, no vomiting.  No diarrhea.  No constipation. Genitourinary: Negative for dysuria. Musculoskeletal: Negative for back pain. Skin: Negative for rash. Neurological: Negative for headaches, focal weakness or numbness. Positive "brain fog" symptoms.     ____________________________________________   PHYSICAL EXAM:  VITAL SIGNS: ED Triage Vitals [10/31/21 1620]  Enc Vitals Group     BP 129/77     Pulse Rate (!) 123     Resp 18     Temp 97.9 F (36.6 C)     Temp Source Oral     SpO2 98 %     Weight 171 lb 8.3 oz (77.8 kg)     Height 6' (1.829 m)   Constitutional: Alert and oriented. Well appearing and in no acute distress. Eyes: Conjunctivae are normal.  Head: Atraumatic. Nose: No congestion/rhinnorhea. 2L [place] in place. Patient appears comfortable.  Mouth/Throat: Mucous membranes are moist.   Neck: No stridor.  Cardiovascular: Normal rate, regular rhythm. Good peripheral circulation. Grossly normal heart sounds.   Respiratory: Normal respiratory effort.  No retractions. Lungs CTAB. Gastrointestinal: Soft and nontender. No distention.  Musculoskeletal: No lower extremity tenderness nor edema. No gross deformities of extremities. Neurologic:  Normal speech and language. No gross focal neurologic deficits are appreciated.  Skin:  Skin is warm, dry and intact. No rash noted.  ____________________________________________   LABS (all labs ordered are listed, but only abnormal results are displayed)  Labs Reviewed  COMPREHENSIVE METABOLIC PANEL - Abnormal; Notable for the following components:      Result Value   Sodium 134 (*)    Glucose, Bld 121 (*)    Creatinine, Ser 1.31 (*)    Calcium 8.6 (*)    Total Protein 6.1 (*)    Albumin 2.8 (*)    AST 55 (*)  Alkaline Phosphatase 175 (*)    All other components within normal limits  CBC WITH DIFFERENTIAL/PLATELET - Abnormal; Notable for the following components:   WBC 21.2 (*)    RBC 3.98 (*)    Hemoglobin 11.3 (*)    HCT 34.4 (*)    RDW 24.7 (*)    Platelets 57 (*)    nRBC 1.5 (*)    Neutro Abs 17.0 (*)    Abs Immature Granulocytes 1.27 (*)    All other components within normal limits  TROPONIN I (HIGH SENSITIVITY) - Abnormal; Notable for the following  components:   Troponin I (High Sensitivity) 41 (*)    All other components within normal limits  TROPONIN I (HIGH SENSITIVITY) - Abnormal; Notable for the following components:   Troponin I (High Sensitivity) 46 (*)    All other components within normal limits  RESP PANEL BY RT-PCR (FLU A&B, COVID) ARPGX2  CULTURE, BLOOD (ROUTINE X 2)  CULTURE, BLOOD (ROUTINE X 2)  LACTIC ACID, PLASMA  LACTIC ACID, PLASMA  HIV ANTIBODY (ROUTINE TESTING W REFLEX)  CBC  BASIC METABOLIC PANEL   ____________________________________________  EKG   EKG Interpretation  Date/Time:  Wednesday October 31 2021 16:55:11 EDT Ventricular Rate:  123 PR Interval:  144 QRS Duration: 87 QT Interval:  321 QTC Calculation: 460 R Axis:   102 Text Interpretation: Sinus tachycardia Low voltage, precordial leads Consider right ventricular hypertrophy Borderline T abnormalities, anterior leads Confirmed by Nanda Quinton [id number]) on 10/31/2021 4:58:40 PM        ____________________________________________  RADIOLOGY  CT Angio Chest PE W and/or Wo Contrast  Result Date: 10/31/2021 CLINICAL DATA:  Patient arriving from cancer center due to hypoxia. History of lung cancer. EXAM: CT ANGIOGRAPHY CHEST WITH CONTRAST TECHNIQUE: Multidetector CT imaging of the chest was performed using the standard protocol during bolus administration of intravenous contrast. Multiplanar CT image reconstructions and MIPs were obtained to evaluate the vascular anatomy. RADIATION DOSE REDUCTION: This exam was performed according to the departmental dose-optimization program which includes automated exposure control, adjustment of the mA and/or kV according to patient size and/or use of iterative reconstruction technique. CONTRAST:  37mL OMNIPAQUE IOHEXOL 350 MG/ML SOLN COMPARISON:  CT examination dated September 27, 2021. FINDINGS: Cardiovascular: Satisfactory opacification of the pulmonary arteries to the segmental level. No evidence of pulmonary  embolism. Normal heart size. No pericardial effusion. Mediastinum/Nodes: No enlarged mediastinal, hilar, or axillary lymph nodes. Thyroid gland, trachea, and esophagus demonstrate no significant findings. Lungs/Pleura: Large right lower lobe hydropneumothorax with pleural thickening is unchanged. Atelectasis and fibrotic changes of the right upper lobe, unchanged. Left lung is clear. No evidence of pneumonia or focal consolidation. Upper Abdomen: Moderate ascites in bilateral upper quadrants. Musculoskeletal: No chest wall abnormality. No acute or significant osseous findings. Review of the MIP images confirms the above findings. IMPRESSION: 1.  No evidence of pulmonary embolism. 2. Stable chronic findings of the right lower lobe volume loss, hydropneumothorax and prominent pleural thickening, unchanged. 3. Mild emphysematous changes. No acute pulmonary process in the residual right upper lobe as well as left lung. Aortic Atherosclerosis (ICD10-I70.0) and Emphysema (ICD10-J43.9). Electronically Signed   By: Keane Police D.O.   On: 10/31/2021 18:40   DG Chest 2 View  Result Date: 10/31/2021 CLINICAL DATA:  Short of breath, small cell lung cancer, malignant right pleural effusion EXAM: CHEST - 2 VIEW COMPARISON:  08/28/2021, 10/29/2021 FINDINGS: Frontal and lateral views of the chest demonstrates stable left chest wall port. Cardiac silhouette unchanged. Stable  right-sided hydropneumothorax with gas fluid level again seen at the right lung base. Chronic right perihilar consolidation greatest in the right lower lobe. Left chest is clear. No acute bony abnormality. IMPRESSION: 1. Stable right hydropneumothorax. 2. Chronic right perihilar consolidation, consistent with atelectasis and underlying malignancy. Electronically Signed   By: Randa Ngo M.D.   On: 10/31/2021 17:21    ____________________________________________   PROCEDURES  Procedure(s) performed:   .Critical Care  Performed by: Margette Fast, MD Authorized by: Margette Fast, MD   Critical care provider statement:    Critical care time (minutes):  35   Critical care was time spent personally by me on the following activities:  Development of treatment plan with patient or surrogate, discussions with consultants, evaluation of patient's response to treatment, examination of patient, ordering and review of laboratory studies, ordering and review of radiographic studies, ordering and performing treatments and interventions, pulse oximetry, re-evaluation of patient's condition, review of old charts and obtaining history from patient or surrogate   I assumed direction of critical care for this patient from another provider in my specialty: no     Care discussed with: admitting provider      ____________________________________________   INITIAL IMPRESSION / ASSESSMENT AND PLAN / ED COURSE  Pertinent labs & imaging results that were available during my care of the patient were reviewed by me and considered in my medical decision making (see chart for details).   This patient is Presenting for Evaluation of hypoxemia, which does require a range of treatment options, and is a complaint that involves a high risk of morbidity and mortality.  The Differential Diagnoses include PE, symptomatic pleural effusion, pneumothorax, pneumonia, COVID/flu, ACS, etc.  Critical Interventions-    Medications  HYDROmorphone (DILAUDID) injection 0.5-1 mg (has no administration in time range)  cefTRIAXone (ROCEPHIN) 2 g in sodium chloride 0.9 % 100 mL IVPB (0 g Intravenous Stopped 10/31/21 2143)  azithromycin (ZITHROMAX) tablet 500 mg (500 mg Oral Given 10/31/21 2114)  apixaban (ELIQUIS) tablet 5 mg (5 mg Oral Given 10/31/21 2142)  polyethylene glycol (MIRALAX / GLYCOLAX) packet 17 g (has no administration in time range)  mirtazapine (REMERON) tablet 30 mg (30 mg Oral Given 10/31/21 2142)  diazepam (VALIUM) tablet 2 mg (has no administration in time  range)  morphine (MS CONTIN) 12 hr tablet 30 mg (0 mg Oral Hold 10/31/21 2138)    And  morphine (MS CONTIN) 12 hr tablet 15 mg (has no administration in time range)  LORazepam (ATIVAN) injection 1 mg (1 mg Intravenous Given 10/31/21 1812)  iohexol (OMNIPAQUE) 350 MG/ML injection 75 mL (55 mLs Intravenous Contrast Given 10/31/21 1816)  sodium chloride 0.9 % bolus 500 mL (500 mLs Intravenous New Bag/Given 10/31/21 2039)  HYDROmorphone (DILAUDID) injection 1 mg (1 mg Intravenous Given 10/31/21 2040)    Reassessment after intervention:  O2 requirement remains.   I decided to review pertinent External Data, and in summary reviewed patient's discharge summary from yesterday.   Clinical Laboratory Tests Ordered, included CBC showing significant leukocytosis compared to yesterday's lab values.  No severe anemia.  Troponin is mildly elevated at 46, likely demand related.  Creatinine near baseline.  COVID and flu negative.  Radiologic Tests Ordered, included CXR and CTA. I independently interpreted the images and agree with radiology interpretation.   Cardiac Monitor Tracing which shows sinus tachycardia.   Social Determinants of Health Risk patient with a smoking history.   Consult complete with Hospitalist, Dr. Alcario Drought who will admit.  Spoke with Oncology, Dr. Lorenso Courier, who will have the consult team follow as an inpatient.   Medical Decision Making: Summary:  Patient presents to the emergency department for evaluation of hypoxemia.  He reports some mild brain fog type symptoms but no shortness of breath or chest pain.  There was no hypoxemia noted in his recent admission.  He does arrive with sinus tachycardia and is comfortable on 2 L.  He was only off Eliquis for 1 to 2 days max and has restarted since returning home.  PE is a consideration as above and plan to move for CT pending chest x-ray and initial blood work.   Reevaluation with update and discussion with patient.  Discussed plan for admit.  They are in agreement.   Disposition: admit  ____________________________________________  FINAL CLINICAL IMPRESSION(S) / ED DIAGNOSES  Final diagnoses:  Hypoxemia    Note:  This document was prepared using Dragon voice recognition software and may include unintentional dictation errors.  Nanda Quinton, MD, [facility] Emergency Medicine    Shateka Petrea, Wonda Olds, MD 10/31/21 2201

## 2021-10-31 NOTE — ED Notes (Signed)
Admitting provider at bedside.

## 2021-10-31 NOTE — Assessment & Plan Note (Signed)
Likely related to chemotherapy, history of same. Platelets of 57 today. Repeat CBC in AM.

## 2021-10-31 NOTE — ED Triage Notes (Signed)
Pt arriving from cancer center due to hypoxia. While ambulating, pts O2 dropped from 99% to 50%. Pt has hx of lung cancer. Pt A&O upon arrival. Currently on 2L O2 sats 98%.

## 2021-10-31 NOTE — H&P (Signed)
History and Physical    Patient: [name] Potts DOB: Oct 29, 1970 DOA: 10/31/2021 DOS: the patient was seen and examined on 10/31/2021 PCP: [name], MD  Patient coming from: Home  Chief Complaint:  Chief Complaint  Patient presents with   Hypoxia   HPI: [name] Potts is a 51 y.o. male with medical history significant of NSCLC, on chemo with most recent chemo 7/12 followed by neulasta 7/14.  Pt was just in hospital 7/17-7/18 with rectal bleeding.  Eliquis DCd for 2 days, bleeding stopped.  Pt found to have DVT in leg, Eliquis resumed on discharge.  Today at oncologists office pt found to have new onset hypoxia: 84% sat on RA at rest, O2 sat drops to 50% with ambulation.  Pt sent in to ED.  He denies any chest pain, pressure, tightness.  No fevers or chills.  No productive cough or hemoptysis.  He states his only area of discomfort is some distention in his abdomen.  He has no prior history of large-volume paracentesis.     Review of Systems: As mentioned in the history of present illness. All other systems reviewed and are negative. Past Medical History:  Diagnosis Date   ADHD (attention deficit hyperactivity disorder)    Chronic back pain    Complication of anesthesia    woke up during 1 of 3 colonoscopies   Constipation    Dyspnea    Pneumonia    Seasonal allergies    Past Surgical History:  Procedure Laterality Date   COLONOSCOPY  10/11/2011   Procedure: COLONOSCOPY;  Surgeon: Daneil Dolin, MD;  Location: [facility];  Service: Endoscopy;  Laterality: N/A;  1:45 PM   COLONOSCOPY N/A 01/23/2018   Procedure: COLONOSCOPY;  Surgeon: Daneil Dolin, MD;  Location: [facility];  Service: Endoscopy;  Laterality: N/A;  1:00   IR IMAGING GUIDED PORT INSERTION  07/27/2021   NECK SURGERY     Apr 05, 2021- per pt report cervical surgery at day surgery center   POLYPECTOMY  01/23/2018   Procedure: POLYPECTOMY;  Surgeon: Daneil Dolin, MD;  Location: [facility];  Service: Endoscopy;;   VIDEO ASSISTED THORACOSCOPY (VATS)/DECORTICATION Right 06/27/2021   Procedure: VIDEO ASSISTED THORACOSCOPY (VATS), Drainage of RIGHT pleural effusion/ DECORTICATION;  Surgeon: Melrose Nakayama, MD;  Location[facility] OR;  Service: Thoracic;  Laterality: Right;   Social History:  reports that he quit smoking about 7 months ago. His smoking use included cigarettes. He smoked an average of .5 packs per day. His smokeless tobacco use includes snuff. He reports that he does not currently use alcohol. He reports that he does not currently use drugs after having used the following drugs: Marijuana.  No Known Allergies  Family History  Problem Relation Age of Onset   Colon cancer Mother 9       passed at age 36   Stroke Father     Prior to Admission medications   Medication Sig Start Date End Date Taking? Authorizing Provider  apixaban (ELIQUIS) 5 MG TABS tablet Take 1 tablet (5 mg total) by mouth 2 (two) times daily. 10/11/21   Barrett, Erin R, PA-C  dexamethasone (DECADRON) 4 MG tablet Please take two tablets twice a day the day before chemo, the day of chemotherapy, and the day after chemotherapy 10/09/21   Heilingoetter, Cassandra L, PA-C  diazepam (VALIUM) 2 MG tablet Take 1 tablet (2 mg total) by mouth every 12 (twelve) hours as needed for anxiety. 10/24/21  Pickenpack-Cousar, Carlena Sax, NP  dronabinol (MARINOL) 2.5 MG capsule Take 1 capsule (2.5 mg total) by mouth 2 (two) times daily before a meal. 10/18/21   Curt Bears, MD  FeFum-FePoly-FA-B Cmp-C-Biot (INTEGRA PLUS) CAPS Take 1 capsule by mouth daily. 07/23/21   Curt Bears, MD  folic acid (FOLVITE) 1 MG tablet Take 1 tablet (1 mg total) by mouth daily. 07/23/21   Curt Bears, MD  lidocaine-prilocaine (EMLA) cream Apply to the Port-A-Cath site 30 minutes before chemotherapy 07/23/21   Curt Bears, MD  mirtazapine (REMERON) 30 MG tablet TAKE 1 TABLET BY MOUTH AT BEDTIME 10/23/21   Curt Bears,  MD  morphine (MS CONTIN) 15 MG 12 hr tablet 1 tablet p.o. in a.m. and 2 tablets p.o. p.m. Patient taking differently: Take 15 mg by mouth every 12 (twelve) hours. 10/12/21   Curt Bears, MD  oxyCODONE (OXY IR/ROXICODONE) 5 MG immediate release tablet Take 1 tablet (5 mg total) by mouth every 6 (six) hours as needed for moderate pain or severe pain. 10/24/21   Pickenpack-Cousar, Carlena Sax, NP  polyethylene glycol (MIRALAX / GLYCOLAX) 17 g packet Take 17 g by mouth daily as needed for mild constipation. 08/07/21   Lavina Hamman, MD  prochlorperazine (COMPAZINE) 10 MG tablet TAKE 1 TABLET BY MOUTH EVERY 6 HOURS AS NEEDED FOR NAUSEA FOR VOMITING 09/03/21   Heilingoetter, Cassandra L, PA-C    Physical Exam: Vitals:   10/31/21 1945 10/31/21 2000 10/31/21 2015 10/31/21 2030  BP:  125/73  125/74  Pulse: (!) 113 (!) 117 (!) 115 (!) 118  Resp: (!) 23 (!) 29 10 (!) 24  Temp:  98 F (36.7 C)    TempSrc:      SpO2: 100% 100% 98% 100%  Weight:      Height:       Constitutional: NAD, calm, comfortable Eyes: PERRL, lids and conjunctivae normal ENMT: Mucous membranes are moist. Posterior pharynx clear of any exudate or lesions.Normal dentition.  Neck: normal, supple, no masses, no thyromegaly Respiratory: Diminished on R  Cardiovascular: Tachycardic Abdomen: no tenderness, no masses palpated. No hepatosplenomegaly. Bowel sounds positive.  Musculoskeletal: no clubbing / cyanosis. No joint deformity upper and lower extremities. Good ROM, no contractures. Normal muscle tone.  Skin: no rashes, lesions, ulcers. No induration Neurologic: CN 2-12 grossly intact. Sensation intact, DTR normal. Strength 5/5 in all 4.  Psychiatric: Normal judgment and insight. Alert and oriented x 3. Normal mood.   Data Reviewed:       Latest Ref Rng & Units 10/31/2021    5:19 PM 10/31/2021    2:46 PM 10/30/2021    4:47 AM  CBC  WBC 4.0 - 10.5 K/uL 21.2  26.3    Hemoglobin 13.0 - 17.0 g/dL 11.3  9.8  9.3   Hematocrit  39.0 - 52.0 % 34.4  29.3  28.5   Platelets 150 - 400 K/uL 57  75         Latest Ref Rng & Units 10/31/2021    5:19 PM 10/31/2021    2:46 PM 10/30/2021    4:47 AM  BMP  Glucose 70 - 99 mg/dL 121  151  77   BUN 6 - 20 mg/dL 20  19  20    Creatinine 0.61 - 1.24 mg/dL 1.31  1.43  1.09   Sodium 135 - 145 mmol/L 134  129  134   Potassium 3.5 - 5.1 mmol/L 3.9  3.7  4.0   Chloride 98 - 111 mmol/L 100  94  101   CO2 22 - 32 mmol/L 24  23  25    Calcium 8.9 - 10.3 mg/dL 8.6  8.4  8.2     CTA chest: IMPRESSION: 1.  No evidence of pulmonary embolism.   2. Stable chronic findings of the right lower lobe volume loss, hydropneumothorax and prominent pleural thickening, unchanged.   3. Mild emphysematous changes. No acute pulmonary process in the residual right upper lobe as well as left lung.    Assessment and Plan: * Acute respiratory failure with hypoxia (HCC) Has sizeable hydropneumothorax (mostly pneumothorax); however, this appears stable and unchanged since CT in mid June (09/27/21).  Thus doubt this is the primary cause of his new hypoxia today. Has new WBC not present on CBC yesterday. WBC with vacuoles and toxic granulation. I am suspicious the hypoxia is secondary to the atelectatic lung hiding a underlying new Pneumonia. PNA pathway Rocephin + azithro Repeat CBC in AM COVID neg Cont pulse ox Tele monitor for tachycardia.  Hydropneumothorax Appears stable and unchanged from June 15th CT. Doubt we will be able to get anyone to put a chest tube in this patient at this time: Stable and unchanged since June: likely not the primary cause of his hypoxia if hypoxia is new onset. On eliquis for DVT in leg Thrombocytopenia Onc already recommending hospice with regards to lung CA as of todays office note. Consider Pulm consult in AM to see if they have a different opinion.  Adenocarcinoma of right lung, stage 4 (HCC) Rapidly progressive recently. Onc recommending hospice in todays  office note. Putting in for pal care consult. Most recent chemo on 7/12 followed by Neulasta on 7/14.  Acute deep vein thrombosis (DVT) of right lower extremity (HCC) Cont eliquis, watch for recurrence of GIB that he was just in hospital for and discharged yesterday.  Decision was made during that admit to resume eliquis despite risks.  Thrombocytopenia ([place]) Likely related to chemotherapy, history of same. Platelets of 57 today. Repeat CBC in AM.  Cancer associated pain PRN IV dilaudid ordered Cont MS contin      Advance Care Planning:   Code Status: Full Code  Full code for now  Consults: Pal care consult put into Epic, Onc being called by EDP  Family Communication: No family in room  Severity of Illness: The appropriate patient status for this patient is OBSERVATION. Observation status is judged to be reasonable and necessary in order to provide the required intensity of service to ensure the patient's safety. The patient's presenting symptoms, physical exam findings, and initial radiographic and laboratory data in the context of their medical condition is felt to place them at decreased risk for further clinical deterioration. Furthermore, it is anticipated that the patient will be medically stable for discharge from the hospital within 2 midnights of admission.   Author: Etta Quill., DO 10/31/2021 8:59 PM  For on call review [email or web address].

## 2021-10-31 NOTE — Assessment & Plan Note (Addendum)
Has sizeable hydropneumothorax (mostly pneumothorax); however, this appears stable and unchanged since CT in mid June (09/27/21).  Thus not clear that this is the primary cause of his new hypoxia today. Has new WBC not present on CBC yesterday. WBC with vacuoles and toxic granulation. I am suspicious the hypoxia is secondary to the atelectatic lung hiding a underlying new Pneumonia. 1. PNA pathway 2. Rocephin + azithro 3. Repeat CBC in AM 4. COVID neg 5. Cont pulse ox 6. Tele monitor for tachycardia. 7. Consider pulm consult in AM re-hydropneumothorax (see also below)

## 2021-10-31 NOTE — Assessment & Plan Note (Signed)
PRN IV dilaudid ordered Cont MS contin

## 2021-10-31 NOTE — ED Notes (Signed)
Called lab per EDP Long request regarding pending CBC with diff since 1719. Per lab tech, staff to see results shortly.

## 2021-10-31 NOTE — Assessment & Plan Note (Signed)
Appears stable and unchanged from June 15th CT. Doubt we will be able to get anyone to put a chest tube in this patient at this time: 1. Stable and unchanged since June: likely not the primary cause of his hypoxia if hypoxia is new onset. 2. On eliquis for DVT in leg 3. Thrombocytopenia 4. Onc already recommending hospice with regards to lung CA as of todays office note. Consider Pulm consult in AM to see if they have a different opinion.

## 2021-11-01 ENCOUNTER — Inpatient Hospital Stay (HOSPITAL_COMMUNITY): Payer: Commercial Managed Care - PPO

## 2021-11-01 ENCOUNTER — Encounter (HOSPITAL_COMMUNITY): Payer: Self-pay | Admitting: Internal Medicine

## 2021-11-01 DIAGNOSIS — D61818 Other pancytopenia: Secondary | ICD-10-CM | POA: Diagnosis present

## 2021-11-01 DIAGNOSIS — Z20822 Contact with and (suspected) exposure to covid-19: Secondary | ICD-10-CM | POA: Diagnosis present

## 2021-11-01 DIAGNOSIS — J910 Malignant pleural effusion: Secondary | ICD-10-CM | POA: Diagnosis present

## 2021-11-01 DIAGNOSIS — G893 Neoplasm related pain (acute) (chronic): Secondary | ICD-10-CM | POA: Diagnosis present

## 2021-11-01 DIAGNOSIS — J9601 Acute respiratory failure with hypoxia: Secondary | ICD-10-CM | POA: Diagnosis present

## 2021-11-01 DIAGNOSIS — R180 Malignant ascites: Secondary | ICD-10-CM | POA: Diagnosis present

## 2021-11-01 DIAGNOSIS — F419 Anxiety disorder, unspecified: Secondary | ICD-10-CM | POA: Diagnosis present

## 2021-11-01 DIAGNOSIS — Z720 Tobacco use: Secondary | ICD-10-CM | POA: Diagnosis not present

## 2021-11-01 DIAGNOSIS — C786 Secondary malignant neoplasm of retroperitoneum and peritoneum: Secondary | ICD-10-CM | POA: Diagnosis present

## 2021-11-01 DIAGNOSIS — C3491 Malignant neoplasm of unspecified part of right bronchus or lung: Secondary | ICD-10-CM | POA: Diagnosis present

## 2021-11-01 DIAGNOSIS — K649 Unspecified hemorrhoids: Secondary | ICD-10-CM | POA: Diagnosis present

## 2021-11-01 DIAGNOSIS — K921 Melena: Secondary | ICD-10-CM | POA: Diagnosis present

## 2021-11-01 DIAGNOSIS — Z66 Do not resuscitate: Secondary | ICD-10-CM | POA: Diagnosis present

## 2021-11-01 DIAGNOSIS — I82561 Chronic embolism and thrombosis of right calf muscular vein: Secondary | ICD-10-CM | POA: Diagnosis present

## 2021-11-01 DIAGNOSIS — I82491 Acute embolism and thrombosis of other specified deep vein of right lower extremity: Secondary | ICD-10-CM | POA: Diagnosis not present

## 2021-11-01 DIAGNOSIS — D735 Infarction of spleen: Secondary | ICD-10-CM | POA: Diagnosis present

## 2021-11-01 DIAGNOSIS — J9811 Atelectasis: Secondary | ICD-10-CM | POA: Diagnosis present

## 2021-11-01 DIAGNOSIS — D6959 Other secondary thrombocytopenia: Secondary | ICD-10-CM | POA: Diagnosis present

## 2021-11-01 DIAGNOSIS — Z515 Encounter for palliative care: Secondary | ICD-10-CM | POA: Diagnosis not present

## 2021-11-01 DIAGNOSIS — J939 Pneumothorax, unspecified: Secondary | ICD-10-CM | POA: Diagnosis present

## 2021-11-01 DIAGNOSIS — T451X5A Adverse effect of antineoplastic and immunosuppressive drugs, initial encounter: Secondary | ICD-10-CM | POA: Diagnosis present

## 2021-11-01 DIAGNOSIS — R000 Tachycardia, unspecified: Secondary | ICD-10-CM | POA: Diagnosis present

## 2021-11-01 DIAGNOSIS — J948 Other specified pleural conditions: Secondary | ICD-10-CM | POA: Diagnosis present

## 2021-11-01 DIAGNOSIS — R530 Neoplastic (malignant) related fatigue: Secondary | ICD-10-CM | POA: Diagnosis present

## 2021-11-01 DIAGNOSIS — J432 Centrilobular emphysema: Secondary | ICD-10-CM | POA: Diagnosis present

## 2021-11-01 DIAGNOSIS — R0902 Hypoxemia: Secondary | ICD-10-CM | POA: Diagnosis present

## 2021-11-01 LAB — LACTATE DEHYDROGENASE, PLEURAL OR PERITONEAL FLUID: LD, Fluid: 655 U/L — ABNORMAL HIGH (ref 3–23)

## 2021-11-01 LAB — CBC
HCT: 28.6 % — ABNORMAL LOW (ref 39.0–52.0)
Hemoglobin: 9.2 g/dL — ABNORMAL LOW (ref 13.0–17.0)
MCH: 28.3 pg (ref 26.0–34.0)
MCHC: 32.2 g/dL (ref 30.0–36.0)
MCV: 88 fL (ref 80.0–100.0)
Platelets: 61 10*3/uL — ABNORMAL LOW (ref 150–400)
RBC: 3.25 MIL/uL — ABNORMAL LOW (ref 4.22–5.81)
RDW: 24.8 % — ABNORMAL HIGH (ref 11.5–15.5)
WBC: 28.4 10*3/uL — ABNORMAL HIGH (ref 4.0–10.5)
nRBC: 1.2 % — ABNORMAL HIGH (ref 0.0–0.2)

## 2021-11-01 LAB — BODY FLUID CELL COUNT WITH DIFFERENTIAL
Eos, Fluid: 0 %
Lymphs, Fluid: 21 %
Monocyte-Macrophage-Serous Fluid: 42 % — ABNORMAL LOW (ref 50–90)
Neutrophil Count, Fluid: 37 % — ABNORMAL HIGH (ref 0–25)
Total Nucleated Cell Count, Fluid: 774 cu mm (ref 0–1000)

## 2021-11-01 LAB — ALBUMIN, PLEURAL OR PERITONEAL FLUID: Albumin, Fluid: 1.9 g/dL

## 2021-11-01 LAB — BASIC METABOLIC PANEL
Anion gap: 9 (ref 5–15)
BUN: 19 mg/dL (ref 6–20)
CO2: 26 mmol/L (ref 22–32)
Calcium: 8.4 mg/dL — ABNORMAL LOW (ref 8.9–10.3)
Chloride: 101 mmol/L (ref 98–111)
Creatinine, Ser: 1.5 mg/dL — ABNORMAL HIGH (ref 0.61–1.24)
GFR, Estimated: 56 mL/min — ABNORMAL LOW (ref >60)
Glucose, Bld: 104 mg/dL — ABNORMAL HIGH (ref 70–99)
Potassium: 4.2 mmol/L (ref 3.5–5.1)
Sodium: 136 mmol/L (ref 135–145)

## 2021-11-01 LAB — PROTEIN, PLEURAL OR PERITONEAL FLUID: Total protein, fluid: 3.5 g/dL

## 2021-11-01 LAB — LACTIC ACID, PLASMA: Lactic Acid, Venous: 1.3 mmol/L (ref 0.5–1.9)

## 2021-11-01 MED ORDER — IPRATROPIUM-ALBUTEROL 0.5-2.5 (3) MG/3ML IN SOLN: 3.0000 mL | Freq: Four times a day (QID) | RESPIRATORY_TRACT | Status: DC | PRN

## 2021-11-01 MED ORDER — DRONABINOL 2.5 MG PO CAPS
2.5000 mg | ORAL_CAPSULE | Freq: Two times a day (BID) | ORAL | Status: DC
  Administered 2021-11-01 – 2021-11-02 (×2): 2.5 mg via ORAL
  Filled 2021-11-01 (×2): qty 1

## 2021-11-01 MED ORDER — LIDOCAINE HCL 1 % IJ SOLN
INTRAMUSCULAR | Status: AC
  Administered 2021-11-01: 10 mL
  Filled 2021-11-01: qty 20

## 2021-11-01 MED ORDER — CHLORHEXIDINE GLUCONATE CLOTH 2 % EX PADS
6.0000 | MEDICATED_PAD | Freq: Every day | CUTANEOUS | Status: DC
Start: 2021-11-01 — End: 2021-11-02
  Administered 2021-11-01 – 2021-11-02 (×2): 6 via TOPICAL

## 2021-11-01 MED ORDER — ALPRAZOLAM 0.5 MG PO TABS
0.5000 mg | ORAL_TABLET | Freq: Three times a day (TID) | ORAL | Status: DC | PRN
  Administered 2021-11-01: 0.5 mg via ORAL
  Filled 2021-11-01: qty 1

## 2021-11-01 MED ORDER — ORAL CARE MOUTH RINSE: 15.0000 mL | OROMUCOSAL | Status: DC | PRN

## 2021-11-01 NOTE — Plan of Care (Signed)
  Problem: Education: Goal: Knowledge of General Education information will improve Description Including pain rating scale, medication(s)/side effects and non-pharmacologic comfort measures Outcome: Progressing   

## 2021-11-01 NOTE — Progress Notes (Signed)
PROGRESS NOTE    [name] Potts  DJM:[id number] DOB: 1970-04-21 DOA: 10/31/2021 PCP: [name], MD    Brief Narrative:  51 year old male with a history of non-small cell lung cancer on chemotherapy, previous history of DVT on anticoagulation, was admitted to the hospital 7/17 through 7/18 for rectal bleeding.  After returning home, he followed up at the cancer clinic for routine visit on 7/19 and was noted to be hypoxic on vitals check.  Sent to the ER for evaluation.  CT chest negative for pulmonary embolus.  It did show chronic right hydropneumothorax.  It was noted that he was developing significant ascites which may also be playing a part.   Assessment & Plan:   Principal Problem:   Acute respiratory failure with hypoxia (HCC) Active Problems:   Hydropneumothorax   Adenocarcinoma of right lung, stage 4 (HCC)   Acute deep vein thrombosis (DVT) of right lower extremity (HCC)   Thrombocytopenia (HCC)   Cancer associated pain   Acute respiratory failure with hypoxia -CT chest was negative for pulmonary embolus -Chronic right hydropneumothorax appears to be unchanged from prior imaging -His worsening ascites may be playing a part in his shortness of breath -We will request IR for paracentesis -Since he does have new hypoxia and abnormal findings on CT chest, will request pulmonology opinion as well. -He has been started on antibiotics for possible underlying pneumonia  Chronic hydropneumothorax -Noted on CT imaging from 09/2021 -Does not appear to be clinically change since that -He does have new hypoxia and reports worsening cough -He has been started on antibiotics -We will request pulmonology input  Stage IV adenocarcinoma of right lung -Followed by oncology, has been receiving chemo -Oncology notes from 7/19 indicate that he has a very aggressive, rapidly progressive cancer that does not appear to be responding to chemo -Recommendations per oncology were for hospice/comfort  care -Patient/family are working on processing these decisions -Palliative care has been consulted  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 -Initially diagnosed 07/2021 -Images repeated 10/30/2021 that did show chronic thrombus that was mostly in the gastrocnemius muscles and appears to be improving from prior imaging -Consider his ongoing issues with GI bleeding, will hold anticoagulation for at least 48 hours  Rectal bleeding -Recent admission for the same -We will hold anticoagulation for now -If he has continued bleeding off of anticoagulation, can consider repeat GI evaluation to see if colonoscopy is indicated -last dose of eliquis was AM 7/20  Thrombocytopenia -Suspect is related to chemotherapy -He did receive a unit of platelets on his last admission -Continue to monitor  Leukocytosis -Possibly related to recent Neulasta injection -He is on antibiotics for possible underlying pneumonia -Continue to monitor  Ascites -Suspect this is secondary to peritoneal metastatic disease -Requested paracentesis  Anxiety -Use Xanax as needed   DVT prophylaxis:   Apixaban  Code Status: DNR Family Communication: Discussed with wife at the bedside Disposition Plan: Status is: Observation The patient remains OBS appropriate and will d/c before 2 midnights.     Consultants:  Palliative care  Procedures:    Antimicrobials:  Ceftriaxone 7/19 > Azithromycin 7/19 >   Subjective: Reports that he has noticed increased cough for the past few days.  His wife reports that she has noticed increasing dyspnea on exertion for the past 4 days.  Reports having a blood-streaked stool this morning.  Objective: Vitals:   11/01/21 0800 11/01/21 1018 11/01/21 1042 11/01/21 1145  BP: 111/67 128/73  116/74  Pulse: Marland Kitchen)  121 (!) 117  (!) 108  Resp: (!) 30 (!) 22  16  Temp:   97.8 F (36.6 C)   TempSrc:   Oral   SpO2: 98% 98%  99%  Weight:      Height:         Intake/Output Summary (Last 24 hours) at 11/01/2021 1147 Last data filed at 11/01/2021 4081 Gross per 24 hour  Intake --  Output 380 ml  Net -380 ml   Filed Weights   10/31/21 1620  Weight: 77.8 kg    Examination:  General exam: Appears calm and comfortable  Respiratory system: Bronchial breath sounds at right base. Respiratory effort normal. Cardiovascular system: S1 & S2 heard, RRR. No JVD, murmurs, rubs, gallops or clicks. No pedal edema. Gastrointestinal system: Abdomen is distended, soft and nontender. No organomegaly or masses felt. Normal bowel sounds heard. Central nervous system: Alert and oriented. No focal neurological deficits. Extremities: Symmetric 5 x 5 power. Skin: No rashes, lesions or ulcers Psychiatry: Judgement and insight appear normal. Mood & affect appropriate.     Data Reviewed: I have personally reviewed following labs and imaging studies  CBC: Recent Labs  Lab 10/29/21 1530 10/30/21 0350 10/30/21 0447 10/31/21 1446 10/31/21 1719 11/01/21 0513  WBC 10.3 4.9  --  26.3* 21.2* 28.4*  NEUTROABS  --   --   --  18.0* 17.0*  --   HGB 10.4* 8.7* 9.3* 9.8* 11.3* 9.2*  HCT 31.3* 26.4* 28.5* 29.3* 34.4* 28.6*  MCV 86.7 87.7  --  84.7 86.4 88.0  PLT 33* 50*  --  75* 57* 61*   Basic Metabolic Panel: Recent Labs  Lab 10/29/21 1530 10/30/21 0447 10/31/21 1446 10/31/21 1719 11/01/21 0513  NA 132* 134* 129* 134* 136  K 3.8 4.0 3.7 3.9 4.2  CL 99 101 94* 100 101  CO2 25 25 23 24 26   GLUCOSE 94 77 151* 121* 104*  BUN 23* 20 19 20 19   CREATININE 1.25* 1.09 1.43* 1.31* 1.50*  CALCIUM 9.4 8.2* 8.4* 8.6* 8.4*  MG  --  1.5*  --   --   --    GFR: Estimated Creatinine Clearance: 63.2 mL/min (A) (by C-G formula based on SCr of 1.5 mg/dL (H)). Liver Function Tests: Recent Labs  Lab 10/29/21 1530 10/30/21 0447 10/31/21 1446 10/31/21 1719  AST 49* 35 55* 55*  ALT 41 30 35 34  ALKPHOS 132* 101 178* 175*  BILITOT 0.9 0.7 0.7 0.8  PROT 6.6 5.5*  6.2* 6.1*  ALBUMIN 3.1* 2.7* 2.8* 2.8*   Recent Labs  Lab 10/29/21 1530  LIPASE 22   No results for input(s): "AMMONIA" in the last 168 hours. Coagulation Profile: No results for input(s): "INR", "PROTIME" in the last 168 hours. Cardiac Enzymes: Recent Labs  Lab 10/30/21 0447  CKTOTAL 29*   BNP (last 3 results) No results for input(s): "PROBNP" in the last 8760 hours. HbA1C: No results for input(s): "HGBA1C" in the last 72 hours. CBG: No results for input(s): "GLUCAP" in the last 168 hours. Lipid Profile: No results for input(s): "CHOL", "HDL", "LDLCALC", "TRIG", "CHOLHDL", "LDLDIRECT" in the last 72 hours. Thyroid Function Tests: No results for input(s): "TSH", "T4TOTAL", "FREET4", "T3FREE", "THYROIDAB" in the last 72 hours. Anemia Panel: No results for input(s): "VITAMINB12", "FOLATE", "FERRITIN", "TIBC", "IRON", "RETICCTPCT" in the last 72 hours. Sepsis Labs: Recent Labs  Lab 10/31/21 2058 11/01/21 0513  LATICACIDVEN 1.9 1.3    Recent Results (from the past 240 hour(s))  Resp Panel  by RT-PCR (Flu A&B, Covid) Anterior Nasal Swab     Status: None   Collection Time: 10/31/21  5:19 PM   Specimen: Anterior Nasal Swab  Result Value Ref Range Status   SARS Coronavirus 2 by RT PCR NEGATIVE NEGATIVE Final    Comment: (NOTE) SARS-CoV-2 target nucleic acids are NOT DETECTED.  The SARS-CoV-2 RNA is generally detectable in upper respiratory specimens during the acute phase of infection. The lowest concentration of SARS-CoV-2 viral copies this assay can detect is 138 copies/mL. A negative result does not preclude SARS-Cov-2 infection and should not be used as the sole basis for treatment or other patient management decisions. A negative result may occur with  improper specimen collection/handling, submission of specimen other than nasopharyngeal swab, presence of viral mutation(s) within the areas targeted by this assay, and inadequate number of viral copies(<138  copies/mL). A negative result must be combined with clinical observations, patient history, and epidemiological information. The expected result is Negative.  Fact Sheet for Patients:  EntrepreneurPulse.com.au  Fact Sheet for Healthcare Providers:  IncredibleEmployment.be  This test is no t yet approved or cleared by the [place] FDA and  has been authorized for detection and/or diagnosis of SARS-CoV-2 by FDA under an Emergency Use Authorization (EUA). This EUA will remain  in effect (meaning this test can be used) for the duration of the COVID-19 declaration under Section 564(b)(1) of the Act, 21 U.S.C.section 360bbb-3(b)(1), unless the authorization is terminated  or revoked sooner.       Influenza A by PCR NEGATIVE NEGATIVE Final   Influenza B by PCR NEGATIVE NEGATIVE Final    Comment: (NOTE) The Xpert Xpress SARS-CoV-2/FLU/RSV plus assay is intended as an aid in the diagnosis of influenza from Nasopharyngeal swab specimens and should not be used as a sole basis for treatment. Nasal washings and aspirates are unacceptable for Xpert Xpress SARS-CoV-2/FLU/RSV testing.  Fact Sheet for Patients: EntrepreneurPulse.com.au  Fact Sheet for Healthcare Providers: IncredibleEmployment.be  This test is not yet approved or cleared by the [place] FDA and has been authorized for detection and/or diagnosis of SARS-CoV-2 by FDA under an Emergency Use Authorization (EUA). This EUA will remain in effect (meaning this test can be used) for the duration of the COVID-19 declaration under Section 564(b)(1) of the Act, 21 U.S.C. section 360bbb-3(b)(1), unless the authorization is terminated or revoked.  Performed at [facility], [place] [address] [place], [place] [address]   Culture, blood (routine x 2)     Status: None (Preliminary result)   Collection Time: 10/31/21  8:55 PM   Specimen:  BLOOD RIGHT HAND  Result Value Ref Range Status   Specimen Description   Final    BLOOD RIGHT HAND Performed at [facility] [address] [place], [place] [address]    Special Requests   Final    BOTTLES DRAWN AEROBIC AND ANAEROBIC Blood Culture adequate volume Performed at [facility] [address], [place], [place] [address]    Culture   Final    NO GROWTH < 12 HOURS Performed at [facility] [address], [place], [place] [address]    Report Status PENDING  Incomplete  Culture, blood (routine x 2)     Status: None (Preliminary result)   Collection Time: 10/31/21  8:58 PM   Specimen: BLOOD  Result Value Ref Range Status   Specimen Description   Final    BLOOD RIGHT ANTECUBITAL Performed at [facility],  [place] [address] [place], [place] [address]    Special Requests   Final    BOTTLES DRAWN AEROBIC AND ANAEROBIC Blood Culture results may not be optimal due to an excessive volume of blood received in culture bottles Performed at [facility] [address], [place], [place] [address]    Culture   Final    NO GROWTH < 12 HOURS Performed at [facility] [address], [place], [place] [address]    Report Status PENDING  Incomplete         Radiology Studies: CT Angio Chest PE W and/or Wo Contrast  Result Date: 10/31/2021 CLINICAL DATA:  Patient arriving from cancer center due to hypoxia. History of lung cancer. EXAM: CT ANGIOGRAPHY CHEST WITH CONTRAST TECHNIQUE: Multidetector CT imaging of the chest was performed using the standard protocol during bolus administration of intravenous contrast. Multiplanar CT image reconstructions and MIPs were obtained to evaluate the vascular anatomy. RADIATION DOSE REDUCTION: This exam was performed according to the departmental dose-optimization program which includes automated exposure control, adjustment of the mA and/or kV according to  patient size and/or use of iterative reconstruction technique. CONTRAST:  65mL OMNIPAQUE IOHEXOL 350 MG/ML SOLN COMPARISON:  CT examination dated September 27, 2021. FINDINGS: Cardiovascular: Satisfactory opacification of the pulmonary arteries to the segmental level. No evidence of pulmonary embolism. Normal heart size. No pericardial effusion. Mediastinum/Nodes: No enlarged mediastinal, hilar, or axillary lymph nodes. Thyroid gland, trachea, and esophagus demonstrate no significant findings. Lungs/Pleura: Large right lower lobe hydropneumothorax with pleural thickening is unchanged. Atelectasis and fibrotic changes of the right upper lobe, unchanged. Left lung is clear. No evidence of pneumonia or focal consolidation. Upper Abdomen: Moderate ascites in bilateral upper quadrants. Musculoskeletal: No chest wall abnormality. No acute or significant osseous findings. Review of the MIP images confirms the above findings. IMPRESSION: 1.  No evidence of pulmonary embolism. 2. Stable chronic findings of the right lower lobe volume loss, hydropneumothorax and prominent pleural thickening, unchanged. 3. Mild emphysematous changes. No acute pulmonary process in the residual right upper lobe as well as left lung. Aortic Atherosclerosis (ICD10-I70.0) and Emphysema (ICD10-J43.9). Electronically Signed   By: Keane Police D.O.   On: 10/31/2021 18:40   DG Chest 2 View  Result Date: 10/31/2021 CLINICAL DATA:  Short of breath, small cell lung cancer, malignant right pleural effusion EXAM: CHEST - 2 VIEW COMPARISON:  08/28/2021, 10/29/2021 FINDINGS: Frontal and lateral views of the chest demonstrates stable left chest wall port. Cardiac silhouette unchanged. Stable right-sided hydropneumothorax with gas fluid level again seen at the right lung base. Chronic right perihilar consolidation greatest in the right lower lobe. Left chest is clear. No acute bony abnormality. IMPRESSION: 1. Stable right hydropneumothorax. 2. Chronic right  perihilar consolidation, consistent with atelectasis and underlying malignancy. Electronically Signed   By: Randa Ngo M.D.   On: 10/31/2021 17:21        Scheduled Meds:  azithromycin  500 mg Oral QHS   mirtazapine  30 mg Oral QHS   morphine  30 mg Oral QHS   And   morphine  15 mg Oral q morning   Continuous Infusions:  cefTRIAXone (ROCEPHIN)  IV Stopped (10/31/21 2143)     LOS: 0 days    Time spent: 9mins    Kathie Dike, MD [facility]   If 7PM-7AM, please contact night-coverage [email or web address]  11/01/2021, 11:47 AM

## 2021-11-01 NOTE — Consult Note (Signed)
NAME:  [name] Potts, MRN:  [id number], DOB:  [birth year], LOS: 0 ADMISSION DATE:  10/31/2021, CONSULTATION DATE:  11/01/21 REFERRING MD:  Dr. Roderic Palau, CHIEF COMPLAINT:  hypoxia   History of Present Illness:   51 year old male with history of former smoker (quit 02/2021, 1/2 ppd max for 21 yrs), stage IV metastatic NSCLC (RLL nodule in addition to right paratracheal and subcarinal lymphadenopathy, diagnosed 06/2021, underwent VATS by Dr. Roxan Hockey) on 2nd line chemo, docetaxel and cyramza, s/p cycle 1, last treatment 7/12 with Neulasta on 7/14 followed by Dr. Julien Nordmann, and RLE DVT on Eliquis sent from oncology office on 7/19 due to hypoxia.  In office, reported O2 sats 84% on RA then down to 50% on ambulation.  Oncology notes 7/19 indicate rapid disease progression despite chemo thus far.  Patient recently admitted 7/17 to 7/18 for rectal bleeding, felt to be hemorrhoidal.  GI recommended holding Eliquis for one week but given continued age-indeterminate RLL DVT, after risk/ benefit ratio, patient elected to continue taking Eliquis.    Patient denies any recent fever, chills, chest pain or tightness, wheezing, or productive cough.  Only complaint is abdominal distention.  Does report issues with seasonal allergies and post nasal drip that he is on zyrtec for.  Wife reports he started having some exertional dyspnea on Monday accompanied when the abdominal distention started.  In ER, he has been afebrile and only requiring 2L [place] for saturations > 96%.  Noted leukocytosis of 28k (s/p neulasta 7/14), and Hgb near baseline 9.2.  Empirically started on ceftriaxone and azithromycin.  CTA PE was negative for PE, noted stable right hydropneumothorax and RLL volume loss with mild emphysematous changes.  On previous admit, CT a/p noted increased abdominopelvic ascites, moderate volume, increased soft tissue infiltration and nodularity of mesentery and omentum consistent with progressive peritoneal metastatic disease, and  age-indeterminate multiple splenic infarcts.  Patient admitted to [facility] and IR consulted for paracentesis.  Pulmonary consulted given new hypoxia requirement.    Pertinent  Medical History  Former smoker, Metastatic NSCLC stage IV (RLL nodule in addition to right paratracheal and subcarinal lymphadenopathy, diagnosed 06/2021, on chemo, malignant right pleural effusion, RLE DVT on Eliquis  Significant Hospital Events: Including procedures, antibiotic start and stop dates in addition to other pertinent events   7/19 admitted to [facility]  Interim History / Subjective:  Currently on room air at 98%.   Objective   Blood pressure 116/74, pulse (!) 108, temperature 97.8 F (36.6 C), temperature source Oral, resp. rate 16, height 6' (1.829 m), weight 77.8 kg, SpO2 99 %.        Intake/Output Summary (Last 24 hours) at 11/01/2021 1216 Last data filed at 11/01/2021 7829 Gross per 24 hour  Intake --  Output 380 ml  Net -380 ml   Filed Weights   10/31/21 1620  Weight: 77.8 kg   Examination: General:  chronically ill appearing adult male sitting upright on ER stretcher in NAD Neuro:  Alert, appropriate, MAE, non focal CV: rr, ST, port left chest accessed PULM:  non labored, clear, no breath sounds RLL, diminished LLL GI:  abd distended/ taut, +fluid wave, +bs Extremities: warm/dry, no LE edema   Resolved Hospital Problem list    Assessment & Plan:   Acute hypoxic respiratory failure Emphysematous changes on CT  Former smoker quit 02/2021 - likely driven by restrictive component from progressive ascites/ abdominal distention.  IR has been consulted for paracentesis. - PE ruled out - stable Right hydropneumothorax -  CTA PE showing mild emphysematous changes.  Will add duonebs prn to see if this helps.  If patient is interested, he can follow up in our clinic for further PFTs/ testing.  - currently he is not wearing his nasal cannula and is 98% on room air.   - recs for ambulatory walk test,  after paracentesis and prior to discharge.   - no obvious pulmonary infection on CT.  Cough likely secondary to postnasal drip/ allergies.  Leukocytosis likely 2/ 2 neulasta.  Recommend clinical monitoring off abx.   - no other pulmonary etiology for hypoxia at this time  Pulmonary available as needed.  Please call us back if we can be of further assistance.   Chronic hydropneumothorax - stable.  No indication for thoracentesis.  Monitor  Stage IV metastatic NSCLC - followed by Dr. Julien Nordmann.  7/19 oncology notes indicate rapidly progressive cancer despite chemo.  S/p 1 round of second line chemo, docetaxel and cyramza, on 7/12 with Neulasta on 7/14 - DNR on admit.  PMT consulted.   RLE DVT on Eliquis - per primary   Best Practice (right click and "Reselect all SmartList Selections" daily)  Per primary team   Labs   CBC: Recent Labs  Lab 10/29/21 1530 10/30/21 0350 10/30/21 0447 10/31/21 1446 10/31/21 1719 11/01/21 0513  WBC 10.3 4.9  --  26.3* 21.2* 28.4*  NEUTROABS  --   --   --  18.0* 17.0*  --   HGB 10.4* 8.7* 9.3* 9.8* 11.3* 9.2*  HCT 31.3* 26.4* 28.5* 29.3* 34.4* 28.6*  MCV 86.7 87.7  --  84.7 86.4 88.0  PLT 33* 50*  --  75* 57* 61*    Basic Metabolic Panel: Recent Labs  Lab 10/29/21 1530 10/30/21 0447 10/31/21 1446 10/31/21 1719 11/01/21 0513  NA 132* 134* 129* 134* 136  K 3.8 4.0 3.7 3.9 4.2  CL 99 101 94* 100 101  CO2 25 25 23 24 26   GLUCOSE 94 77 151* 121* 104*  BUN 23* 20 19 20 19   CREATININE 1.25* 1.09 1.43* 1.31* 1.50*  CALCIUM 9.4 8.2* 8.4* 8.6* 8.4*  MG  --  1.5*  --   --   --    GFR: Estimated Creatinine Clearance: 63.2 mL/min (A) (by C-G formula based on SCr of 1.5 mg/dL (H)). Recent Labs  Lab 10/30/21 0350 10/31/21 1446 10/31/21 1719 10/31/21 2058 11/01/21 0513  WBC 4.9 26.3* 21.2*  --  28.4*  LATICACIDVEN  --   --   --  1.9 1.3    Liver Function Tests: Recent Labs  Lab 10/29/21 1530 10/30/21 0447 10/31/21 1446 10/31/21 1719   AST 49* 35 55* 55*  ALT 41 30 35 34  ALKPHOS 132* 101 178* 175*  BILITOT 0.9 0.7 0.7 0.8  PROT 6.6 5.5* 6.2* 6.1*  ALBUMIN 3.1* 2.7* 2.8* 2.8*   Recent Labs  Lab 10/29/21 1530  LIPASE 22   No results for input(s): "AMMONIA" in the last 168 hours.  ABG    Component Value Date/Time   PHART 7.43 06/25/2021 1010   PCO2ART 36 06/25/2021 1010   PO2ART 96 06/25/2021 1010   HCO3 23.9 06/25/2021 1010   ACIDBASEDEF 0.1 06/25/2021 1010   O2SAT 98.5 06/25/2021 1010     Coagulation Profile: No results for input(s): "INR", "PROTIME" in the last 168 hours.  Cardiac Enzymes: Recent Labs  Lab 10/30/21 0447  CKTOTAL 29*    HbA1C: No results found for: "HGBA1C"  CBG: No results for input(s): "GLUCAP"  in the last 168 hours.  Review of Systems:   Review of Systems  Constitutional:  Positive for malaise/fatigue. Negative for chills and fever.  Respiratory:  Positive for shortness of breath. Negative for cough, sputum production and wheezing.   Cardiovascular:  Negative for chest pain and leg swelling.  Endo/Heme/Allergies:  Positive for environmental allergies.  Post nasal drip/ throat irritation   Past Medical History:  He,  has a past medical history of ADHD (attention deficit hyperactivity disorder), Chronic back pain, Complication of anesthesia, Constipation, Dyspnea, Pneumonia, and Seasonal allergies.   Surgical History:   Past Surgical History:  Procedure Laterality Date   COLONOSCOPY  10/11/2011   Procedure: COLONOSCOPY;  Surgeon: Daneil Dolin, MD;  Location: [facility];  Service: Endoscopy;  Laterality: N/A;  1:45 PM   COLONOSCOPY N/A 01/23/2018   Procedure: COLONOSCOPY;  Surgeon: Daneil Dolin, MD;  Location: [facility];  Service: Endoscopy;  Laterality: N/A;  1:00   IR IMAGING GUIDED PORT INSERTION  07/27/2021   NECK SURGERY     Apr 05, 2021- per pt report cervical surgery at day surgery center   POLYPECTOMY  01/23/2018   Procedure: POLYPECTOMY;   Surgeon: Daneil Dolin, MD;  Location: [facility];  Service: Endoscopy;;   VIDEO ASSISTED THORACOSCOPY (VATS)/DECORTICATION Right 06/27/2021   Procedure: VIDEO ASSISTED THORACOSCOPY (VATS), Drainage of RIGHT pleural effusion/ DECORTICATION;  Surgeon: Melrose Nakayama, MD;  [facility];  Service: Thoracic;  Laterality: Right;     Social History:   reports that he quit smoking about 7 months ago. His smoking use included cigarettes. He smoked an average of .5 packs per day. His smokeless tobacco use includes snuff. He reports that he does not currently use alcohol. He reports that he does not currently use drugs after having used the following drugs: Marijuana.   Family History:  His family history includes Colon cancer (age of onset: 59) in his mother; Stroke in his father.   Allergies No Known Allergies   Home Medications  Prior to Admission medications   Medication Sig Start Date End Date Taking? Authorizing Provider  apixaban (ELIQUIS) 5 MG TABS tablet Take 1 tablet (5 mg total) by mouth 2 (two) times daily. 10/11/21  Yes Barrett, Erin R, PA-C  cetirizine (ZYRTEC) 10 MG tablet Take 10 mg by mouth in the morning.   Yes [provider]  dexamethasone (DECADRON) 4 MG tablet Please take two tablets twice a day the day before chemo, the day of chemotherapy, and the day after chemotherapy Patient taking differently: Take 8 mg by mouth See admin instructions. Take 8 mg by mouth two times a day the day before, day of, and day after chemo 10/09/21  Yes Heilingoetter, Cassandra L, PA-C  diazepam (VALIUM) 2 MG tablet Take 1 tablet (2 mg total) by mouth every 12 (twelve) hours as needed for anxiety. Patient taking differently: Take 2-4 mg by mouth every 12 (twelve) hours as needed for anxiety. 10/24/21  Yes Pickenpack-Cousar, Carlena Sax, NP  dronabinol (MARINOL) 2.5 MG capsule Take 1 capsule (2.5 mg total) by mouth 2 (two) times daily before a meal. 10/18/21  Yes Curt Bears, MD   FeFum-FePoly-FA-B Cmp-C-Biot (INTEGRA PLUS) CAPS Take 1 capsule by mouth daily. 07/23/21  Yes Curt Bears, MD  folic acid (FOLVITE) 1 MG tablet Take 1 tablet (1 mg total) by mouth daily. 07/23/21  Yes Curt Bears, MD  lidocaine-prilocaine (EMLA) cream Apply to the Port-A-Cath site 30 minutes before chemotherapy Patient  taking differently: Apply 1 Application topically See admin instructions. Apply to the Port-A-Cath site 30 minutes before chemotherapy 07/23/21  Yes Curt Bears, MD  mirtazapine (REMERON) 30 MG tablet TAKE 1 TABLET BY MOUTH AT BEDTIME 10/23/21  Yes Curt Bears, MD  morphine (MS CONTIN) 15 MG 12 hr tablet 1 tablet p.o. in a.m. and 2 tablets p.o. p.m. Patient taking differently: Take 15-30 mg by mouth every 12 (twelve) hours. 10/12/21  Yes Curt Bears, MD  oxyCODONE (OXY IR/ROXICODONE) 5 MG immediate release tablet Take 1 tablet (5 mg total) by mouth every 6 (six) hours as needed for moderate pain or severe pain. Patient taking differently: Take 5 mg by mouth every 6 (six) hours as needed (for breakthrough pain). 10/24/21  Yes Pickenpack-Cousar, Carlena Sax, NP  polyethylene glycol (MIRALAX / GLYCOLAX) 17 g packet Take 17 g by mouth daily as needed for mild constipation. 08/07/21  Yes Lavina Hamman, MD  prochlorperazine (COMPAZINE) 10 MG tablet TAKE 1 TABLET BY MOUTH EVERY 6 HOURS AS NEEDED FOR NAUSEA FOR VOMITING Patient taking differently: Take 10 mg by mouth every 6 (six) hours. 09/03/21  Yes Heilingoetter, Cassandra L, PA-C     Critical care time: n/a       Kennieth Rad, ACNP [facility] 11/01/2021, 1:59 PM  See Amion for pager If no response to pager, please call PCCM consult pager After 7:00 pm call Elink

## 2021-11-01 NOTE — ED Notes (Signed)
Pt got up to use the urinal at bedside NT and RN on standby. On excursion, pt became tachycardic from HR 105 to 130s and labored breathing. 2 L of supplemental oxygen continue with decreased Spo2 from 99% to 92-93%. Pt currently assisted to bedside commode. Call light within reach.

## 2021-11-01 NOTE — Plan of Care (Signed)
  Problem: Activity: Goal: Risk for activity intolerance will decrease Outcome: Progressing   

## 2021-11-01 NOTE — ED Notes (Signed)
Pt had a BM with a small streak of bright red blood. Informed to report to RN if noticing any additional bleeding. Will report to oncoming RN.

## 2021-11-01 NOTE — Procedures (Signed)
PROCEDURE SUMMARY:  Successful US guided paracentesis from RLQ.  Yielded 3.2L of ascitic fluid.  No immediate complications.  Pt tolerated well.   Specimen was sent for labs.  EBL < 10mL  Atlas Crossland PA-C 11/01/2021 4:35 PM

## 2021-11-01 NOTE — ED Notes (Signed)
BSC at bedside

## 2021-11-02 ENCOUNTER — Telehealth: Payer: Self-pay | Admitting: *Deleted

## 2021-11-02 DIAGNOSIS — C3491 Malignant neoplasm of unspecified part of right bronchus or lung: Secondary | ICD-10-CM | POA: Diagnosis not present

## 2021-11-02 DIAGNOSIS — I82491 Acute embolism and thrombosis of other specified deep vein of right lower extremity: Secondary | ICD-10-CM | POA: Diagnosis not present

## 2021-11-02 DIAGNOSIS — Z515 Encounter for palliative care: Secondary | ICD-10-CM

## 2021-11-02 DIAGNOSIS — J9601 Acute respiratory failure with hypoxia: Secondary | ICD-10-CM | POA: Diagnosis not present

## 2021-11-02 DIAGNOSIS — G893 Neoplasm related pain (acute) (chronic): Secondary | ICD-10-CM | POA: Diagnosis not present

## 2021-11-02 LAB — CBC
HCT: 28 % — ABNORMAL LOW (ref 39.0–52.0)
Hemoglobin: 9 g/dL — ABNORMAL LOW (ref 13.0–17.0)
MCH: 28.8 pg (ref 26.0–34.0)
MCHC: 32.1 g/dL (ref 30.0–36.0)
MCV: 89.5 fL (ref 80.0–100.0)
Platelets: 44 10*3/uL — ABNORMAL LOW (ref 150–400)
RBC: 3.13 MIL/uL — ABNORMAL LOW (ref 4.22–5.81)
RDW: 25.2 % — ABNORMAL HIGH (ref 11.5–15.5)
WBC: 30.6 10*3/uL — ABNORMAL HIGH (ref 4.0–10.5)
nRBC: 1 % — ABNORMAL HIGH (ref 0.0–0.2)

## 2021-11-02 LAB — BASIC METABOLIC PANEL
Anion gap: 9 (ref 5–15)
BUN: 17 mg/dL (ref 6–20)
CO2: 26 mmol/L (ref 22–32)
Calcium: 8 mg/dL — ABNORMAL LOW (ref 8.9–10.3)
Chloride: 101 mmol/L (ref 98–111)
Creatinine, Ser: 1.49 mg/dL — ABNORMAL HIGH (ref 0.61–1.24)
GFR, Estimated: 56 mL/min — ABNORMAL LOW (ref >60)
Glucose, Bld: 132 mg/dL — ABNORMAL HIGH (ref 70–99)
Potassium: 4.5 mmol/L (ref 3.5–5.1)
Sodium: 136 mmol/L (ref 135–145)

## 2021-11-02 LAB — GRAM STAIN

## 2021-11-02 MED ORDER — FAMOTIDINE 40 MG PO TABS: 40.0000 mg | ORAL_TABLET | Freq: Every evening | ORAL | 1 refills | Status: AC

## 2021-11-02 MED ORDER — MORPHINE SULFATE ER 15 MG PO TBCR
15.0000 mg | EXTENDED_RELEASE_TABLET | Freq: Every morning | ORAL | 0 refills | Status: DC
Start: 2021-11-03 — End: 2021-11-07

## 2021-11-02 MED ORDER — AMPHETAMINE-DEXTROAMPHET ER 30 MG PO CP24: 30.0000 mg | ORAL_CAPSULE | Freq: Every day | ORAL | 0 refills | Status: DC

## 2021-11-02 MED ORDER — MAGNESIUM OXIDE -MG SUPPLEMENT 400 (240 MG) MG PO TABS: 400.0000 mg | ORAL_TABLET | Freq: Every day | ORAL | 3 refills | Status: DC

## 2021-11-02 MED ORDER — MORPHINE SULFATE ER 30 MG PO TBCR: 30.0000 mg | EXTENDED_RELEASE_TABLET | Freq: Two times a day (BID) | ORAL | 0 refills | Status: DC

## 2021-11-02 MED ORDER — MAGNESIUM SULFATE 2 GM/50ML IV SOLN: 2.0000 g | Freq: Once | INTRAVENOUS | Status: DC

## 2021-11-02 MED ORDER — APIXABAN 5 MG PO TABS: 5.0000 mg | ORAL_TABLET | Freq: Two times a day (BID) | ORAL | 1 refills | Status: AC

## 2021-11-02 MED ORDER — MAGNESIUM OXIDE -MG SUPPLEMENT 400 (240 MG) MG PO TABS
400.0000 mg | ORAL_TABLET | Freq: Every day | ORAL | Status: DC
  Administered 2021-11-02: 400 mg via ORAL
  Filled 2021-11-02: qty 1

## 2021-11-02 MED ORDER — AMPHETAMINE-DEXTROAMPHET ER 10 MG PO CP24
30.0000 mg | ORAL_CAPSULE | Freq: Every day | ORAL | Status: DC
  Filled 2021-11-02: qty 3

## 2021-11-02 MED ORDER — DEXAMETHASONE 4 MG PO TABS
4.0000 mg | ORAL_TABLET | Freq: Every day | ORAL | Status: DC
  Administered 2021-11-02: 4 mg via ORAL
  Filled 2021-11-02: qty 1

## 2021-11-02 MED ORDER — ENSURE ENLIVE PO LIQD
237.0000 mL | Freq: Two times a day (BID) | ORAL | Status: DC
Start: 2021-11-02 — End: 2021-11-02

## 2021-11-02 MED ORDER — DEXAMETHASONE 4 MG PO TABS: 4.0000 mg | ORAL_TABLET | Freq: Every day | ORAL | 3 refills | Status: AC

## 2021-11-02 NOTE — Discharge Summary (Addendum)
Physician Discharge Summary  COLUM COLT TDV:[id number] DOB: 1971-03-12 DOA: 10/31/2021  PCP: [name], MD  Admit date: 10/31/2021 Discharge date: 11/02/2021  Admitted From: Home Disposition: Home  Recommendations for Outpatient Follow-up:  Patient will follow-up with palliative care next week to discuss further symptom management and goals of care Plans have repeat labs done next week to ensure that he is hemogram is stable Has been advised to restart Eliquis on 7/26 if he is no longer having any bleeding and overall platelet count is stable  Home Health: Equipment/Devices:  Discharge Condition: Stable CODE STATUS: DNR Diet recommendation: Regular diet  Brief/Interim Summary: 51 year old male with a history of non-small cell lung cancer on chemotherapy, previous history of DVT on anticoagulation, was admitted to the hospital 7/17 through 7/18 for rectal bleeding.  After returning home, he followed up at the cancer clinic for routine visit on 7/19 and was noted to be hypoxic on vitals check.  Sent to the ER for evaluation.  CT chest negative for pulmonary embolus.  It did show chronic right hydropneumothorax.  It was noted that he was developing significant ascites which may also be playing a part.  Discharge Diagnoses:  Principal Problem:   Acute respiratory failure with hypoxia (HCC) Active Problems:   Hydropneumothorax   Adenocarcinoma of right lung, stage 4 (HCC)   Acute deep vein thrombosis (DVT) of right lower extremity (HCC)   Thrombocytopenia (HCC)   Cancer associated pain   Palliative care patient  Acute respiratory failure with hypoxia, ruled in on admission -reported to have sats of 84% on RA at rest and down to 50% on RA with ambulation on admission. He also complained of shortness of breath, worse with exertion.  -CT chest was negative for pulmonary embolus -Chronic right hydropneumothorax appears to be unchanged from prior imaging -Seen by pulmonology and was not  felt that chest tube or other intervention on lung findings would be beneficial -He was noted to have ascites and underwent paracentesis with removal of over 3 L of fluid -After procedure, he feels significantly better, no longer short of breath or hypoxic.  He is breathing comfortably on room air   Chronic hydropneumothorax -Noted on CT imaging from 09/2021 -Does not appear to be clinically change since that -Seen by pulmonology and no further work-up/intervention recommended at this time   Stage IV adenocarcinoma of right lung -Followed by oncology, has been receiving chemo -Oncology notes from 7/19 indicate that he has a very aggressive, rapidly progressive cancer that does not appear to be responding to chemo -Recommendations per oncology were for hospice/comfort care -Patient/family are working on processing these decisions -Seen by palliative care and patient agreeable for DNR. 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 -Initially diagnosed 07/2021 -Images repeated 10/30/2021 that did show chronic thrombus that was mostly in the gastrocnemius muscles and appears to be improving from prior imaging -Considering his ongoing issues with GI bleeding, will hold anticoagulation until 7/26 -At that point, anticoagulation can be resumed if no longer bleeding and platelet count stable   Rectal bleeding -Recent admission for the same -We  will hold anticoagulation for now -If he has continued bleeding off of anticoagulation, can consider repeat GI evaluation to see if colonoscopy is  indicated -last dose of eliquis was AM 7/20 -Overall rectal bleeding appears to be improving -Likely precipitated by constipation/hemorrhoids -Reports improvement with MiraLAX   Thrombocytopenia -Suspect is related to chemotherapy -He did receive a unit of platelets on his last admission -Continue to monitor   Leukocytosis -Possibly related to recent Neulasta injection -Does not appear septic or toxic, he is afebrile -Continue to monitor   Ascites -Suspect this is secondary to peritoneal metastatic disease -Status post removal of 3.2 L of fluid -Fluid sent for cytology   Anxiety -Use benzodiazepines as needed  Discharge Instructions  Discharge Instructions     Diet - low sodium heart healthy   Complete by: As directed    Increase activity slowly   Complete by: As directed       Allergies as of 11/02/2021   No Known Allergies      Medication List     TAKE these medications    amphetamine-dextroamphetamine 30 MG 24 hr capsule Commonly known as: ADDERALL XR Take 1 capsule (30 mg total) by mouth daily.   apixaban 5 MG Tabs tablet Commonly known as: Eliquis Take 1 tablet (5 mg total) by mouth 2 (two) times daily. Restart 7/26 if no further bleeding What changed: additional instructions   cetirizine 10 MG tablet Commonly known as: ZYRTEC Take 10 mg by mouth in the morning.   dexamethasone 4 MG tablet Commonly known as: DECADRON Take 1 tablet (4 mg total) by mouth daily. What changed:  how much to take how to take this when to take this additional instructions   diazepam 2 MG tablet Commonly known as: VALIUM Take 1 tablet (2 mg total) by mouth every 12 (twelve) hours as needed for anxiety. What changed: how much to take   dronabinol 2.5 MG capsule Commonly known as: MARINOL Take 1 capsule (2.5 mg total) by mouth 2 (two) times daily before a meal.   famotidine 40 MG tablet Commonly known as: PEPCID Take 1 tablet (40 mg total) by mouth every  evening.   folic acid 1 MG tablet Commonly known as: FOLVITE Take 1 tablet (1 mg total) by mouth daily.   Integra Plus Caps Take 1 capsule by mouth daily.   lidocaine-prilocaine cream Commonly known as: EMLA Apply to the Port-A-Cath site 30 minutes before chemotherapy What changed:  how much to take how to take this when to take this   magnesium oxide 400 (240 Mg) MG tablet Commonly known as: MAG-OX Take 1 tablet (400 mg total) by mouth daily.   mirtazapine 30 MG tablet Commonly known as: REMERON TAKE 1 TABLET BY MOUTH AT BEDTIME   morphine 30 MG 12 hr tablet Commonly known as: MS CONTIN Take 1 tablet (30 mg total) by mouth every 12 (twelve) hours. What changed:  medication strength how much to take how to take this when to take this additional instructions   morphine 15 MG 12 hr tablet Commonly known as: MS CONTIN Take 1 tablet (15 mg total) by mouth every morning. Start taking on: November 03, 2021 What changed: You were already taking a medication with the same name, and this prescription was added. Make sure you understand how and when to take each.   oxyCODONE 5 MG immediate release tablet Commonly known as: Oxy IR/ROXICODONE Take 1 tablet (5 mg total) by mouth every 6 (six) hours as needed  for moderate pain or severe pain. What changed: reasons to take this   polyethylene glycol 17 g packet Commonly known as: MIRALAX / GLYCOLAX Take 17 g by mouth daily as needed for mild constipation.   prochlorperazine 10 MG tablet Commonly known as: COMPAZINE TAKE 1 TABLET BY MOUTH EVERY 6 HOURS AS NEEDED FOR NAUSEA FOR VOMITING What changed:  how much to take how to take this when to take this additional instructions        Follow-up Information     Pickenpack-Cousar, Carlena Sax, NP Follow up on 11/07/2021.   Specialty: Nurse Practitioner Contact information: [address] [place] [address] [phone]                No Known  Allergies  Consultations: Palliative care Pulmonology   Procedures/Studies: US Paracentesis  Result Date: 11/01/2021 INDICATION: Lung cancer, Ascites, SOB EXAM: ULTRASOUND GUIDED RLQ PARACENTESIS MEDICATIONS: None. COMPLICATIONS: None immediate. PROCEDURE: Informed written consent was obtained from the patient after a discussion of the risks, benefits and alternatives to treatment. A timeout was performed prior to the initiation of the procedure. Initial ultrasound scanning demonstrates a large amount of ascites within the right lower abdominal quadrant. The right lower abdomen was prepped and draped in the usual sterile fashion. 1% lidocaine was used for local anesthesia. Following this, a 19 gauge, 7-cm, Yueh catheter was introduced. An ultrasound image was saved for documentation purposes. The paracentesis was performed. The catheter was removed and a dressing was applied. The patient tolerated the procedure well without immediate post procedural complication. FINDINGS: A total of approximately 3.2L of ascitic fluid was removed. Samples were sent to the laboratory as requested by the clinical team. IMPRESSION: Successful ultrasound-guided paracentesis yielding 3.2 liters of peritoneal fluid. Electronically Signed   By: Jerilynn Mages.  Shick M.D.   On: 11/01/2021 16:38   CT Angio Chest PE W and/or Wo Contrast  Result Date: 10/31/2021 CLINICAL DATA:  Patient arriving from cancer center due to hypoxia. History of lung cancer. EXAM: CT ANGIOGRAPHY CHEST WITH CONTRAST TECHNIQUE: Multidetector CT imaging of the chest was performed using the standard protocol during bolus administration of intravenous contrast. Multiplanar CT image reconstructions and MIPs were obtained to evaluate the vascular anatomy. RADIATION DOSE REDUCTION: This exam was performed according to the departmental dose-optimization program which includes automated exposure control, adjustment of the mA and/or kV according to patient size and/or use  of iterative reconstruction technique. CONTRAST:  50mL OMNIPAQUE IOHEXOL 350 MG/ML SOLN COMPARISON:  CT examination dated September 27, 2021. FINDINGS: Cardiovascular: Satisfactory opacification of the pulmonary arteries to the segmental level. No evidence of pulmonary embolism. Normal heart size. No pericardial effusion. Mediastinum/Nodes: No enlarged mediastinal, hilar, or axillary lymph nodes. Thyroid gland, trachea, and esophagus demonstrate no significant findings. Lungs/Pleura: Large right lower lobe hydropneumothorax with pleural thickening is unchanged. Atelectasis and fibrotic changes of the right upper lobe, unchanged. Left lung is clear. No evidence of pneumonia or focal consolidation. Upper Abdomen: Moderate ascites in bilateral upper quadrants. Musculoskeletal: No chest wall abnormality. No acute or significant osseous findings. Review of the MIP images confirms the above findings. IMPRESSION: 1.  No evidence of pulmonary embolism. 2. Stable chronic findings of the right lower lobe volume loss, hydropneumothorax and prominent pleural thickening, unchanged. 3. Mild emphysematous changes. No acute pulmonary process in the residual right upper lobe as well as left lung. Aortic Atherosclerosis (ICD10-I70.0) and Emphysema (ICD10-J43.9). Electronically Signed   By: Keane Police D.O.   On: 10/31/2021  18:40   DG Chest 2 View  Result Date: 10/31/2021 CLINICAL DATA:  Short of breath, small cell lung cancer, malignant right pleural effusion EXAM: CHEST - 2 VIEW COMPARISON:  08/28/2021, 10/29/2021 FINDINGS: Frontal and lateral views of the chest demonstrates stable left chest wall port. Cardiac silhouette unchanged. Stable right-sided hydropneumothorax with gas fluid level again seen at the right lung base. Chronic right perihilar consolidation greatest in the right lower lobe. Left chest is clear. No acute bony abnormality. IMPRESSION: 1. Stable right hydropneumothorax. 2. Chronic right perihilar consolidation,  consistent with atelectasis and underlying malignancy. Electronically Signed   By: Randa Ngo M.D.   On: 10/31/2021 17:21   VAS [place] LOWER EXTREMITY VENOUS (DVT)  Result Date: 10/30/2021  Lower Venous DVT Study Patient Name:  [name] Potts  Date of Exam:   10/30/2021 Medical Rec #: [id number]       Accession #:    [id number] Date of Birth: 05/05/1970        Patient Gender: M Patient Age:   84 years Exam Location:  [facility] Procedure:      VAS [place] LOWER EXTREMITY VENOUS (DVT) Referring Phys: CHING TU --------------------------------------------------------------------------------  Indications: Follow up exam. Other Indications: Patient with recent DVT admitted with rectal bleeding. Assess                    if there is a potential need for IVC filter if unable to                    continue anticoagulation. Risk Factors: Chemotherapy and tobacco abuse. Anticoagulation: Eliquis - prior to admission. Comparison Study: Previous exam on 07/17/21 was positive for DVT (Popliteal,                   Gastroc, PTV, and PeroV) Performing Technologist: Jody Hill RVT, RDMS  Examination Guidelines: A complete evaluation includes B-mode imaging, spectral Doppler, color Doppler, and power Doppler as needed of all accessible portions of each vessel. Bilateral testing is considered an integral part of a complete examination. Limited examinations for reoccurring indications may be performed as noted. The reflux portion of the exam is performed with the patient in reverse Trendelenburg.  +---------+---------------+---------+-----------+----------+-----------------+ RIGHT    CompressibilityPhasicitySpontaneityPropertiesThrombus Aging    +---------+---------------+---------+-----------+----------+-----------------+ CFV      Full           Yes      Yes                                    +---------+---------------+---------+-----------+----------+-----------------+ SFJ      Full                                                            +---------+---------------+---------+-----------+----------+-----------------+ FV Prox  Full           Yes      Yes                                    +---------+---------------+---------+-----------+----------+-----------------+ FV Mid   Full           Yes      Yes                                    +---------+---------------+---------+-----------+----------+-----------------+  FV DistalFull           Yes      Yes                                    +---------+---------------+---------+-----------+----------+-----------------+ PFV      Full                                                           +---------+---------------+---------+-----------+----------+-----------------+ POP      Full           Yes      Yes                                    +---------+---------------+---------+-----------+----------+-----------------+ PTV      Full                                                           +---------+---------------+---------+-----------+----------+-----------------+ PERO     Full                                                           +---------+---------------+---------+-----------+----------+-----------------+ Gastroc  Partial        No       No                   Age Indeterminate +---------+---------------+---------+-----------+----------+-----------------+   +----+---------------+---------+-----------+----------+--------------+ LEFTCompressibilityPhasicitySpontaneityPropertiesThrombus Aging +----+---------------+---------+-----------+----------+--------------+ CFV Full           Yes      Yes                                 +----+---------------+---------+-----------+----------+--------------+     Summary: RIGHT: - Findings consistent with age indeterminate deep vein thrombosis involving the right gastrocnemius veins. - Findings appear improved from previous examination. - No cystic structure found in the  popliteal fossa.  LEFT: - No evidence of common femoral vein obstruction.  *See table(s) above for measurements and observations. Electronically signed by Deitra Mayo MD on 10/30/2021 at 1:04:42 PM.    Final    CT Abdomen Pelvis W Contrast  Result Date: 10/29/2021 CLINICAL DATA:  Epigastric pain with rectal bleeding EXAM: CT ABDOMEN AND PELVIS WITH CONTRAST TECHNIQUE: Multidetector CT imaging of the abdomen and pelvis was performed using the standard protocol following bolus administration of intravenous contrast. RADIATION DOSE REDUCTION: This exam was performed according to the departmental dose-optimization program which includes automated exposure control, adjustment of the mA and/or kV according to patient size and/or use of iterative reconstruction technique. CONTRAST:  133mL OMNIPAQUE IOHEXOL 300 MG/ML  SOLN COMPARISON:  CT 09/27/2021, PET CT 07/20/2021 FINDINGS: Lower chest: Pleural thickening/disease on the right redemonstrated with incompletely visualized hydropneumothorax. Interval small amount of left posterior pleural thickening or fluid.  Hepatobiliary: Subcentimeter hypodensities in the right hepatic lobe which are too small to further characterize. These are stable. No calcified gallstone. No biliary dilatation Pancreas: Unremarkable. No pancreatic ductal dilatation or surrounding inflammatory changes. Spleen: Several chronic splenic infarcts. Additional peripheral hypodensity lower pole of the spleen, coronal series 5, image 97, not clearly identified on the June exam and could represent additional small infarct. Adrenals/Urinary Tract: Adrenal glands are within normal limits. Kidneys show no hydronephrosis. The urinary bladder is unremarkable Stomach/Bowel: The stomach is nonenlarged. No dilated small bowel. No acute bowel wall thickening. Vascular/Lymphatic: Nonaneurysmal aorta.  No enlarging lymph nodes Reproductive: Prostate is unremarkable. Other: No free air. Increased ascites,  moderate volume. Increased infiltration and nodularity of the mesentery and omentum consistent with peritoneal metastatic disease. Musculoskeletal: No acute osseous abnormality IMPRESSION: 1. Incompletely visualized right hydropneumothorax with redemonstrated pleural disease. New small amount of left posterior pleural effusion or pleural disease. 2. Increased abdominopelvic ascites, now moderate volume. Increased soft tissue infiltration and nodularity of the mesentery and omentum consistent with progressive peritoneal metastatic disease. 3. Multiple splenic infarcts mostly chronic with age indeterminate small infarct more inferior Electronically Signed   By: Donavan Foil M.D.   On: 10/29/2021 17:31   CT HEAD WO CONTRAST (5MM)  Result Date: 10/10/2021 CLINICAL DATA:  Adenocarcinoma of the right lung. Visual disturbance. Question metastatic disease. EXAM: CT HEAD WITHOUT CONTRAST TECHNIQUE: Contiguous axial images were obtained from the base of the skull through the vertex without intravenous contrast. RADIATION DOSE REDUCTION: This exam was performed according to the departmental dose-optimization program which includes automated exposure control, adjustment of the mA and/or kV according to patient size and/or use of iterative reconstruction technique. COMPARISON:  08/31/2021 FINDINGS: Brain: Normal appearance without contrast. No evidence of old or acute infarction, mass lesion, hemorrhage, hydrocephalus or extra-axial collection. Sensitivity to small metastatic lesions is decreased by the absence of contrast. Vascular: No abnormal vascular finding. Skull: Negative Sinuses/Orbits: Clear/normal Other: None IMPRESSION: Normal noncontrast head CT. No sign of metastatic disease. Sensitivity to small lesions is reduced given the absence of contrast administration. Electronically Signed   By: Nelson Chimes M.D.   On: 10/10/2021 15:39      Subjective:  He is feeling better.  Shortness of breath is improved.   Says he is no longer having any rectal bleeding  Discharge Exam: Vitals:   11/01/21 1534 11/01/21 1547 11/01/21 2131 11/02/21 0131  BP: (!) 130/59 117/65 118/68 119/66  Pulse:  (!) 110 (!) 107 100  Resp:  18 16 16   Temp:  98 F (36.7 C) 98.4 F (36.9 C) 98 F (36.7 C)  TempSrc:  Oral Oral Oral  SpO2:  100% 100% 99%  Weight:      Height:        General: Pt is alert, awake, not in acute distress Cardiovascular: RRR, S1/S2 +, no rubs, no gallops Respiratory: CTA bilaterally, no wheezing, no rhonchi Abdominal: Soft, NT, ND, bowel sounds + Extremities: no edema, no cyanosis    The results of significant diagnostics from this hospitalization (including imaging, microbiology, ancillary and laboratory) are listed below for reference.     Microbiology: Recent Results (from the past 240 hour(s))  Resp Panel by RT-PCR (Flu A&B, Covid) Anterior Nasal Swab     Status: None   Collection Time: 10/31/21  5:19 PM   Specimen: Anterior Nasal Swab  Result Value Ref Range Status   SARS Coronavirus 2 by RT PCR NEGATIVE NEGATIVE Final    Comment: (NOTE) SARS-CoV-2  target nucleic acids are NOT DETECTED.  The SARS-CoV-2 RNA is generally detectable in upper respiratory specimens during the acute phase of infection. The lowest concentration of SARS-CoV-2 viral copies this assay can detect is 138 copies/mL. A negative result does not preclude SARS-Cov-2 infection and should not be used as the sole basis for treatment or other patient management decisions. A negative result may occur with  improper specimen collection/handling, submission of specimen other than nasopharyngeal swab, presence of viral mutation(s) within the areas targeted by this assay, and inadequate number of viral copies(<138 copies/mL). A negative result must be combined with clinical observations, patient history, and epidemiological information. The expected result is Negative.  Fact Sheet for Patients:   EntrepreneurPulse.com.au  Fact Sheet for Healthcare Providers:  IncredibleEmployment.be  This test is no t yet approved or cleared by the [place] FDA and  has been authorized for detection and/or diagnosis of SARS-CoV-2 by FDA under an Emergency Use Authorization (EUA). This EUA will remain  in effect (meaning this test can be used) for the duration of the COVID-19 declaration under Section 564(b)(1) of the Act, 21 U.S.C.section 360bbb-3(b)(1), unless the authorization is terminated  or revoked sooner.       Influenza A by PCR NEGATIVE NEGATIVE Final   Influenza B by PCR NEGATIVE NEGATIVE Final    Comment: (NOTE) The Xpert Xpress SARS-CoV-2/FLU/RSV plus assay is intended as an aid in the diagnosis of influenza from Nasopharyngeal swab specimens and should not be used as a sole basis for treatment. Nasal washings and aspirates are unacceptable for Xpert Xpress SARS-CoV-2/FLU/RSV testing.  Fact Sheet for Patients: EntrepreneurPulse.com.au  Fact Sheet for Healthcare Providers: IncredibleEmployment.be  This test is not yet approved or cleared by the [place] FDA and has been authorized for detection and/or diagnosis of SARS-CoV-2 by FDA under an Emergency Use Authorization (EUA). This EUA will remain in effect (meaning this test can be used) for the duration of the COVID-19 declaration under Section 564(b)(1) of the Act, 21 U.S.C. section 360bbb-3(b)(1), unless the authorization is terminated or revoked.  Performed at [facility], [place] [address] [place], [place] [address]   Culture, blood (routine x 2)     Status: None (Preliminary result)   Collection Time: 10/31/21  8:55 PM   Specimen: BLOOD RIGHT HAND  Result Value Ref Range Status   Specimen Description   Final    BLOOD RIGHT HAND Performed at [place] [address] [place], [place]  [address]    Special Requests   Final    BOTTLES DRAWN AEROBIC AND ANAEROBIC Blood Culture adequate volume Performed at [place] [address] [place], [place] [address]    Culture   Final    NO GROWTH 2 DAYS Performed at [place] [address], [place], [place] [address]    Report Status PENDING  Incomplete  Culture, blood (routine x 2)     Status: None (Preliminary result)   Collection Time: 10/31/21  8:58 PM   Specimen: BLOOD  Result Value Ref Range Status   Specimen Description   Final    BLOOD RIGHT ANTECUBITAL Performed at [place] [address] [place], [place] [address]    Special Requests   Final    BOTTLES DRAWN AEROBIC AND ANAEROBIC Blood Culture results may not be optimal due to an excessive volume of blood received in culture bottles Performed at [place] [address] [place],  [place] [address]    Culture   Final    NO GROWTH 2 DAYS Performed at [facility], [place] [address], [place], [place] [address]    Report Status PENDING  Incomplete  Culture, body fluid w Gram Stain-bottle     Status: None (Preliminary result)   Collection Time: 11/01/21  3:18 PM   Specimen: Peritoneal Washings  Result Value Ref Range Status   Specimen Description PERITONEAL  Final   Special Requests NONE  Final   Culture   Final    NO GROWTH < 12 HOURS Performed at [facility], [place] [address], [place], [place] [address]    Report Status PENDING  Incomplete  Gram stain     Status: None   Collection Time: 11/01/21  3:18 PM   Specimen: Peritoneal Washings  Result Value Ref Range Status   Specimen Description PERITONEAL  Final   Special Requests NONE  Final   Gram Stain   Final    CYTOSPIN SMEAR WBC PRESENT, PREDOMINANTLY MONONUCLEAR NO ORGANISMS SEEN Performed at [facility], [place] [address], [place], [place] [address]    Report Status 11/02/2021 FINAL  Final     Labs: BNP  (last 3 results) No results for input(s): "BNP" in the last 8760 hours. Basic Metabolic Panel: Recent Labs  Lab 10/30/21 0447 10/31/21 1446 10/31/21 1719 11/01/21 0513 11/02/21 0954  NA 134* 129* 134* 136 136  K 4.0 3.7 3.9 4.2 4.5  CL 101 94* 100 101 101  CO2 25 23 24 26 26   GLUCOSE 77 151* 121* 104* 132*  BUN 20 19 20 19 17   CREATININE 1.09 1.43* 1.31* 1.50* 1.49*  CALCIUM 8.2* 8.4* 8.6* 8.4* 8.0*  MG 1.5*  --   --   --   --    Liver Function Tests: Recent Labs  Lab 10/29/21 1530 10/30/21 0447 10/31/21 1446 10/31/21 1719  AST 49* 35 55* 55*  ALT 41 30 35 34  ALKPHOS 132* 101 178* 175*  BILITOT 0.9 0.7 0.7 0.8  PROT 6.6 5.5* 6.2* 6.1*  ALBUMIN 3.1* 2.7* 2.8* 2.8*   Recent Labs  Lab 10/29/21 1530  LIPASE 22   No results for input(s): "AMMONIA" in the last 168 hours. CBC: Recent Labs  Lab 10/30/21 0350 10/30/21 0447 10/31/21 1446 10/31/21 1719 11/01/21 0513 11/02/21 0954  WBC 4.9  --  26.3* 21.2* 28.4* 30.6*  NEUTROABS  --   --  18.0* 17.0*  --   --   HGB 8.7* 9.3* 9.8* 11.3* 9.2* 9.0*  HCT 26.4* 28.5* 29.3* 34.4* 28.6* 28.0*  MCV 87.7  --  84.7 86.4 88.0 89.5  PLT 50*  --  75* 57* 61* 44*   Cardiac Enzymes: Recent Labs  Lab 10/30/21 0447  CKTOTAL 29*   BNP: Invalid input(s): "POCBNP" CBG: No results for input(s): "GLUCAP" in the last 168 hours. D-Dimer No results for input(s): "DDIMER" in the last 72 hours. Hgb A1c No results for input(s): "HGBA1C" in the last 72 hours. Lipid Profile No results for input(s): "CHOL", "HDL", "LDLCALC", "TRIG", "CHOLHDL", "LDLDIRECT" in the last 72 hours. Thyroid function studies No results for input(s): "TSH", "T4TOTAL", "T3FREE", "THYROIDAB" in the last 72 hours.  Invalid input(s): "FREET3" Anemia work up No results for input(s): "VITAMINB12", "FOLATE", "FERRITIN", "TIBC", "IRON", "RETICCTPCT" in the last 72 hours. Urinalysis    Component Value Date/Time   COLORURINE YELLOW 08/05/2021 1655    APPEARANCEUR CLOUDY (A) 08/05/2021 1655   LABSPEC 1.030 08/05/2021  [place] 6.0 08/05/2021 1655   GLUCOSEU NEGATIVE 08/05/2021 1655   HGBUR SMALL (A) 08/05/2021 1655   BILIRUBINUR NEGATIVE 08/05/2021 1655   KETONESUR NEGATIVE 08/05/2021 1655   PROTEINUR <30 (A) 10/24/2021 1029   NITRITE NEGATIVE 08/05/2021 1655   LEUKOCYTESUR NEGATIVE 08/05/2021 1655   Sepsis Labs Recent Labs  Lab 10/31/21 1446 10/31/21 1719 11/01/21 0513 11/02/21 0954  WBC 26.3* 21.2* 28.4* 30.6*   Microbiology Recent Results (from the past 240 hour(s))  Resp Panel by RT-PCR (Flu A&B, Covid) Anterior Nasal Swab     Status: None   Collection Time: 10/31/21  5:19 PM   Specimen: Anterior Nasal Swab  Result Value Ref Range Status   SARS Coronavirus 2 by RT PCR NEGATIVE NEGATIVE Final    Comment: (NOTE) SARS-CoV-2 target nucleic acids are NOT DETECTED.  The SARS-CoV-2 RNA is generally detectable in upper respiratory specimens during the acute phase of infection. The lowest concentration of SARS-CoV-2 viral copies this assay can detect is 138 copies/mL. A negative result does not preclude SARS-Cov-2 infection and should not be used as the sole basis for treatment or other patient management decisions. A negative result may occur with  improper specimen collection/handling, submission of specimen other than nasopharyngeal swab, presence of viral mutation(s) within the areas targeted by this assay, and inadequate number of viral copies(<138 copies/mL). A negative result must be combined with clinical observations, patient history, and epidemiological information. The expected result is Negative.  Fact Sheet for Patients:  EntrepreneurPulse.com.au  Fact Sheet for Healthcare Providers:  IncredibleEmployment.be  This test is no t yet approved or cleared by the [place] FDA and  has been authorized for detection and/or diagnosis of SARS-CoV-2 by FDA under an  Emergency Use Authorization (EUA). This EUA will remain  in effect (meaning this test can be used) for the duration of the COVID-19 declaration under Section 564(b)(1) of the Act, 21 U.S.C.section 360bbb-3(b)(1), unless the authorization is terminated  or revoked sooner.       Influenza A by PCR NEGATIVE NEGATIVE Final   Influenza B by PCR NEGATIVE NEGATIVE Final    Comment: (NOTE) The Xpert Xpress SARS-CoV-2/FLU/RSV plus assay is intended as an aid in the diagnosis of influenza from Nasopharyngeal swab specimens and should not be used as a sole basis for treatment. Nasal washings and aspirates are unacceptable for Xpert Xpress SARS-CoV-2/FLU/RSV testing.  Fact Sheet for Patients: EntrepreneurPulse.com.au  Fact Sheet for Healthcare Providers: IncredibleEmployment.be  This test is not yet approved or cleared by the [place] FDA and has been authorized for detection and/or diagnosis of SARS-CoV-2 by FDA under an Emergency Use Authorization (EUA). This EUA will remain in effect (meaning this test can be used) for the duration of the COVID-19 declaration under Section 564(b)(1) of the Act, 21 U.S.C. section 360bbb-3(b)(1), unless the authorization is terminated or revoked.  Performed at [facility], [place] [address] [place], [place] [address]   Culture, blood (routine x 2)     Status: None (Preliminary result)   Collection Time: 10/31/21  8:55 PM   Specimen: BLOOD RIGHT HAND  Result Value Ref Range Status   Specimen Description   Final    BLOOD RIGHT HAND Performed at [place] [address] [place], [place] [address]    Special Requests   Final    BOTTLES DRAWN AEROBIC AND ANAEROBIC Blood Culture adequate volume Performed at [place] [address], [place], [place] [address]  Culture   Final    NO GROWTH 2 DAYS Performed at [facility], [place] [address] [place], [place] [address]    Report Status PENDING  Incomplete  Culture, blood (routine x 2)     Status: None (Preliminary result)   Collection Time: 10/31/21  8:58 PM   Specimen: BLOOD  Result Value Ref Range Status   Specimen Description   Final    BLOOD RIGHT ANTECUBITAL Performed at [place] [address], [place], [place] [address]    Special Requests   Final    BOTTLES DRAWN AEROBIC AND ANAEROBIC Blood Culture results may not be optimal due to an excessive volume of blood received in culture bottles Performed at [facility] [address], [place], [place] [address]    Culture   Final    NO GROWTH 2 DAYS Performed at [place] [address], [place], [place] [address]    Report Status PENDING  Incomplete  Culture, body fluid w Gram Stain-bottle     Status: None (Preliminary result)   Collection Time: 11/01/21  3:18 PM   Specimen: Peritoneal Washings  Result Value Ref Range Status   Specimen Description PERITONEAL  Final   Special Requests NONE  Final   Culture   Final    NO GROWTH < 12 HOURS Performed at [facility], [place] [address] [place], [place] [address]    Report Status PENDING  Incomplete  Gram stain     Status: None   Collection Time: 11/01/21  3:18 PM   Specimen: Peritoneal Washings  Result Value Ref Range Status   Specimen Description PERITONEAL  Final   Special Requests NONE  Final   Gram Stain   Final    CYTOSPIN SMEAR WBC PRESENT, PREDOMINANTLY MONONUCLEAR NO ORGANISMS SEEN Performed at [facility], [place] [address] [place],  [address]    Report Status 11/02/2021 FINAL  Final     Time coordinating discharge: 62mins  SIGNED:   Kathie Dike, MD  [facility] 11/02/2021, 2:09 PM   If 7PM-7AM, please contact night-coverage [email or web address]

## 2021-11-02 NOTE — Plan of Care (Signed)

## 2021-11-02 NOTE — Telephone Encounter (Signed)
Pt wife called in regards to pt upcoming appts and what to do after discharge from hospital. Advised pt wife that if pt is not discharged by next weeks upcoming appt with palliative to call and make office aware so that pt can be rescheduled. Pt wife verbalized understanding.

## 2021-11-02 NOTE — Progress Notes (Signed)
Patient discharged home with wife. PIV removed, port was previously accidentally removed by patient. Tele discontinued. Discharge teaching completed and all questions answered.

## 2021-11-02 NOTE — Consult Note (Addendum)
Palliative Care Consultation Note  51 yo with widely metastatic lung cancer, s/p 3L paracentesis with significant relief. Palliative care asked to consult for symptoms and for goals of care discussion, possible hospice options and terminal care planning.  I met with patient and his wife, along with Maygan RN from [facility]iative clinic.  Assessment: [name] Potts looks much improved today- pain is controlled he is sating normally without O2, no further bleeding or dyspnea.   Goals of Care: To live as well as possible for as long as possible with a high QOL and spend his time with family and friends, get important life things done while he still can. He is at this time not interested in doing chemotherapy, wants to avoid hospital and have support with symptoms and will be followed by our clinic closely. At this time does not feel he needs hospice but he is open to this support in the event of decline.  Recommendations:  DNR,Out of Hospital Form Start Adderall XR $RemoveBef'30mg'pLYLzzvaYR$  aQM for cancer related fatigue Maintain MS Contin dose-can adjust outpatient if needed Start Decadron 2-4 mg daily -use lowest effective dose for appetite and energy Started Pepcid for GI protection while on steroids and other symptom management medications. Will assess for need for additional paracentesis and refer to IR outpatient for management prn, maximize nutritional status and protein. Manage constipation aggressively-suspect rectal bleeding from fissure or hemorrhoids due to severe constipation   Daily Vitamin D and Magnesium Supplements may be beneficial, replete Mg Mirtazapine and marinol for appetite  Resume anticoagulation if no additional GIB signs.Cancer and steroid can increase risk for VTE.   Lane Hacker, DO Palliative Medicine  Time: 75 min

## 2021-11-02 NOTE — Progress Notes (Signed)
Initial Nutrition Assessment  INTERVENTION:   -Ensure Plus High Protein po BID, each supplement provides 350 kcal and 20 grams of protein.   NUTRITION DIAGNOSIS:   Increased nutrient needs related to cancer and cancer related treatments as evidenced by estimated needs.  GOAL:   Patient will meet greater than or equal to 90% of their needs  MONITOR:   PO intake, Supplement acceptance, Labs, Weight trends, I & O's  REASON FOR ASSESSMENT:   Malnutrition Screening Tool    ASSESSMENT:   51 year old male with a history of non-small cell lung cancer on chemotherapy, previous history of DVT on anticoagulation, was admitted to the hospital 7/17 through 7/18 for rectal bleeding.  After returning home, he followed up at the cancer clinic for routine visit on 7/19 and was noted to be hypoxic on vitals check.  7/20: s/p paracentesis, 3.2L yield  Patient in room, wife at bedside. Pt reports he has been eating less than normal. Main side effect from chemotherapy has been "brain fog". Last chemo 7/14. Per palliative care note, pt has opted to not continue chemo.   Pt states he drinks smoothies with protein powder that his wife makes. He also has Ensure and [employer] packets to use as well.  States he was started on steroids and this has helped his appetite.  For breakfast this morning he ate pancakes. He is agreeable to receiving Ensure supplements for additional kcals and protein.   Pt reports UBW is ~200 lbs. Weighed 197 lbs in December 2021. Per weight records, pt has lost 5 lbs since 5/10, insignificant for time frame.  Medications: Marinol, MAG-OX, Remeron, IV Mg sulfate  Labs reviewed.  NUTRITION - FOCUSED PHYSICAL EXAM:  Flowsheet Row Most Recent Value  Orbital Region No depletion  Upper Arm Region No depletion  Thoracic and Lumbar Region No depletion  Buccal Region Mild depletion  Temple Region Moderate depletion  Clavicle Bone Region No depletion   Clavicle and Acromion Bone Region No depletion  Scapular Bone Region Unable to assess  Dorsal Hand No depletion  Patellar Region Unable to assess  [wearing jeans and shoes]  Anterior Thigh Region Unable to assess  Posterior Calf Region Unable to assess  Edema (RD Assessment) None  Hair Reviewed  Eyes Reviewed  Mouth Reviewed  Skin Reviewed       Diet Order:   Diet Order             Diet regular Room service appropriate? Yes; Fluid consistency: Thin  Diet effective now                   EDUCATION NEEDS:   Education needs have been addressed  Skin:  Skin Assessment: Reviewed RN Assessment  Last BM:  7/20 -type 1  Height:   Ht Readings from Last 1 Encounters:  10/31/21 6' (1.829 m)    Weight:   Wt Readings from Last 1 Encounters:  10/31/21 77.8 kg   BMI:  Body mass index is 23.26 kg/m.  Estimated Nutritional Needs:   Kcal:  1900-2100  Protein:  100-115g  Fluid:  2.1L/day   Clayton Bibles, MS, RD, LDN Inpatient Clinical Dietitian Contact information available via Amion

## 2021-11-02 NOTE — Progress Notes (Signed)
24 hour chart audit completed 

## 2021-11-05 LAB — CULTURE, BLOOD (ROUTINE X 2)
Culture: NO GROWTH
Culture: NO GROWTH
Special Requests: ADEQUATE

## 2021-11-06 LAB — CULTURE, BODY FLUID W GRAM STAIN -BOTTLE: Culture: NO GROWTH

## 2021-11-06 LAB — CYTOLOGY - NON PAP

## 2021-11-07 ENCOUNTER — Inpatient Hospital Stay (HOSPITAL_BASED_OUTPATIENT_CLINIC_OR_DEPARTMENT_OTHER): Payer: Commercial Managed Care - PPO | Admitting: Nurse Practitioner

## 2021-11-07 ENCOUNTER — Other Ambulatory Visit: Payer: Self-pay | Admitting: Nurse Practitioner

## 2021-11-07 ENCOUNTER — Encounter: Payer: Self-pay | Admitting: Nurse Practitioner

## 2021-11-07 ENCOUNTER — Inpatient Hospital Stay: Payer: Commercial Managed Care - PPO

## 2021-11-07 ENCOUNTER — Other Ambulatory Visit: Payer: Self-pay

## 2021-11-07 VITALS — BP 130/77 | HR 98 | Temp 97.9°F | Wt 168.6 lb

## 2021-11-07 DIAGNOSIS — Z7189 Other specified counseling: Secondary | ICD-10-CM

## 2021-11-07 DIAGNOSIS — F909 Attention-deficit hyperactivity disorder, unspecified type: Secondary | ICD-10-CM | POA: Diagnosis not present

## 2021-11-07 DIAGNOSIS — R530 Neoplastic (malignant) related fatigue: Secondary | ICD-10-CM | POA: Diagnosis not present

## 2021-11-07 DIAGNOSIS — Z515 Encounter for palliative care: Secondary | ICD-10-CM | POA: Diagnosis not present

## 2021-11-07 DIAGNOSIS — Z95828 Presence of other vascular implants and grafts: Secondary | ICD-10-CM

## 2021-11-07 DIAGNOSIS — K5903 Drug induced constipation: Secondary | ICD-10-CM

## 2021-11-07 DIAGNOSIS — G893 Neoplasm related pain (acute) (chronic): Secondary | ICD-10-CM | POA: Diagnosis not present

## 2021-11-07 DIAGNOSIS — C3491 Malignant neoplasm of unspecified part of right bronchus or lung: Secondary | ICD-10-CM

## 2021-11-07 DIAGNOSIS — C3431 Malignant neoplasm of lower lobe, right bronchus or lung: Secondary | ICD-10-CM | POA: Diagnosis not present

## 2021-11-07 DIAGNOSIS — M549 Dorsalgia, unspecified: Secondary | ICD-10-CM | POA: Diagnosis not present

## 2021-11-07 DIAGNOSIS — K5900 Constipation, unspecified: Secondary | ICD-10-CM | POA: Diagnosis not present

## 2021-11-07 DIAGNOSIS — D735 Infarction of spleen: Secondary | ICD-10-CM | POA: Diagnosis not present

## 2021-11-07 DIAGNOSIS — Z5111 Encounter for antineoplastic chemotherapy: Secondary | ICD-10-CM | POA: Diagnosis not present

## 2021-11-07 DIAGNOSIS — Z87891 Personal history of nicotine dependence: Secondary | ICD-10-CM | POA: Diagnosis not present

## 2021-11-07 DIAGNOSIS — R630 Anorexia: Secondary | ICD-10-CM | POA: Diagnosis not present

## 2021-11-07 DIAGNOSIS — Z79899 Other long term (current) drug therapy: Secondary | ICD-10-CM | POA: Diagnosis not present

## 2021-11-07 DIAGNOSIS — R188 Other ascites: Secondary | ICD-10-CM | POA: Diagnosis not present

## 2021-11-07 DIAGNOSIS — Z7952 Long term (current) use of systemic steroids: Secondary | ICD-10-CM | POA: Diagnosis not present

## 2021-11-07 DIAGNOSIS — F419 Anxiety disorder, unspecified: Secondary | ICD-10-CM | POA: Diagnosis not present

## 2021-11-07 DIAGNOSIS — R140 Abdominal distension (gaseous): Secondary | ICD-10-CM | POA: Diagnosis not present

## 2021-11-07 DIAGNOSIS — R634 Abnormal weight loss: Secondary | ICD-10-CM

## 2021-11-07 DIAGNOSIS — Z86718 Personal history of other venous thrombosis and embolism: Secondary | ICD-10-CM | POA: Diagnosis not present

## 2021-11-07 DIAGNOSIS — Z7901 Long term (current) use of anticoagulants: Secondary | ICD-10-CM | POA: Diagnosis not present

## 2021-11-07 DIAGNOSIS — R21 Rash and other nonspecific skin eruption: Secondary | ICD-10-CM | POA: Diagnosis not present

## 2021-11-07 LAB — CBC WITH DIFFERENTIAL (CANCER CENTER ONLY)
Abs Immature Granulocytes: 2.57 10*3/uL — ABNORMAL HIGH (ref 0.00–0.07)
Basophils Absolute: 0.1 10*3/uL (ref 0.0–0.1)
Basophils Relative: 0 %
Eosinophils Absolute: 0 10*3/uL (ref 0.0–0.5)
Eosinophils Relative: 0 %
HCT: 28.2 % — ABNORMAL LOW (ref 39.0–52.0)
Hemoglobin: 9.3 g/dL — ABNORMAL LOW (ref 13.0–17.0)
Immature Granulocytes: 7 %
Lymphocytes Relative: 5 %
Lymphs Abs: 1.8 10*3/uL (ref 0.7–4.0)
MCH: 29.3 pg (ref 26.0–34.0)
MCHC: 33 g/dL (ref 30.0–36.0)
MCV: 89 fL (ref 80.0–100.0)
Monocytes Absolute: 1.2 10*3/uL — ABNORMAL HIGH (ref 0.1–1.0)
Monocytes Relative: 3 %
Neutro Abs: 33.2 10*3/uL — ABNORMAL HIGH (ref 1.7–7.7)
Neutrophils Relative %: 85 %
Platelet Count: 79 10*3/uL — ABNORMAL LOW (ref 150–400)
RBC: 3.17 MIL/uL — ABNORMAL LOW (ref 4.22–5.81)
RDW: 27 % — ABNORMAL HIGH (ref 11.5–15.5)
WBC Count: 38.8 10*3/uL — ABNORMAL HIGH (ref 4.0–10.5)
nRBC: 0.8 % — ABNORMAL HIGH (ref 0.0–0.2)

## 2021-11-07 LAB — CMP (CANCER CENTER ONLY)
ALT: 40 U/L (ref 0–44)
AST: 55 U/L — ABNORMAL HIGH (ref 15–41)
Albumin: 3 g/dL — ABNORMAL LOW (ref 3.5–5.0)
Alkaline Phosphatase: 201 U/L — ABNORMAL HIGH (ref 38–126)
Anion gap: 7 (ref 5–15)
BUN: 33 mg/dL — ABNORMAL HIGH (ref 6–20)
CO2: 27 mmol/L (ref 22–32)
Calcium: 8 mg/dL — ABNORMAL LOW (ref 8.9–10.3)
Chloride: 101 mmol/L (ref 98–111)
Creatinine: 1.47 mg/dL — ABNORMAL HIGH (ref 0.61–1.24)
GFR, Estimated: 57 mL/min — ABNORMAL LOW (ref >60)
Glucose, Bld: 109 mg/dL — ABNORMAL HIGH (ref 70–99)
Potassium: 3.9 mmol/L (ref 3.5–5.1)
Sodium: 135 mmol/L (ref 135–145)
Total Bilirubin: 0.5 mg/dL (ref 0.3–1.2)
Total Protein: 5.8 g/dL — ABNORMAL LOW (ref 6.5–8.1)

## 2021-11-07 MED ORDER — AMPHETAMINE-DEXTROAMPHET ER 15 MG PO CP24: 15.0000 mg | ORAL_CAPSULE | Freq: Every day | ORAL | 0 refills | Status: DC

## 2021-11-07 MED ORDER — HEPARIN SOD (PORK) LOCK FLUSH 100 UNIT/ML IV SOLN
500.0000 [IU] | Freq: Once | INTRAVENOUS | Status: AC
  Administered 2021-11-07: 500 [IU]

## 2021-11-07 MED ORDER — SODIUM CHLORIDE 0.9% FLUSH
10.0000 mL | Freq: Once | INTRAVENOUS | Status: AC
  Administered 2021-11-07: 10 mL

## 2021-11-07 NOTE — Progress Notes (Signed)
[place]  [phone] [phone]) [phone]   Name: [name] Potts Date: 11/07/2021 MRN: [id number]  DOB: 1970/05/11  Patient Care Team: [name], MD as PCP - General (Internal Medicine) Gala Romney, Cristopher Estimable, MD (Gastroenterology) Pickenpack-Cousar, Carlena Sax, NP as Nurse Practitioner (Nurse Practitioner)    INTERVAL HISTORY: [name] Potts is a 51 y.o. male with medical history including non-small cell lung cancer s/p carbo/Alimta/Keytruda now on second line systemic therapy including docetaxel and Cyramza (first cycle on 7/7), chronic back pain, and ADHD.  Palliative ask to see for symptom management and goals of care.   SOCIAL HISTORY:     reports that he quit smoking about 7 months ago. His smoking use included cigarettes. He smoked an average of .5 packs per day. His smokeless tobacco use includes snuff. He reports that he does not currently use alcohol. He reports that he does not currently use drugs after having used the following drugs: Marijuana.  ADVANCE DIRECTIVES:    CODE STATUS: DNR  PAST MEDICAL HISTORY: Past Medical History:  Diagnosis Date   ADHD (attention deficit hyperactivity disorder)    Chronic back pain    Complication of anesthesia    woke up during 1 of 3 colonoscopies   Constipation    Dyspnea    Pneumonia    Seasonal allergies     ALLERGIES:  has No Known Allergies.  MEDICATIONS:  Current Outpatient Medications  Medication Sig Dispense Refill   amphetamine-dextroamphetamine (ADDERALL XR) 15 MG 24 hr capsule Take 1 capsule by mouth daily. 30 capsule 0   apixaban (ELIQUIS) 5 MG TABS tablet Take 1 tablet (5 mg total) by mouth 2 (two) times daily. Restart 7/26 if no further bleeding 60 tablet 1   cetirizine (ZYRTEC) 10 MG tablet Take 10 mg by mouth in the morning.     dexamethasone (DECADRON) 4 MG tablet Take 1 tablet (4 mg total) by mouth daily. 30 tablet 3   diazepam (VALIUM) 2 MG tablet Take 1 tablet  (2 mg total) by mouth every 12 (twelve) hours as needed for anxiety. (Patient taking differently: Take 2-4 mg by mouth every 12 (twelve) hours as needed for anxiety.) 60 tablet 0   dronabinol (MARINOL) 2.5 MG capsule Take 1 capsule (2.5 mg total) by mouth 2 (two) times daily before a meal. 60 capsule 0   famotidine (PEPCID) 40 MG tablet Take 1 tablet (40 mg total) by mouth every evening. 30 tablet 1   FeFum-FePoly-FA-B Cmp-C-Biot (INTEGRA PLUS) CAPS Take 1 capsule by mouth daily. 30 capsule 3   folic acid (FOLVITE) 1 MG tablet Take 1 tablet (1 mg total) by mouth daily. 30 tablet 4   lidocaine-prilocaine (EMLA) cream Apply to the Port-A-Cath site 30 minutes before chemotherapy (Patient taking differently: Apply 1 Application topically See admin instructions. Apply to the Port-A-Cath site 30 minutes before chemotherapy) 30 g 0   magnesium oxide (MAG-OX) 400 (240 Mg) MG tablet Take 1 tablet (400 mg total) by mouth daily. 1 tablet 3   mirtazapine (REMERON) 30 MG tablet TAKE 1 TABLET BY MOUTH AT BEDTIME 30 tablet 0   morphine (MS CONTIN) 15 MG 12 hr tablet Take 1 tablet (15 mg total) by mouth every morning. 30 tablet 0   morphine (MS CONTIN) 30 MG 12 hr tablet Take 1 tablet (30 mg total) by mouth every 12 (twelve) hours. 60 tablet 0   oxyCODONE (OXY IR/ROXICODONE) 5 MG immediate release tablet Take 1 tablet (5  mg total) by mouth every 6 (six) hours as needed for moderate pain or severe pain. (Patient taking differently: Take 5 mg by mouth every 6 (six) hours as needed (for breakthrough pain).) 60 tablet 0   polyethylene glycol (MIRALAX / GLYCOLAX) 17 g packet Take 17 g by mouth daily as needed for mild constipation. 14 each 0   prochlorperazine (COMPAZINE) 10 MG tablet TAKE 1 TABLET BY MOUTH EVERY 6 HOURS AS NEEDED FOR NAUSEA FOR VOMITING (Patient taking differently: Take 10 mg by mouth every 6 (six) hours.) 30 tablet 2   No current facility-administered medications for this visit.    VITAL SIGNS: BP  130/77 (Patient Position: Sitting)   Pulse 98   Temp 97.9 F (36.6 C) (Oral)   Wt 168 lb 9.6 oz (76.5 kg)   SpO2 100%   BMI 22.87 kg/m  Filed Weights   11/07/21 1150  Weight: 168 lb 9.6 oz (76.5 kg)    Estimated body mass index is 22.87 kg/m as calculated from the following:   Height as of 10/31/21: 6' (1.829 m).   Weight as of this encounter: 168 lb 9.6 oz (76.5 kg).   PERFORMANCE STATUS (ECOG) : 1 - Symptomatic but completely ambulatory   Physical Exam General: NAD, weak  Cardiovascular: RRR Pulmonary: diminished, normal breathing pattern Abdomen: firm, mildly distended, nontender, + bowel sounds Extremities: no edema, no joint deformities Skin: no rashes Neurological: Weakness but otherwise nonfocal  IMPRESSION: [name] Potts and his wife presents to clinic today for symptom management follow-up s/p recent hospitalization. No acute distress noted. States he has been doing better since being home. Denies shortness of breath, nausea or vomiting. Ongoing pain and fatigue. Appetite remains minimal.   He is taking apricot seeds after researching and finding they are beneficial in cancer patients. He was discharged on Adderall 30 mg XR however feels this may be a little to strong per wife. We will decrease dose to 15mg .   Neoplasm related pain Pain is well controlled. Much improved. We discussed at length current regimen. He is tolerating MS Contin 30 mg with Oxy IR for breakthrough pain. Does not have to take around the clock.   We will continue to monitor and adjust as needed.   Constipation Controlled with Miralax and senna. Education provided on continued regimen in the setting of opioid use.   Decreased appetite [name] Potts states appetite continues to be minimal. He is taking dexamethasone and marinol as prescribed. Unable to notice a significant difference. His taste is altered since starting treatment which causes foods to be less appealing.   We discussed eating small  frequent meals instead of 3 large meals. Also ways to increase protein intake. Also discussed sweet and sour food options to assist with taste and allowing him to enjoy some items.   Encouraged to continue taking current medications. We will increase his marinol to 5mg   twice daily and closely monitor for increase in appetite.   Current weight is 168lbs. This is down from 171lbs on 7/19, and 176lbs on 5/10.   Goals of Care   We discussed Her current illness and what it means in the larger context of Her on-going co-morbidities. Natural disease trajectory and expectations were discussed.  Dmauri shares that he and his wife are not ready to "give up" and wish to continue his cancer fight. He is unsure he would want to continue current chemo regimen with awareness it is not improving his quality of life or cancer. He is inquiring about  clinical trials or possible evaluation for additional options. Encouraged patient to discuss further with Dr. Julien Nordmann at upcoming visit to gain better understanding of options and next steps.   [name] Potts is clear in expressed wishes to continue to treat allowing him every opportunity to thrive. His quality of life is important to him and is appreciative for ongoing symptom management. He is not open to comfort focused care until he knows all options and efforts have been exhausted. Emotional support provided.   I discussed the importance of continued conversation with family and their medical providers regarding overall plan of care and treatment options, ensuring decisions are within the context of the patients values and GOCs.  PLAN: MS Contin 30 mg twice daily Oxy IR 5-10mg  as needed for breakthrough pain Adderall 15 mg (decreased from 30mg ) Miralax twice daily for bowel regimen Valium 2mg  as needed Continue dexamethasone 4mg  daily Ongoing goals of care discussions I will plan to see patient back in 2-3 weeks. He knows to contact office sooner if needed.     Patient expressed understanding and was in agreement with this plan. He also understands that He can call the clinic at any time with any questions, concerns, or complaints.         Any controlled substances utilized were prescribed in the context of palliative care. PDMP has been reviewed.   Time Total: 45 min   Visit consisted of counseling and education dealing with the complex and emotionally intense issues of symptom management and palliative care in the setting of serious and potentially life-threatening illness.Greater than 50%  of this time was spent counseling and coordinating care related to the above assessment and plan.  Alda Lea, AGPCNP-BC  Palliative Medicine Team/[facility]

## 2021-11-08 ENCOUNTER — Encounter: Payer: Self-pay | Admitting: Internal Medicine

## 2021-11-12 ENCOUNTER — Telehealth: Payer: Self-pay

## 2021-11-12 ENCOUNTER — Other Ambulatory Visit: Payer: Self-pay | Admitting: Nurse Practitioner

## 2021-11-12 ENCOUNTER — Other Ambulatory Visit: Payer: Self-pay

## 2021-11-12 DIAGNOSIS — C3491 Malignant neoplasm of unspecified part of right bronchus or lung: Secondary | ICD-10-CM

## 2021-11-12 MED ORDER — MAGNESIUM OXIDE -MG SUPPLEMENT 400 (240 MG) MG PO TABS: 400.0000 mg | ORAL_TABLET | Freq: Every day | ORAL | 3 refills | Status: AC

## 2021-11-12 NOTE — Progress Notes (Signed)
Order for paracentesis placed per [facility]

## 2021-11-12 NOTE — Telephone Encounter (Signed)
Pr called to report increased abd swelling with no resp. Changes. Per Lexine Baton, NP order placed for paracentesis, will call with scheduling once made aware.

## 2021-11-13 ENCOUNTER — Ambulatory Visit (HOSPITAL_COMMUNITY)
Admission: RE | Admit: 2021-11-13 | Discharge: 2021-11-13 | Disposition: A | Discharge status: Home / Self Care | Payer: Commercial Managed Care - PPO | Source: Ambulatory Visit | Attending: Nurse Practitioner | Admitting: Nurse Practitioner

## 2021-11-13 ENCOUNTER — Telehealth: Payer: Self-pay | Admitting: Internal Medicine

## 2021-11-13 DIAGNOSIS — R180 Malignant ascites: Secondary | ICD-10-CM | POA: Diagnosis present

## 2021-11-13 DIAGNOSIS — C3491 Malignant neoplasm of unspecified part of right bronchus or lung: Secondary | ICD-10-CM | POA: Insufficient documentation

## 2021-11-13 MED ORDER — LIDOCAINE HCL 1 % IJ SOLN
INTRAMUSCULAR | Status: AC
  Administered 2021-11-13: 15 mL
  Filled 2021-11-13: qty 20

## 2021-11-13 MED FILL — Dexamethasone Sodium Phosphate Inj 100 MG/10ML: INTRAMUSCULAR | Qty: 1 | Status: AC

## 2021-11-13 NOTE — Telephone Encounter (Signed)
Called patient regarding all updated upcoming appointments, patient is notified.

## 2021-11-13 NOTE — Procedures (Signed)
Ultrasound-guided therapeutic paracentesis performed yielding 4.6 liters of yellow fluid. No immediate complications. EBL < 2 cc.

## 2021-11-14 ENCOUNTER — Inpatient Hospital Stay: Payer: Commercial Managed Care - PPO | Attending: Internal Medicine | Admitting: Internal Medicine

## 2021-11-14 ENCOUNTER — Ambulatory Visit: Payer: Commercial Managed Care - PPO

## 2021-11-14 ENCOUNTER — Ambulatory Visit: Payer: Commercial Managed Care - PPO | Admitting: Physician Assistant

## 2021-11-14 ENCOUNTER — Other Ambulatory Visit: Payer: Self-pay

## 2021-11-14 ENCOUNTER — Inpatient Hospital Stay: Payer: Commercial Managed Care - PPO

## 2021-11-14 ENCOUNTER — Other Ambulatory Visit: Payer: Commercial Managed Care - PPO

## 2021-11-14 VITALS — BP 127/65 | HR 101 | Temp 97.7°F | Wt 161.2 lb

## 2021-11-14 DIAGNOSIS — C782 Secondary malignant neoplasm of pleura: Secondary | ICD-10-CM | POA: Insufficient documentation

## 2021-11-14 DIAGNOSIS — K5900 Constipation, unspecified: Secondary | ICD-10-CM | POA: Diagnosis not present

## 2021-11-14 DIAGNOSIS — Z7901 Long term (current) use of anticoagulants: Secondary | ICD-10-CM | POA: Diagnosis not present

## 2021-11-14 DIAGNOSIS — Z8701 Personal history of pneumonia (recurrent): Secondary | ICD-10-CM | POA: Insufficient documentation

## 2021-11-14 DIAGNOSIS — J439 Emphysema, unspecified: Secondary | ICD-10-CM | POA: Insufficient documentation

## 2021-11-14 DIAGNOSIS — Z79891 Long term (current) use of opiate analgesic: Secondary | ICD-10-CM | POA: Insufficient documentation

## 2021-11-14 DIAGNOSIS — C3431 Malignant neoplasm of lower lobe, right bronchus or lung: Secondary | ICD-10-CM | POA: Diagnosis present

## 2021-11-14 DIAGNOSIS — C3491 Malignant neoplasm of unspecified part of right bronchus or lung: Secondary | ICD-10-CM

## 2021-11-14 DIAGNOSIS — G8929 Other chronic pain: Secondary | ICD-10-CM | POA: Diagnosis not present

## 2021-11-14 DIAGNOSIS — Z95828 Presence of other vascular implants and grafts: Secondary | ICD-10-CM

## 2021-11-14 DIAGNOSIS — R188 Other ascites: Secondary | ICD-10-CM | POA: Insufficient documentation

## 2021-11-14 DIAGNOSIS — F909 Attention-deficit hyperactivity disorder, unspecified type: Secondary | ICD-10-CM | POA: Insufficient documentation

## 2021-11-14 DIAGNOSIS — Z5111 Encounter for antineoplastic chemotherapy: Secondary | ICD-10-CM | POA: Diagnosis not present

## 2021-11-14 DIAGNOSIS — J910 Malignant pleural effusion: Secondary | ICD-10-CM | POA: Diagnosis not present

## 2021-11-14 DIAGNOSIS — Z7952 Long term (current) use of systemic steroids: Secondary | ICD-10-CM | POA: Insufficient documentation

## 2021-11-14 DIAGNOSIS — D735 Infarction of spleen: Secondary | ICD-10-CM | POA: Insufficient documentation

## 2021-11-14 DIAGNOSIS — R590 Localized enlarged lymph nodes: Secondary | ICD-10-CM | POA: Diagnosis not present

## 2021-11-14 DIAGNOSIS — I700 Atherosclerosis of aorta: Secondary | ICD-10-CM | POA: Insufficient documentation

## 2021-11-14 DIAGNOSIS — J948 Other specified pleural conditions: Secondary | ICD-10-CM | POA: Insufficient documentation

## 2021-11-14 LAB — CMP (CANCER CENTER ONLY)
ALT: 27 U/L (ref 0–44)
AST: 38 U/L (ref 15–41)
Albumin: 2.8 g/dL — ABNORMAL LOW (ref 3.5–5.0)
Alkaline Phosphatase: 158 U/L — ABNORMAL HIGH (ref 38–126)
Anion gap: 5 (ref 5–15)
BUN: 27 mg/dL — ABNORMAL HIGH (ref 6–20)
CO2: 27 mmol/L (ref 22–32)
Calcium: 8.1 mg/dL — ABNORMAL LOW (ref 8.9–10.3)
Chloride: 101 mmol/L (ref 98–111)
Creatinine: 1.39 mg/dL — ABNORMAL HIGH (ref 0.61–1.24)
GFR, Estimated: >60 mL/min (ref >60)
Glucose, Bld: 185 mg/dL — ABNORMAL HIGH (ref 70–99)
Potassium: 4 mmol/L (ref 3.5–5.1)
Sodium: 133 mmol/L — ABNORMAL LOW (ref 135–145)
Total Bilirubin: 0.3 mg/dL (ref 0.3–1.2)
Total Protein: 5.5 g/dL — ABNORMAL LOW (ref 6.5–8.1)

## 2021-11-14 LAB — CBC WITH DIFFERENTIAL (CANCER CENTER ONLY)
Abs Immature Granulocytes: 0.16 10*3/uL — ABNORMAL HIGH (ref 0.00–0.07)
Basophils Absolute: 0 10*3/uL (ref 0.0–0.1)
Basophils Relative: 0 %
Eosinophils Absolute: 0 10*3/uL (ref 0.0–0.5)
Eosinophils Relative: 0 %
HCT: 29.6 % — ABNORMAL LOW (ref 39.0–52.0)
Hemoglobin: 9.7 g/dL — ABNORMAL LOW (ref 13.0–17.0)
Immature Granulocytes: 1 %
Lymphocytes Relative: 5 %
Lymphs Abs: 0.8 10*3/uL (ref 0.7–4.0)
MCH: 30.4 pg (ref 26.0–34.0)
MCHC: 32.8 g/dL (ref 30.0–36.0)
MCV: 92.8 fL (ref 80.0–100.0)
Monocytes Absolute: 0.7 10*3/uL (ref 0.1–1.0)
Monocytes Relative: 4 %
Neutro Abs: 16 10*3/uL — ABNORMAL HIGH (ref 1.7–7.7)
Neutrophils Relative %: 90 %
Platelet Count: 94 10*3/uL — ABNORMAL LOW (ref 150–400)
RBC: 3.19 MIL/uL — ABNORMAL LOW (ref 4.22–5.81)
RDW: 26.6 % — ABNORMAL HIGH (ref 11.5–15.5)
WBC Count: 17.6 10*3/uL — ABNORMAL HIGH (ref 4.0–10.5)
nRBC: 0 % (ref 0.0–0.2)

## 2021-11-14 MED ORDER — SODIUM CHLORIDE 0.9% FLUSH
10.0000 mL | Freq: Once | INTRAVENOUS | Status: AC
  Administered 2021-11-14: 10 mL

## 2021-11-14 MED ORDER — HEPARIN SOD (PORK) LOCK FLUSH 100 UNIT/ML IV SOLN
500.0000 [IU] | Freq: Once | INTRAVENOUS | Status: AC
  Administered 2021-11-14: 500 [IU]

## 2021-11-14 NOTE — Progress Notes (Signed)
[name] [phone]   [phone]  OFFICE PROGRESS NOTE  [name], MD [place] [address]  DIAGNOSIS: Stage IV (T1b, N2, M1 a) non-small cell lung cancer, adenocarcinoma presented with right lower lobe lung nodule in addition to right paratracheal and subcarinal lymphadenopathy and malignant right pleural effusion as well as pleural metastatic disease diagnosed in March 2023.  His molecular studies showed no actionable mutation and PD-L1 expression was 1%. It showed KRAS G12D, Myc amplification, CCND3 amplification, TP53 R248L, VEGFA amplification but these are not actionable at this time.   PRIOR THERAPY: carboplatin for AUC of 5, Alimta 500 Mg/M2 and Keytruda 200 Mg IV every 3 weeks. First dose on 08/01/21. Status post 2 cycles. Alimta dose reduced to 400 mg/m2 starting from cycle #3.    CURRENT THERAPY: Second line systemic chemotherapy with docetaxel 65 Mg/M2 and Cyramza 10 Mg/KG every 3 weeks with Neulasta support.  First dose 10/19/2021.  Status post 1 cycle.  This was discontinued secondary to intolerance.  INTERVAL HISTORY: [name] Potts 51 y.o. male returns to the clinic today for follow-up visit accompanied by his wife.  The patient is feeling a little bit better today.  He underwent another ultrasound-guided paracentesis yesterday with drainage of 4.6 L of peritoneal fluid.  He underwent a previous paracentesis on November 01, 2021 with drainage of 3.2 L of ascitic fluid and the cytology was positive for malignant cells.  The patient has no chest pain but baseline shortness of breath increased with exertion and no cough with no hemoptysis.  He lost several pounds since his last visit.  He denied having any fever or chills.  He has no current nausea, vomiting, diarrhea or constipation.  He received only 1 cycle of the second line chemotherapy with docetaxel and Cyramza but has intolerance to this treatment and also his disease is  progressing significantly recently.  He was evaluated for enrollment in the [facility] clinical trial with cobolimab and Dosartlimab +/- docetaxel but unfortunately he was not a candidate for this treatment.  He is considering second opinion at [facility] and he is scheduled to see Dr. Mindi Junker next week for other clinical trial options.  He is here today for evaluation and repeat blood work.  MEDICAL HISTORY: Past Medical History:  Diagnosis Date   ADHD (attention deficit hyperactivity disorder)    Chronic back pain    Complication of anesthesia    woke up during 1 of 3 colonoscopies   Constipation    Dyspnea    Pneumonia    Seasonal allergies     ALLERGIES:  has No Known Allergies.  MEDICATIONS:  Current Outpatient Medications  Medication Sig Dispense Refill   amphetamine-dextroamphetamine (ADDERALL XR) 15 MG 24 hr capsule Take 1 capsule by mouth daily. 30 capsule 0   apixaban (ELIQUIS) 5 MG TABS tablet Take 1 tablet (5 mg total) by mouth 2 (two) times daily. Restart 7/26 if no further bleeding 60 tablet 1   cetirizine (ZYRTEC) 10 MG tablet Take 10 mg by mouth in the morning.     dexamethasone (DECADRON) 4 MG tablet Take 1 tablet (4 mg total) by mouth daily. 30 tablet 3   diazepam (VALIUM) 2 MG tablet Take 1 tablet (2 mg total) by mouth every 12 (twelve) hours as needed for anxiety. (Patient taking differently: Take 2-4 mg by mouth every 12 (twelve) hours as needed for anxiety.) 60 tablet 0   dronabinol (MARINOL) 2.5  MG capsule Take 1 capsule (2.5 mg total) by mouth 2 (two) times daily before a meal. 60 capsule 0   famotidine (PEPCID) 40 MG tablet Take 1 tablet (40 mg total) by mouth every evening. 30 tablet 1   FeFum-FePoly-FA-B Cmp-C-Biot (INTEGRA PLUS) CAPS Take 1 capsule by mouth daily. 30 capsule 3   folic acid (FOLVITE) 1 MG tablet Take 1 tablet (1 mg total) by mouth daily. 30 tablet 4   lidocaine-prilocaine (EMLA) cream Apply to the Port-A-Cath site 30 minutes before  chemotherapy (Patient taking differently: Apply 1 Application topically See admin instructions. Apply to the Port-A-Cath site 30 minutes before chemotherapy) 30 g 0   magnesium oxide (MAG-OX) 400 (240 Mg) MG tablet Take 1 tablet (400 mg total) by mouth daily. 30 tablet 3   mirtazapine (REMERON) 30 MG tablet TAKE 1 TABLET BY MOUTH AT BEDTIME 30 tablet 0   morphine (MS CONTIN) 30 MG 12 hr tablet Take 1 tablet (30 mg total) by mouth every 12 (twelve) hours. 60 tablet 0   oxyCODONE (OXY IR/ROXICODONE) 5 MG immediate release tablet Take 1 tablet (5 mg total) by mouth every 6 (six) hours as needed for moderate pain or severe pain. (Patient taking differently: Take 5 mg by mouth every 6 (six) hours as needed (for breakthrough pain).) 60 tablet 0   polyethylene glycol (MIRALAX / GLYCOLAX) 17 g packet Take 17 g by mouth daily as needed for mild constipation. 14 each 0   prochlorperazine (COMPAZINE) 10 MG tablet TAKE 1 TABLET BY MOUTH EVERY 6 HOURS AS NEEDED FOR NAUSEA FOR VOMITING (Patient taking differently: Take 10 mg by mouth every 6 (six) hours.) 30 tablet 2   No current facility-administered medications for this visit.    SURGICAL HISTORY:  Past Surgical History:  Procedure Laterality Date   COLONOSCOPY  10/11/2011   Procedure: COLONOSCOPY;  Surgeon: Daneil Dolin, MD;  [facility];  Service: Endoscopy;  Laterality: N/A;  1:45 PM   COLONOSCOPY N/A 01/23/2018   Procedure: COLONOSCOPY;  Surgeon: Daneil Dolin, MD;  [facility];  Service: Endoscopy;  Laterality: N/A;  1:00   IR IMAGING GUIDED PORT INSERTION  07/27/2021   NECK SURGERY     Apr 05, 2021- per pt report cervical surgery at day surgery center   POLYPECTOMY  01/23/2018   Procedure: POLYPECTOMY;  Surgeon: Daneil Dolin, MD;  [facility]: [facility];  Service: Endoscopy;;   VIDEO ASSISTED THORACOSCOPY (VATS)/DECORTICATION Right 06/27/2021   Procedure: VIDEO ASSISTED THORACOSCOPY (VATS), Drainage of RIGHT pleural  effusion/ DECORTICATION;  Surgeon: Melrose Nakayama, MD;  Location: [facility];  Service: Thoracic;  Laterality: Right;    REVIEW OF SYSTEMS:  Constitutional: positive for anorexia, fatigue, and weight loss Eyes: negative Ears, nose, mouth, throat, and face: negative Respiratory: positive for dyspnea on exertion Cardiovascular: positive for tachypnea Gastrointestinal: Abdominal distention Genitourinary:negative Integument/breast: negative Hematologic/lymphatic: negative Musculoskeletal:positive for muscle weakness Neurological: negative Behavioral/Psych: negative Endocrine: negative Allergic/Immunologic: negative   PHYSICAL EXAMINATION: General appearance: alert, cooperative, fatigued, and no distress Head: Normocephalic, without obvious abnormality, atraumatic Neck: no adenopathy, no JVD, supple, symmetrical, trachea midline, and thyroid not enlarged, symmetric, no tenderness/mass/nodules Lymph nodes: Cervical, supraclavicular, and axillary nodes normal. Resp: clear to auscultation bilaterally Back: symmetric, no curvature. ROM normal. No CVA tenderness. Cardio: Tachycardic GI: Distended with mild tenderness Extremities: extremities normal, atraumatic, no cyanosis or edema Neurologic: Alert and oriented X 3, normal strength and tone. Normal symmetric reflexes. Normal coordination and gait  ECOG  PERFORMANCE STATUS: 1 - Symptomatic but completely ambulatory  Blood pressure 127/65, pulse (!) 101, temperature 97.7 F (36.5 C), temperature source Oral, weight 161 lb 4 oz (73.1 kg), SpO2 98 %.  LABORATORY DATA: Lab Results  Component Value Date   WBC 17.6 (H) 11/14/2021   HGB 9.7 (L) 11/14/2021   HCT 29.6 (L) 11/14/2021   MCV 92.8 11/14/2021   PLT 94 (L) 11/14/2021      Chemistry      Component Value Date/Time   NA 135 11/07/2021 1132   NA 140 11/22/2020 1644   K 3.9 11/07/2021 1132   CL 101 11/07/2021 1132   CO2 27 11/07/2021 1132   BUN 33 (H) 11/07/2021 1132   BUN 13  11/22/2020 1644   CREATININE 1.47 (H) 11/07/2021 1132      Component Value Date/Time   CALCIUM 8.0 (L) 11/07/2021 1132   ALKPHOS 201 (H) 11/07/2021 1132   AST 55 (H) 11/07/2021 1132   ALT 40 11/07/2021 1132   BILITOT 0.5 11/07/2021 1132       RADIOGRAPHIC STUDIES: US Paracentesis  Result Date: 11/13/2021 INDICATION: Patient with history of lung cancer with recurrent malignant ascites. Request received for therapeutic paracentesis. EXAM: ULTRASOUND GUIDED THERAPEUTIC PARACENTESIS MEDICATIONS: 10 mL 1% lidocaine COMPLICATIONS: None immediate. PROCEDURE: Informed written consent was obtained from the patient after a discussion of the risks, benefits and alternatives to treatment. A timeout was performed prior to the initiation of the procedure. Initial ultrasound scanning demonstrates a large amount of ascites within the right lower abdominal quadrant. The right lower abdomen was prepped and draped in the usual sterile fashion. 1% lidocaine was used for local anesthesia. Following this, a 19 gauge, 7-cm, Yueh catheter was introduced. An ultrasound image was saved for documentation purposes. The paracentesis was performed. The catheter was removed and a dressing was applied. The patient tolerated the procedure well without immediate post procedural complication. FINDINGS: A total of approximately 4.6 liters of yellow fluid was removed. IMPRESSION: Successful ultrasound-guided therapeutic paracentesis yielding 4.6 liters of peritoneal fluid. Read by: Rowe Robert, PA-C Electronically Signed   By: Jerilynn Mages.  Shick M.D.   On: 11/13/2021 16:23   US Paracentesis  Result Date: 11/01/2021 INDICATION: Lung cancer, Ascites, SOB EXAM: ULTRASOUND GUIDED RLQ PARACENTESIS MEDICATIONS: None. COMPLICATIONS: None immediate. PROCEDURE: Informed written consent was obtained from the patient after a discussion of the risks, benefits and alternatives to treatment. A timeout was performed prior to the initiation of the  procedure. Initial ultrasound scanning demonstrates a large amount of ascites within the right lower abdominal quadrant. The right lower abdomen was prepped and draped in the usual sterile fashion. 1% lidocaine was used for local anesthesia. Following this, a 19 gauge, 7-cm, Yueh catheter was introduced. An ultrasound image was saved for documentation purposes. The paracentesis was performed. The catheter was removed and a dressing was applied. The patient tolerated the procedure well without immediate post procedural complication. FINDINGS: A total of approximately 3.2L of ascitic fluid was removed. Samples were sent to the laboratory as requested by the clinical team. IMPRESSION: Successful ultrasound-guided paracentesis yielding 3.2 liters of peritoneal fluid. Electronically Signed   By: Jerilynn Mages.  Shick M.D.   On: 11/01/2021 16:38   CT Angio Chest PE W and/or Wo Contrast  Result Date: 10/31/2021 CLINICAL DATA:  Patient arriving from cancer center due to hypoxia. History of lung cancer. EXAM: CT ANGIOGRAPHY CHEST WITH CONTRAST TECHNIQUE: Multidetector CT imaging of the chest was performed using the standard protocol during bolus administration  of intravenous contrast. Multiplanar CT image reconstructions and MIPs were obtained to evaluate the vascular anatomy. RADIATION DOSE REDUCTION: This exam was performed according to the departmental dose-optimization program which includes automated exposure control, adjustment of the mA and/or kV according to patient size and/or use of iterative reconstruction technique. CONTRAST:  24m OMNIPAQUE IOHEXOL 350 MG/ML SOLN COMPARISON:  CT examination dated September 27, 2021. FINDINGS: Cardiovascular: Satisfactory opacification of the pulmonary arteries to the segmental level. No evidence of pulmonary embolism. Normal heart size. No pericardial effusion. Mediastinum/Nodes: No enlarged mediastinal, hilar, or axillary lymph nodes. Thyroid gland, trachea, and esophagus demonstrate no  significant findings. Lungs/Pleura: Large right lower lobe hydropneumothorax with pleural thickening is unchanged. Atelectasis and fibrotic changes of the right upper lobe, unchanged. Left lung is clear. No evidence of pneumonia or focal consolidation. Upper Abdomen: Moderate ascites in bilateral upper quadrants. Musculoskeletal: No chest wall abnormality. No acute or significant osseous findings. Review of the MIP images confirms the above findings. IMPRESSION: 1.  No evidence of pulmonary embolism. 2. Stable chronic findings of the right lower lobe volume loss, hydropneumothorax and prominent pleural thickening, unchanged. 3. Mild emphysematous changes. No acute pulmonary process in the residual right upper lobe as well as left lung. Aortic Atherosclerosis (ICD10-I70.0) and Emphysema (ICD10-J43.9). Electronically Signed   By: IKeane PoliceD.O.   On: 10/31/2021 18:40   DG Chest 2 View  Result Date: 10/31/2021 CLINICAL DATA:  Short of breath, small cell lung cancer, malignant right pleural effusion EXAM: CHEST - 2 VIEW COMPARISON:  08/28/2021, 10/29/2021 FINDINGS: Frontal and lateral views of the chest demonstrates stable left chest wall port. Cardiac silhouette unchanged. Stable right-sided hydropneumothorax with gas fluid level again seen at the right lung base. Chronic right perihilar consolidation greatest in the right lower lobe. Left chest is clear. No acute bony abnormality. IMPRESSION: 1. Stable right hydropneumothorax. 2. Chronic right perihilar consolidation, consistent with atelectasis and underlying malignancy. Electronically Signed   By: MRanda NgoM.D.   On: 10/31/2021 17:21   VAS [place]OWER EXTREMITY VENOUS (DVT)  Result Date: 10/30/2021  Lower Venous DVT Study Patient Name:  RJARRIN STALEYPRICE  Date of Exam:   10/30/2021 Medical Rec #: [id number]      Accession #:    [id number] of Birth: [birth year]       Patient Gender: M Patient Age:   89years [facility] Procedure:      VAS [place]OWER EXTREMITY VENOUS (DVT) Referring Phys: CHING TU --------------------------------------------------------------------------------  Indications: Follow up exam. Other Indications: Patient with recent DVT admitted with rectal bleeding. Assess                    if there is a potential need for IVC filter if unable to                    continue anticoagulation. Risk Factors: Chemotherapy and tobacco abuse. Anticoagulation: Eliquis - prior to admission. Comparison Study: Previous exam on 07/17/21 was positive for DVT (Popliteal,                   Gastroc, PTV, and PeroV) Performing Technologist: Jody Hill RVT, RDMS  Examination Guidelines: A complete evaluation includes B-mode imaging, spectral Doppler, color Doppler, and power Doppler as needed of all accessible portions of each vessel. Bilateral testing is considered an integral part of a complete examination. Limited examinations for reoccurring indications may be performed as noted. The  reflux portion of the exam is performed with the patient in reverse Trendelenburg.  +---------+---------------+---------+-----------+----------+-----------------+ RIGHT    CompressibilityPhasicitySpontaneityPropertiesThrombus Aging    +---------+---------------+---------+-----------+----------+-----------------+ CFV      Full           Yes      Yes                                    +---------+---------------+---------+-----------+----------+-----------------+ SFJ      Full                                                           +---------+---------------+---------+-----------+----------+-----------------+ FV Prox  Full           Yes      Yes                                    +---------+---------------+---------+-----------+----------+-----------------+ FV Mid   Full           Yes      Yes                                    +---------+---------------+---------+-----------+----------+-----------------+ FV DistalFull            Yes      Yes                                    +---------+---------------+---------+-----------+----------+-----------------+ PFV      Full                                                           +---------+---------------+---------+-----------+----------+-----------------+ POP      Full           Yes      Yes                                    +---------+---------------+---------+-----------+----------+-----------------+ PTV      Full                                                           +---------+---------------+---------+-----------+----------+-----------------+ PERO     Full                                                           +---------+---------------+---------+-----------+----------+-----------------+ Gastroc  Partial        No       No  Age Indeterminate +---------+---------------+---------+-----------+----------+-----------------+   +----+---------------+---------+-----------+----------+--------------+ LEFTCompressibilityPhasicitySpontaneityPropertiesThrombus Aging +----+---------------+---------+-----------+----------+--------------+ CFV Full           Yes      Yes                                 +----+---------------+---------+-----------+----------+--------------+     Summary: RIGHT: - Findings consistent with age indeterminate deep vein thrombosis involving the right gastrocnemius veins. - Findings appear improved from previous examination. - No cystic structure found in the popliteal fossa.  LEFT: - No evidence of common femoral vein obstruction.  *See table(s) above for measurements and observations. Electronically signed by Deitra Mayo MD on 10/30/2021 at 1:04:42 PM.    Final    CT Abdomen Pelvis W Contrast  Result Date: 10/29/2021 CLINICAL DATA:  Epigastric pain with rectal bleeding EXAM: CT ABDOMEN AND PELVIS WITH CONTRAST TECHNIQUE: Multidetector CT imaging of the abdomen and pelvis was performed  using the standard protocol following bolus administration of intravenous contrast. RADIATION DOSE REDUCTION: This exam was performed according to the departmental dose-optimization program which includes automated exposure control, adjustment of the mA and/or kV according to patient size and/or use of iterative reconstruction technique. CONTRAST:  110m OMNIPAQUE IOHEXOL 300 MG/ML  SOLN COMPARISON:  CT 09/27/2021, PET CT 07/20/2021 FINDINGS: Lower chest: Pleural thickening/disease on the right redemonstrated with incompletely visualized hydropneumothorax. Interval small amount of left posterior pleural thickening or fluid. Hepatobiliary: Subcentimeter hypodensities in the right hepatic lobe which are too small to further characterize. These are stable. No calcified gallstone. No biliary dilatation Pancreas: Unremarkable. No pancreatic ductal dilatation or surrounding inflammatory changes. Spleen: Several chronic splenic infarcts. Additional peripheral hypodensity lower pole of the spleen, coronal series 5, image 97, not clearly identified on the June exam and could represent additional small infarct. Adrenals/Urinary Tract: Adrenal glands are within normal limits. Kidneys show no hydronephrosis. The urinary bladder is unremarkable Stomach/Bowel: The stomach is nonenlarged. No dilated small bowel. No acute bowel wall thickening. Vascular/Lymphatic: Nonaneurysmal aorta.  No enlarging lymph nodes Reproductive: Prostate is unremarkable. Other: No free air. Increased ascites, moderate volume. Increased infiltration and nodularity of the mesentery and omentum consistent with peritoneal metastatic disease. Musculoskeletal: No acute osseous abnormality IMPRESSION: 1. Incompletely visualized right hydropneumothorax with redemonstrated pleural disease. New small amount of left posterior pleural effusion or pleural disease. 2. Increased abdominopelvic ascites, now moderate volume. Increased soft tissue infiltration and  nodularity of the mesentery and omentum consistent with progressive peritoneal metastatic disease. 3. Multiple splenic infarcts mostly chronic with age indeterminate small infarct more inferior Electronically Signed   By: KDonavan FoilM.D.   On: 10/29/2021 17:31    ASSESSMENT AND PLAN: This is a very pleasant 51years old white male with stage IV (T1b, N2, M1 a) non-small cell lung cancer, adenocarcinoma presented with right lower lobe lung nodule in addition to right paratracheal and subcarinal lymphadenopathy and malignant right pleural effusion as well as pleural metastatic disease diagnosed in March 2023. The patient has no actionable mutations and PD-L1 expression was 1%. He underwent systemic chemotherapy with carboplatin for AUC of 5, Alimta 400 Mg/M2 and Keytruda 200 Mg IV every 3 weeks.  Status post 3 cycles.  His dose of Alimta was reduced to 400 Mg/M2 starting from cycle #2 secondary to renal insufficiency and intolerance.  He has a rough time with this treatment with increasing fatigue and weakness as well as pancytopenia. The patient had evidence for disease progression  and he started second line systemic chemotherapy with reduced dose docetaxel 65 Mg/M2 and Cyramza 10 Mg/KG every 3 weeks with Neulasta support.  Status post 1 cycle.  He tolerated the treatment well with no concerning complaints but his disease is progressing at a very fast pace.  He had CT scan of the abdomen pelvis performed on 10/29/2021 that showed new small amount of left posterior pleural effusion or pleural disease in addition to increased abdominal pelvic ascites now moderate volume with increased soft tissue infiltration and nodularity of the mesentery and omentum consistent with progressive peritoneal metastatic disease.  He also has multiple splenic infarcts mostly chronic with age indeterminate. He was seen 10 days ago at the emergency department with desaturation and CT angiogram of the chest showed no evidence for  pulmonary embolism.  He continues to have the evidence for disease progression with the peritoneal metastasis and the reaccumulation of ascites.  The patient underwent paracentesis twice with drainage of 3.2 L initially and then 4.6 L the second time on 11/13/2021. I had a lengthy discussion with the patient and his wife today about his condition and other treatment options. I discussed with the patient and his wife consideration of palliative care and hospice versus continuation of the current systemic chemotherapy with docetaxel as a single agent or any combination with Cyramza versus treatment with gemcitabine as an alternative.  The patient will also see Dr. Mindi [facility]unk[facility] next week for evaluation and see if there is any clinical trial that could benefit him at this point. I will cancel his current systemic chemotherapy as the patient is not interested in proceeding with docetaxel and Cyramza. I will see him on as-needed basis at this point after evaluation at [facility] For the pain management, he will continue his current treatment with MS Contin and oxycodone. For the ascites, he may benefit from repeat paracentesis on as-needed basis for consideration of catheter placement for drainage. For the weight loss and lack of appetite, he will continue his current treatment with Remeron and Marinol. He was advised to call immediately if he has any other concerning symptoms in the interval. I will arrange for the patient a follow-up appointment after discharge for reevaluation and more discussion of his treatment options if the decline palliative care and hospice.  Disclaimer: This note was dictated with voice recognition software. Similar sounding words can inadvertently be transcribed and may not be corrected upon review.

## 2021-11-16 ENCOUNTER — Inpatient Hospital Stay: Payer: Commercial Managed Care - PPO

## 2021-11-19 ENCOUNTER — Telehealth: Payer: Self-pay

## 2021-11-19 ENCOUNTER — Other Ambulatory Visit: Payer: Self-pay | Admitting: Physician Assistant

## 2021-11-19 DIAGNOSIS — C3491 Malignant neoplasm of unspecified part of right bronchus or lung: Secondary | ICD-10-CM

## 2021-11-19 MED ORDER — DRONABINOL 2.5 MG PO CAPS: 2.5000 mg | ORAL_CAPSULE | Freq: Two times a day (BID) | ORAL | 0 refills | Status: DC

## 2021-11-19 NOTE — Telephone Encounter (Signed)
pt called asking for a refill on Marinol and to schedule another paracentesis. pt states that his belly is becoming swollen again, however is is not painful or causing him any shortness of breath like it has in the past, message sent to [facility] [place], [place], and orders were placed for marinol and another paracentesis. Pt scheduled by central scheduling for paracentesis, pt made aware that marinol was refilled, no further needs at this time.

## 2021-11-20 ENCOUNTER — Ambulatory Visit (HOSPITAL_COMMUNITY)
Admission: RE | Admit: 2021-11-20 | Discharge: 2021-11-20 | Disposition: A | Discharge status: Home / Self Care | Payer: Commercial Managed Care - PPO | Source: Ambulatory Visit | Attending: Physician Assistant | Admitting: Physician Assistant

## 2021-11-20 DIAGNOSIS — C3491 Malignant neoplasm of unspecified part of right bronchus or lung: Secondary | ICD-10-CM

## 2021-11-20 MED ORDER — LIDOCAINE HCL 1 % IJ SOLN
INTRAMUSCULAR | Status: AC
  Administered 2021-11-20: 10 mL
  Filled 2021-11-20: qty 20

## 2021-11-20 NOTE — Procedures (Signed)
PROCEDURE SUMMARY:  Successful image-guided paracentesis from the right lower abdomen.  Yielded 3.5 liters of hazy yellow fluid.  No immediate complications.  EBL = trace. Patient tolerated well.   Specimen was not sent for labs.  Please see imaging section of Epic for full dictation.   Armando Gang Deseree Zemaitis PA-C 11/20/2021 12:29 PM

## 2021-11-21 ENCOUNTER — Inpatient Hospital Stay: Payer: Commercial Managed Care - PPO | Admitting: Nurse Practitioner

## 2021-11-21 ENCOUNTER — Inpatient Hospital Stay: Payer: Commercial Managed Care - PPO

## 2021-11-22 ENCOUNTER — Encounter: Payer: Self-pay | Admitting: Internal Medicine

## 2021-11-26 ENCOUNTER — Other Ambulatory Visit: Payer: Self-pay

## 2021-11-26 DIAGNOSIS — C3491 Malignant neoplasm of unspecified part of right bronchus or lung: Secondary | ICD-10-CM

## 2021-11-26 DIAGNOSIS — G893 Neoplasm related pain (acute) (chronic): Secondary | ICD-10-CM

## 2021-11-26 DIAGNOSIS — Z7189 Other specified counseling: Secondary | ICD-10-CM

## 2021-11-26 DIAGNOSIS — C3490 Malignant neoplasm of unspecified part of unspecified bronchus or lung: Secondary | ICD-10-CM

## 2021-11-26 DIAGNOSIS — K5903 Drug induced constipation: Secondary | ICD-10-CM

## 2021-11-26 DIAGNOSIS — R630 Anorexia: Secondary | ICD-10-CM

## 2021-11-26 DIAGNOSIS — Z515 Encounter for palliative care: Secondary | ICD-10-CM

## 2021-11-26 DIAGNOSIS — R530 Neoplastic (malignant) related fatigue: Secondary | ICD-10-CM

## 2021-11-26 DIAGNOSIS — F419 Anxiety disorder, unspecified: Secondary | ICD-10-CM

## 2021-11-26 MED ORDER — DIAZEPAM 2 MG PO TABS: 2.0000 mg | ORAL_TABLET | Freq: Two times a day (BID) | ORAL | 0 refills | Status: AC | PRN

## 2021-11-26 MED ORDER — MIRTAZAPINE 30 MG PO TABS
30.0000 mg | ORAL_TABLET | Freq: Every day | ORAL | 0 refills | Status: DC
Start: 2021-11-26 — End: 2021-12-19

## 2021-11-26 NOTE — Telephone Encounter (Signed)
Pt called, refill request sent in to preferred pharmacy

## 2021-11-26 NOTE — Progress Notes (Signed)
Pt called stating he is having more fluid build up, paracentesis ordered.

## 2021-11-27 ENCOUNTER — Ambulatory Visit (HOSPITAL_COMMUNITY)
Admission: RE | Admit: 2021-11-27 | Discharge: 2021-11-27 | Disposition: A | Discharge status: Home / Self Care | Payer: Commercial Managed Care - PPO | Source: Ambulatory Visit | Attending: Nurse Practitioner | Admitting: Nurse Practitioner

## 2021-11-27 ENCOUNTER — Telehealth: Payer: Self-pay

## 2021-11-27 DIAGNOSIS — R188 Other ascites: Secondary | ICD-10-CM | POA: Insufficient documentation

## 2021-11-27 DIAGNOSIS — C3491 Malignant neoplasm of unspecified part of right bronchus or lung: Secondary | ICD-10-CM | POA: Insufficient documentation

## 2021-11-27 DIAGNOSIS — C3490 Malignant neoplasm of unspecified part of unspecified bronchus or lung: Secondary | ICD-10-CM

## 2021-11-27 HISTORY — PX: IR PARACENTESIS: IMG2679

## 2021-11-27 MED ORDER — LIDOCAINE HCL (PF) 1 % IJ SOLN
INTRAMUSCULAR | Status: DC | PRN
  Administered 2021-11-27: 10 mL

## 2021-11-27 MED ORDER — LIDOCAINE HCL 1 % IJ SOLN
INTRAMUSCULAR | Status: AC
  Filled 2021-11-27: qty 20

## 2021-11-27 NOTE — Telephone Encounter (Signed)
Returned call regarding request to move up date for paracentesis. Spoke with Tommi Rumps at [facility] who had availability to see patient today, 8/15, at 2:00 PM for paracentesis.  Patient and wife aware of the new appointment date and time. Requested that they arrive by 1:30 at [facility] to be checked in.

## 2021-11-28 ENCOUNTER — Inpatient Hospital Stay: Payer: Commercial Managed Care - PPO

## 2021-11-28 NOTE — Procedures (Signed)
PROCEDURE SUMMARY:  Successful US guided paracentesis from left lateral abdomen.  Yielded 2.5 liters of clear yellow fluid.  No immediate complications.  Patient tolerated well.  EBL = trace  Karilyn Wind S Braison Snoke PA-C 11/28/2021 8:44 AM

## 2021-11-29 ENCOUNTER — Ambulatory Visit (HOSPITAL_COMMUNITY): Payer: Commercial Managed Care - PPO

## 2021-11-29 ENCOUNTER — Inpatient Hospital Stay (HOSPITAL_BASED_OUTPATIENT_CLINIC_OR_DEPARTMENT_OTHER): Payer: Commercial Managed Care - PPO | Admitting: Nurse Practitioner

## 2021-11-29 ENCOUNTER — Encounter: Payer: Self-pay | Admitting: Nurse Practitioner

## 2021-11-29 ENCOUNTER — Other Ambulatory Visit: Payer: Self-pay

## 2021-11-29 VITALS — BP 118/78 | HR 102 | Temp 97.6°F | Resp 14 | Ht 72.0 in | Wt 162.9 lb

## 2021-11-29 DIAGNOSIS — G893 Neoplasm related pain (acute) (chronic): Secondary | ICD-10-CM | POA: Diagnosis not present

## 2021-11-29 DIAGNOSIS — R530 Neoplastic (malignant) related fatigue: Secondary | ICD-10-CM

## 2021-11-29 DIAGNOSIS — C3431 Malignant neoplasm of lower lobe, right bronchus or lung: Secondary | ICD-10-CM | POA: Diagnosis not present

## 2021-11-29 DIAGNOSIS — K5903 Drug induced constipation: Secondary | ICD-10-CM | POA: Diagnosis not present

## 2021-11-29 DIAGNOSIS — Z7189 Other specified counseling: Secondary | ICD-10-CM

## 2021-11-29 DIAGNOSIS — C3491 Malignant neoplasm of unspecified part of right bronchus or lung: Secondary | ICD-10-CM | POA: Diagnosis not present

## 2021-11-29 DIAGNOSIS — Z515 Encounter for palliative care: Secondary | ICD-10-CM | POA: Diagnosis not present

## 2021-11-29 MED ORDER — MORPHINE SULFATE ER 30 MG PO TBCR: 30.0000 mg | EXTENDED_RELEASE_TABLET | Freq: Two times a day (BID) | ORAL | 0 refills | Status: DC

## 2021-11-29 NOTE — Progress Notes (Signed)
[place]  [phone]) [phone] Fax:(336) [phone]   Name: [name] Potts Date: 11/29/2021 MRN: [id number]  DOB: 15-Dec-1970  Patient Care Team: [name], MD as PCP - General (Internal Medicine) Gala Romney, Cristopher Estimable, MD (Gastroenterology) Pickenpack-Cousar, Carlena Sax, NP as Nurse Practitioner (Nurse Practitioner)    INTERVAL HISTORY: [name] Potts is a 51 y.o. male with medical history including non-small cell lung cancer s/p carbo/Alimta/Keytruda now on second line systemic therapy including docetaxel and Cyramza (first cycle on 7/7), chronic back pain, and ADHD.  Palliative ask to see for symptom management and goals of care.   SOCIAL HISTORY:     reports that he quit smoking about 8 months ago. His smoking use included cigarettes. He smoked an average of .5 packs per day. His smokeless tobacco use includes snuff. He reports that he does not currently use alcohol. He reports that he does not currently use drugs after having used the following drugs: Marijuana.  ADVANCE DIRECTIVES:    CODE STATUS: DNR  PAST MEDICAL HISTORY: Past Medical History:  Diagnosis Date   ADHD (attention deficit hyperactivity disorder)    Chronic back pain    Complication of anesthesia    woke up during 1 of 3 colonoscopies   Constipation    Dyspnea    Pneumonia    Seasonal allergies     ALLERGIES:  has No Known Allergies.  MEDICATIONS:  Current Outpatient Medications  Medication Sig Dispense Refill   amphetamine-dextroamphetamine (ADDERALL XR) 15 MG 24 hr capsule Take 1 capsule by mouth daily. 30 capsule 0   apixaban (ELIQUIS) 5 MG TABS tablet Take 1 tablet (5 mg total) by mouth 2 (two) times daily. Restart 7/26 if no further bleeding 60 tablet 1   cetirizine (ZYRTEC) 10 MG tablet Take 10 mg by mouth in the morning.     dexamethasone (DECADRON) 4 MG tablet Take 1 tablet (4 mg total) by mouth daily. 30 tablet 3   diazepam (VALIUM) 2 MG tablet Take 1 tablet  (2 mg total) by mouth every 12 (twelve) hours as needed for anxiety. 60 tablet 0   dronabinol (MARINOL) 2.5 MG capsule Take 1 capsule (2.5 mg total) by mouth 2 (two) times daily before a meal. 60 capsule 0   famotidine (PEPCID) 40 MG tablet Take 1 tablet (40 mg total) by mouth every evening. 30 tablet 1   FeFum-FePoly-FA-B Cmp-C-Biot (INTEGRA PLUS) CAPS Take 1 capsule by mouth daily. 30 capsule 3   folic acid (FOLVITE) 1 MG tablet Take 1 tablet (1 mg total) by mouth daily. 30 tablet 4   lidocaine-prilocaine (EMLA) cream Apply to the Port-A-Cath site 30 minutes before chemotherapy (Patient taking differently: Apply 1 Application topically See admin instructions. Apply to the Port-A-Cath site 30 minutes before chemotherapy) 30 g 0   magnesium oxide (MAG-OX) 400 (240 Mg) MG tablet Take 1 tablet (400 mg total) by mouth daily. 30 tablet 3   mirtazapine (REMERON) 30 MG tablet Take 1 tablet (30 mg total) by mouth at bedtime. 30 tablet 0   morphine (MS CONTIN) 30 MG 12 hr tablet Take 1 tablet (30 mg total) by mouth every 12 (twelve) hours. 60 tablet 0   oxyCODONE (OXY IR/ROXICODONE) 5 MG immediate release tablet Take 1 tablet (5 mg total) by mouth every 6 (six) hours as needed for moderate pain or severe pain. (Patient taking differently: Take 5 mg by mouth every 6 (six) hours as needed (for breakthrough pain).) 60 tablet 0  polyethylene glycol (MIRALAX / GLYCOLAX) 17 g packet Take 17 g by mouth daily as needed for mild constipation. 14 each 0   prochlorperazine (COMPAZINE) 10 MG tablet TAKE 1 TABLET BY MOUTH EVERY 6 HOURS AS NEEDED FOR NAUSEA FOR VOMITING (Patient taking differently: Take 10 mg by mouth every 6 (six) hours.) 30 tablet 2   No current facility-administered medications for this visit.    VITAL SIGNS: BP 118/78 (BP Location: Right Arm, Patient Position: Sitting)   Pulse (!) 102 Comment: provider notified  Temp 97.6 F (36.4 C) (Oral)   Resp 14   Ht 6' (1.829 m)   Wt 162 lb 14.4 oz (73.9  kg)   SpO2 100%   BMI 22.09 kg/m  Filed Weights   11/29/21 0901  Weight: 162 lb 14.4 oz (73.9 kg)    Estimated body mass index is 22.09 kg/m as calculated from the following:   Height as of this encounter: 6' (1.829 m).   Weight as of this encounter: 162 lb 14.4 oz (73.9 kg).   PERFORMANCE STATUS (ECOG) : 1 - Symptomatic but completely ambulatory   Physical Exam General: NAD, ambulatory  Cardiovascular: RRR Pulmonary: normal breathing pattern Abdomen:  nontender, + bowel sounds Extremities: no edema, no joint deformities Skin: no rashes Neurological: Weakness but otherwise nonfocal  IMPRESSION: [name] Potts and his wife presents to clinic today for symptom management follow-up. No acute distress noted. States he is feeling great. Is requiring frequent paracentesis.   Paracentesis 7/20 (3.2L), 8/1 (4.6L), 8/8 (3.4L), and 8/15 (2.5L).  We had discussions regarding concerns with frequent need for paracentesis and large volumes removed. I approached discussions regarding Pluerx catheter placement to allow for home management of ascites. They would like to continue to closely evaluate for a few more weeks before any decisions.   He was seen at [facility] and shares he is eligible for recent clinical trial. They are hopeful to get a call soon allowing him to start with awareness he may have to travel to [place], [place] or [place], [place]. They are willing to travel and do whatever is needed to allow him every opportunity to continue to thrive.   He is taking some homeopathic supplements (apricot seed and sawsop).    Neoplasm related pain Pain is well controlled on current regimen. We discussed at length current regimen. He is tolerating MS Contin 30 mg with Oxy IR for breakthrough pain. Does not have to take around the clock.   We will continue to monitor and adjust as needed.   Constipation Controlled with Miralax and senna. Education provided on continued regimen in the setting of opioid  use.   Decreased appetite [name] Potts states appetite is improving. Tolerating Marinol 5mg .   We discussed eating small frequent meals instead of 3 large meals. Also ways to increase protein intake. Also discussed sweet and sour food options to assist with taste and allowing him to enjoy some items.    Goals of Care   7/26: We discussed Her current illness and what it means in the larger context of Her on-going co-morbidities. Natural disease trajectory and expectations were discussed.  [name] Potts shares that he and his wife are not ready to "give up" and wish to continue his cancer fight. He is unsure he would want to continue current chemo regimen with awareness it is not improving his quality of life or cancer. He is inquiring about clinical trials or possible evaluation for additional options. Encouraged patient to discuss further with Dr. Julien Nordmann at upcoming  visit to gain better understanding of options and next steps.   [name] Potts is clear in expressed wishes to continue to treat allowing him every opportunity to thrive. His quality of life is important to him and is appreciative for ongoing symptom management. He is not open to comfort focused care until he knows all options and efforts have been exhausted. Emotional support provided.   I discussed the importance of continued conversation with family and their medical providers regarding overall plan of care and treatment options, ensuring decisions are within the context of the patients values and GOCs.  PLAN: MS Contin 30 mg twice daily Oxy IR 5-10mg  as needed for breakthrough pain Adderall 15 mg (decreased from 30mg ) Miralax twice daily for bowel regimen Valium 2mg  as needed Ongoing goals of care discussions I will plan to see patient back in 3-4 weeks. He knows to contact office sooner if needed.    Patient expressed understanding and was in agreement with this plan. He also understands that He can call the clinic at any time with any  questions, concerns, or complaints.    Any controlled substances utilized were prescribed in the context of palliative care. PDMP has been reviewed.   Time Total: 40 min   Visit consisted of counseling and education dealing with the complex and emotionally intense issues of symptom management and palliative care in the setting of serious and potentially life-threatening illness.Greater than 50%  of this time was spent counseling and coordinating care related to the above assessment and plan.  Alda Lea, AGPCNP-BC  Palliative Medicine Team/[place]

## 2021-11-30 ENCOUNTER — Other Ambulatory Visit: Payer: Self-pay | Admitting: Nurse Practitioner

## 2021-11-30 ENCOUNTER — Telehealth: Payer: Self-pay

## 2021-11-30 DIAGNOSIS — Z515 Encounter for palliative care: Secondary | ICD-10-CM

## 2021-11-30 DIAGNOSIS — G893 Neoplasm related pain (acute) (chronic): Secondary | ICD-10-CM

## 2021-11-30 DIAGNOSIS — C3491 Malignant neoplasm of unspecified part of right bronchus or lung: Secondary | ICD-10-CM

## 2021-11-30 MED ORDER — MORPHINE SULFATE ER 30 MG PO TBCR: 30.0000 mg | EXTENDED_RELEASE_TABLET | Freq: Two times a day (BID) | ORAL | 0 refills | Status: AC

## 2021-11-30 NOTE — Telephone Encounter (Signed)
Pt wife called reporting their pharmacy was out of MS contin, requested medication be sent to another pharmacy. Attempted to return call to notify pt and wife of change, LVM and callback number

## 2021-12-03 ENCOUNTER — Other Ambulatory Visit: Payer: Self-pay

## 2021-12-03 DIAGNOSIS — C3431 Malignant neoplasm of lower lobe, right bronchus or lung: Secondary | ICD-10-CM

## 2021-12-03 DIAGNOSIS — G893 Neoplasm related pain (acute) (chronic): Secondary | ICD-10-CM

## 2021-12-03 DIAGNOSIS — C3490 Malignant neoplasm of unspecified part of unspecified bronchus or lung: Secondary | ICD-10-CM

## 2021-12-03 DIAGNOSIS — C3491 Malignant neoplasm of unspecified part of right bronchus or lung: Secondary | ICD-10-CM

## 2021-12-03 NOTE — Progress Notes (Signed)
Pt wife called this morning to schedule another paracentesis, schedule for 12/04/21 at 230pm, pt and wife made aware, refill request for iron sent to Dr.Mohammed's RN.

## 2021-12-04 ENCOUNTER — Ambulatory Visit (HOSPITAL_COMMUNITY)
Admission: RE | Admit: 2021-12-04 | Discharge: 2021-12-04 | Disposition: A | Discharge status: Home / Self Care | Payer: Commercial Managed Care - PPO | Source: Ambulatory Visit | Attending: Nurse Practitioner | Admitting: Nurse Practitioner

## 2021-12-04 DIAGNOSIS — C3431 Malignant neoplasm of lower lobe, right bronchus or lung: Secondary | ICD-10-CM

## 2021-12-04 DIAGNOSIS — R180 Malignant ascites: Secondary | ICD-10-CM | POA: Diagnosis present

## 2021-12-04 DIAGNOSIS — C3491 Malignant neoplasm of unspecified part of right bronchus or lung: Secondary | ICD-10-CM

## 2021-12-04 DIAGNOSIS — C3490 Malignant neoplasm of unspecified part of unspecified bronchus or lung: Secondary | ICD-10-CM

## 2021-12-04 MED ORDER — LIDOCAINE HCL 1 % IJ SOLN
INTRAMUSCULAR | Status: AC
  Filled 2021-12-04: qty 20

## 2021-12-04 NOTE — Procedures (Signed)
Ultrasound-guided therapeutic paracentesis performed yielding 3.8 liters of blood-tinged  fluid. No immediate complications.EBL none.

## 2021-12-05 ENCOUNTER — Ambulatory Visit: Payer: Commercial Managed Care - PPO | Admitting: Internal Medicine

## 2021-12-05 ENCOUNTER — Other Ambulatory Visit: Payer: Commercial Managed Care - PPO

## 2021-12-05 ENCOUNTER — Ambulatory Visit: Payer: Commercial Managed Care - PPO

## 2021-12-05 ENCOUNTER — Encounter: Payer: Commercial Managed Care - PPO | Admitting: Dietician

## 2021-12-06 ENCOUNTER — Encounter: Payer: Self-pay | Admitting: Internal Medicine

## 2021-12-07 ENCOUNTER — Ambulatory Visit: Payer: Commercial Managed Care - PPO

## 2021-12-07 ENCOUNTER — Telehealth: Payer: Self-pay

## 2021-12-07 ENCOUNTER — Other Ambulatory Visit: Payer: Self-pay | Admitting: Internal Medicine

## 2021-12-07 ENCOUNTER — Other Ambulatory Visit: Payer: Self-pay

## 2021-12-07 MED ORDER — AMPHETAMINE-DEXTROAMPHET ER 15 MG PO CP24: 15.0000 mg | ORAL_CAPSULE | Freq: Every day | ORAL | 0 refills | Status: AC

## 2021-12-07 NOTE — Telephone Encounter (Signed)
Call received from pt and his wife advising they have been contacted about a clinical trial.  They state it is via the [facility] for a screening appt but they need records faxed. The contact person is as follows:  Toniann Ket - Referral Coordinator  [facility]: [phone] FX: [phone]  The following has been faxed as requested  Last 3 progress notes Most recent labs Most recent CT scan PET Scan Surgical path Moleculars   Confirmation was received. I have also called the pt and his wife back and advised this has been completed. They thanked me for the update.

## 2021-12-10 ENCOUNTER — Other Ambulatory Visit: Payer: Self-pay | Admitting: Internal Medicine

## 2021-12-10 ENCOUNTER — Other Ambulatory Visit: Payer: Self-pay

## 2021-12-10 ENCOUNTER — Telehealth: Payer: Self-pay

## 2021-12-10 ENCOUNTER — Other Ambulatory Visit: Payer: Self-pay | Admitting: Physician Assistant

## 2021-12-10 DIAGNOSIS — C3491 Malignant neoplasm of unspecified part of right bronchus or lung: Secondary | ICD-10-CM

## 2021-12-10 NOTE — Progress Notes (Signed)
Standing orders for paracentesis entered per verbal order from Alda Lea, NP.

## 2021-12-10 NOTE — Telephone Encounter (Signed)
Paracentesis scheduled for tomorrow, 8/29 at 2:30pm per patient wife's request. Voicemail left to [name] Traywick's phone regarding new appointment date and time.

## 2021-12-11 ENCOUNTER — Ambulatory Visit (HOSPITAL_COMMUNITY)
Admission: RE | Admit: 2021-12-11 | Discharge: 2021-12-11 | Disposition: A | Discharge status: Home / Self Care | Payer: Commercial Managed Care - PPO | Source: Ambulatory Visit | Attending: Nurse Practitioner | Admitting: Nurse Practitioner

## 2021-12-11 DIAGNOSIS — C3491 Malignant neoplasm of unspecified part of right bronchus or lung: Secondary | ICD-10-CM | POA: Insufficient documentation

## 2021-12-11 DIAGNOSIS — R180 Malignant ascites: Secondary | ICD-10-CM | POA: Diagnosis present

## 2021-12-11 MED ORDER — LIDOCAINE HCL 1 % IJ SOLN
INTRAMUSCULAR | Status: AC
  Administered 2021-12-11: 10 mL
  Filled 2021-12-11: qty 20

## 2021-12-11 NOTE — Procedures (Signed)
Ultrasound-guided therapeutic paracentesis performed yielding 3.2 liters of bloody  fluid. No immediate complications. EBL none. Dr. Worthy Flank office was notified of above findings.

## 2021-12-12 ENCOUNTER — Other Ambulatory Visit: Payer: Self-pay

## 2021-12-12 ENCOUNTER — Other Ambulatory Visit: Payer: Commercial Managed Care - PPO

## 2021-12-12 MED ORDER — DRONABINOL 2.5 MG PO CAPS: 2.5000 mg | ORAL_CAPSULE | Freq: Two times a day (BID) | ORAL | 0 refills | Status: DC

## 2021-12-12 NOTE — Telephone Encounter (Signed)
Records have been faxed again as the area code may have been entered incorrectly. The molecular studies were confirmed received by the coordinator.  Toniann Ket - Referral Coordinator  FX: [phone]   The following has been faxed as requested   Last 3 progress notes Most recent labs Most recent CT scan PET Scan   Confirmation was received.

## 2021-12-12 NOTE — Telephone Encounter (Signed)
[name] Potts called requesting that paracentesis be scheduled for 12/18/21 and also requesting refill on Marinol. Apt obtained for 11 AM  on 12/18/21 at [facility] with arrival 15 minutes prior.

## 2021-12-13 ENCOUNTER — Other Ambulatory Visit: Payer: Self-pay | Admitting: Nurse Practitioner

## 2021-12-13 MED ORDER — DRONABINOL 2.5 MG PO CAPS: 2.5000 mg | ORAL_CAPSULE | Freq: Two times a day (BID) | ORAL | 0 refills | Status: AC

## 2021-12-18 ENCOUNTER — Telehealth: Payer: Self-pay

## 2021-12-18 ENCOUNTER — Other Ambulatory Visit: Payer: Self-pay | Admitting: Nurse Practitioner

## 2021-12-18 ENCOUNTER — Ambulatory Visit (HOSPITAL_COMMUNITY)
Admission: RE | Admit: 2021-12-18 | Discharge: 2021-12-18 | Disposition: A | Discharge status: Home / Self Care | Payer: Commercial Managed Care - PPO | Source: Ambulatory Visit | Attending: Nurse Practitioner | Admitting: Nurse Practitioner

## 2021-12-18 DIAGNOSIS — I63512 Cerebral infarction due to unspecified occlusion or stenosis of left middle cerebral artery: Secondary | ICD-10-CM | POA: Diagnosis not present

## 2021-12-18 DIAGNOSIS — C3491 Malignant neoplasm of unspecified part of right bronchus or lung: Secondary | ICD-10-CM | POA: Insufficient documentation

## 2021-12-18 DIAGNOSIS — I639 Cerebral infarction, unspecified: Secondary | ICD-10-CM | POA: Diagnosis not present

## 2021-12-18 MED ORDER — LIDOCAINE HCL 1 % IJ SOLN
INTRAMUSCULAR | Status: AC
  Administered 2021-12-18: 15 mL
  Filled 2021-12-18: qty 20

## 2021-12-18 NOTE — Telephone Encounter (Signed)
Pt wife came to front desk of the cancer center asking to speak with RN, pt wife, amy, reported that since Sunday pt developed stuttering with his speech, that did not improve throughout the day. [name] also reported that on Monday pt has worsening stuttering and mild disorientation/ forgetfulness. Pt was currently at [facility] getting a paracentesis, I sent amy back to pick him up. This RN spoke with Lexine Baton, NP and Dr.Mohamed, who both recommended pt go to ED for work up. Pt wife [name] called several times, without answer to take pt to ED after paracentesis, LVM and call back information.   This RN called again to ensure pt and [name] received message. [name] picked up and this RN educated on the importance of going to ED for work up. Pt and wife declined ED at this time verbalizing understanding of the importance of seeking medical care at this time. RN reinforced the need for pt to seek medical care due to his recent and sudden changes. [name] verbalized understanding to call emergency services should pt get worse or condition changes.

## 2021-12-18 NOTE — Procedures (Signed)
PROCEDURE SUMMARY:  Successful US guided paracentesis from left lower abdomen.  Yielded 2 L of bloody fluid.  No immediate complications.  Pt tolerated well.   Specimen not sent for labs.  EBL < 2 mL  Theresa Duty, NP 12/18/2021 1:06 PM

## 2021-12-18 NOTE — Telephone Encounter (Signed)
Pt wife, [name] called states that she will take pt to ED tomorrow morning, pt and wfie agreeable to call EMS if pt condition worsens or changes.

## 2021-12-19 ENCOUNTER — Other Ambulatory Visit: Payer: Commercial Managed Care - PPO

## 2021-12-19 ENCOUNTER — Emergency Department (HOSPITAL_COMMUNITY): Payer: Commercial Managed Care - PPO

## 2021-12-19 ENCOUNTER — Other Ambulatory Visit: Payer: Self-pay

## 2021-12-19 ENCOUNTER — Encounter (HOSPITAL_COMMUNITY): Payer: Self-pay | Admitting: *Deleted

## 2021-12-19 ENCOUNTER — Encounter: Payer: Self-pay | Admitting: Internal Medicine

## 2021-12-19 ENCOUNTER — Inpatient Hospital Stay (HOSPITAL_COMMUNITY)
Admission: EM | Admit: 2021-12-19 | Discharge: 2021-12-21 | DRG: 064 | Disposition: A | Discharge status: Hospice | Payer: Commercial Managed Care - PPO | Attending: Internal Medicine | Admitting: Internal Medicine

## 2021-12-19 DIAGNOSIS — Z720 Tobacco use: Secondary | ICD-10-CM

## 2021-12-19 DIAGNOSIS — D72829 Elevated white blood cell count, unspecified: Secondary | ICD-10-CM | POA: Diagnosis present

## 2021-12-19 DIAGNOSIS — D630 Anemia in neoplastic disease: Secondary | ICD-10-CM | POA: Diagnosis present

## 2021-12-19 DIAGNOSIS — F909 Attention-deficit hyperactivity disorder, unspecified type: Secondary | ICD-10-CM | POA: Diagnosis present

## 2021-12-19 DIAGNOSIS — C786 Secondary malignant neoplasm of retroperitoneum and peritoneum: Secondary | ICD-10-CM | POA: Diagnosis present

## 2021-12-19 DIAGNOSIS — J302 Other seasonal allergic rhinitis: Secondary | ICD-10-CM | POA: Diagnosis present

## 2021-12-19 DIAGNOSIS — D6481 Anemia due to antineoplastic chemotherapy: Secondary | ICD-10-CM | POA: Diagnosis present

## 2021-12-19 DIAGNOSIS — F419 Anxiety disorder, unspecified: Secondary | ICD-10-CM | POA: Diagnosis present

## 2021-12-19 DIAGNOSIS — C7989 Secondary malignant neoplasm of other specified sites: Secondary | ICD-10-CM | POA: Diagnosis present

## 2021-12-19 DIAGNOSIS — Z800 Family history of malignant neoplasm of digestive organs: Secondary | ICD-10-CM

## 2021-12-19 DIAGNOSIS — G8929 Other chronic pain: Secondary | ICD-10-CM | POA: Diagnosis present

## 2021-12-19 DIAGNOSIS — I330 Acute and subacute infective endocarditis: Secondary | ICD-10-CM | POA: Diagnosis present

## 2021-12-19 DIAGNOSIS — D6959 Other secondary thrombocytopenia: Secondary | ICD-10-CM | POA: Diagnosis present

## 2021-12-19 DIAGNOSIS — D7589 Other specified diseases of blood and blood-forming organs: Secondary | ICD-10-CM | POA: Diagnosis present

## 2021-12-19 DIAGNOSIS — E785 Hyperlipidemia, unspecified: Secondary | ICD-10-CM | POA: Diagnosis present

## 2021-12-19 DIAGNOSIS — Z66 Do not resuscitate: Secondary | ICD-10-CM | POA: Diagnosis present

## 2021-12-19 DIAGNOSIS — D6869 Other thrombophilia: Secondary | ICD-10-CM | POA: Diagnosis present

## 2021-12-19 DIAGNOSIS — Z823 Family history of stroke: Secondary | ICD-10-CM

## 2021-12-19 DIAGNOSIS — Z7901 Long term (current) use of anticoagulants: Secondary | ICD-10-CM

## 2021-12-19 DIAGNOSIS — Z86718 Personal history of other venous thrombosis and embolism: Secondary | ICD-10-CM | POA: Diagnosis not present

## 2021-12-19 DIAGNOSIS — Z515 Encounter for palliative care: Secondary | ICD-10-CM

## 2021-12-19 DIAGNOSIS — I639 Cerebral infarction, unspecified: Secondary | ICD-10-CM

## 2021-12-19 DIAGNOSIS — J948 Other specified pleural conditions: Secondary | ICD-10-CM | POA: Diagnosis present

## 2021-12-19 DIAGNOSIS — C3491 Malignant neoplasm of unspecified part of right bronchus or lung: Secondary | ICD-10-CM | POA: Diagnosis present

## 2021-12-19 DIAGNOSIS — D696 Thrombocytopenia, unspecified: Secondary | ICD-10-CM | POA: Diagnosis present

## 2021-12-19 DIAGNOSIS — I82501 Chronic embolism and thrombosis of unspecified deep veins of right lower extremity: Secondary | ICD-10-CM | POA: Diagnosis present

## 2021-12-19 DIAGNOSIS — D649 Anemia, unspecified: Secondary | ICD-10-CM | POA: Diagnosis not present

## 2021-12-19 DIAGNOSIS — T451X5A Adverse effect of antineoplastic and immunosuppressive drugs, initial encounter: Secondary | ICD-10-CM | POA: Diagnosis present

## 2021-12-19 DIAGNOSIS — R180 Malignant ascites: Secondary | ICD-10-CM | POA: Diagnosis present

## 2021-12-19 DIAGNOSIS — R471 Dysarthria and anarthria: Secondary | ICD-10-CM

## 2021-12-19 DIAGNOSIS — I63512 Cerebral infarction due to unspecified occlusion or stenosis of left middle cerebral artery: Secondary | ICD-10-CM | POA: Diagnosis present

## 2021-12-19 DIAGNOSIS — R4701 Aphasia: Secondary | ICD-10-CM | POA: Diagnosis present

## 2021-12-19 DIAGNOSIS — G8321 Monoplegia of upper limb affecting right dominant side: Secondary | ICD-10-CM | POA: Diagnosis present

## 2021-12-19 DIAGNOSIS — R29810 Facial weakness: Secondary | ICD-10-CM | POA: Diagnosis present

## 2021-12-19 DIAGNOSIS — R29702 NIHSS score 2: Secondary | ICD-10-CM | POA: Diagnosis present

## 2021-12-19 DIAGNOSIS — Z79899 Other long term (current) drug therapy: Secondary | ICD-10-CM

## 2021-12-19 DIAGNOSIS — Z7189 Other specified counseling: Secondary | ICD-10-CM | POA: Diagnosis not present

## 2021-12-19 DIAGNOSIS — I63412 Cerebral infarction due to embolism of left middle cerebral artery: Secondary | ICD-10-CM | POA: Diagnosis not present

## 2021-12-19 LAB — URINALYSIS, ROUTINE W REFLEX MICROSCOPIC
Bilirubin Urine: NEGATIVE
Glucose, UA: NEGATIVE mg/dL
Hgb urine dipstick: NEGATIVE
Ketones, ur: NEGATIVE mg/dL
Leukocytes,Ua: NEGATIVE
Nitrite: NEGATIVE
Protein, ur: NEGATIVE mg/dL
Specific Gravity, Urine: 1.02 (ref 1.005–1.030)
pH: 5 (ref 5.0–8.0)

## 2021-12-19 LAB — COMPREHENSIVE METABOLIC PANEL
ALT: 24 U/L (ref 0–44)
AST: 36 U/L (ref 15–41)
Albumin: 2.8 g/dL — ABNORMAL LOW (ref 3.5–5.0)
Alkaline Phosphatase: 143 U/L — ABNORMAL HIGH (ref 38–126)
Anion gap: 11 (ref 5–15)
BUN: 25 mg/dL — ABNORMAL HIGH (ref 6–20)
CO2: 23 mmol/L (ref 22–32)
Calcium: 8.6 mg/dL — ABNORMAL LOW (ref 8.9–10.3)
Chloride: 103 mmol/L (ref 98–111)
Creatinine, Ser: 1.38 mg/dL — ABNORMAL HIGH (ref 0.61–1.24)
GFR, Estimated: >60 mL/min (ref >60)
Glucose, Bld: 113 mg/dL — ABNORMAL HIGH (ref 70–99)
Potassium: 3.9 mmol/L (ref 3.5–5.1)
Sodium: 137 mmol/L (ref 135–145)
Total Bilirubin: 0.5 mg/dL (ref 0.3–1.2)
Total Protein: 6.4 g/dL — ABNORMAL LOW (ref 6.5–8.1)

## 2021-12-19 LAB — CBC WITH DIFFERENTIAL/PLATELET
Abs Immature Granulocytes: 0.49 10*3/uL — ABNORMAL HIGH (ref 0.00–0.07)
Basophils Absolute: 0 10*3/uL (ref 0.0–0.1)
Basophils Relative: 0 %
Eosinophils Absolute: 0 10*3/uL (ref 0.0–0.5)
Eosinophils Relative: 0 %
HCT: 27.4 % — ABNORMAL LOW (ref 39.0–52.0)
Hemoglobin: 8.3 g/dL — ABNORMAL LOW (ref 13.0–17.0)
Immature Granulocytes: 3 %
Lymphocytes Relative: 7 %
Lymphs Abs: 1.2 10*3/uL (ref 0.7–4.0)
MCH: 31 pg (ref 26.0–34.0)
MCHC: 30.3 g/dL (ref 30.0–36.0)
MCV: 102.2 fL — ABNORMAL HIGH (ref 80.0–100.0)
Monocytes Absolute: 0.5 10*3/uL (ref 0.1–1.0)
Monocytes Relative: 3 %
Neutro Abs: 15.4 10*3/uL — ABNORMAL HIGH (ref 1.7–7.7)
Neutrophils Relative %: 87 %
Platelets: 89 10*3/uL — ABNORMAL LOW (ref 150–400)
RBC: 2.68 MIL/uL — ABNORMAL LOW (ref 4.22–5.81)
RDW: 19.2 % — ABNORMAL HIGH (ref 11.5–15.5)
WBC: 17.6 10*3/uL — ABNORMAL HIGH (ref 4.0–10.5)
nRBC: 0.8 % — ABNORMAL HIGH (ref 0.0–0.2)

## 2021-12-19 MED ORDER — IOHEXOL 350 MG/ML SOLN
100.0000 mL | Freq: Once | INTRAVENOUS | Status: AC | PRN
  Administered 2021-12-19: 75 mL via INTRAVENOUS

## 2021-12-19 MED ORDER — FOLIC ACID 1 MG PO TABS
1.0000 mg | ORAL_TABLET | Freq: Every day | ORAL | Status: DC
  Administered 2021-12-20 – 2021-12-21 (×2): 1 mg via ORAL
  Filled 2021-12-19 (×2): qty 1

## 2021-12-19 MED ORDER — SENNOSIDES-DOCUSATE SODIUM 8.6-50 MG PO TABS
1.0000 | ORAL_TABLET | Freq: Every evening | ORAL | Status: DC | PRN
Start: 2021-12-19 — End: 2021-12-21

## 2021-12-19 MED ORDER — DEXAMETHASONE 4 MG PO TABS
4.0000 mg | ORAL_TABLET | Freq: Every day | ORAL | Status: DC
  Administered 2021-12-20 – 2021-12-21 (×2): 4 mg via ORAL
  Filled 2021-12-19 (×2): qty 1

## 2021-12-19 MED ORDER — APIXABAN 5 MG PO TABS
5.0000 mg | ORAL_TABLET | Freq: Two times a day (BID) | ORAL | Status: DC
  Administered 2021-12-19 – 2021-12-20 (×2): 5 mg via ORAL
  Filled 2021-12-19 (×2): qty 1

## 2021-12-19 MED ORDER — ENSURE ENLIVE PO LIQD
237.0000 mL | Freq: Two times a day (BID) | ORAL | Status: DC
  Administered 2021-12-20 – 2021-12-21 (×3): 237 mL via ORAL

## 2021-12-19 MED ORDER — OXYCODONE-ACETAMINOPHEN 5-325 MG PO TABS
1.0000 | ORAL_TABLET | Freq: Once | ORAL | Status: AC
  Administered 2021-12-19: 1 via ORAL
  Filled 2021-12-19: qty 1

## 2021-12-19 MED ORDER — MORPHINE SULFATE (PF) 2 MG/ML IV SOLN
2.0000 mg | INTRAVENOUS | Status: DC | PRN
  Administered 2021-12-20: 2 mg via INTRAVENOUS
  Filled 2021-12-19: qty 1

## 2021-12-19 MED ORDER — MAGNESIUM OXIDE -MG SUPPLEMENT 400 (240 MG) MG PO TABS
400.0000 mg | ORAL_TABLET | Freq: Every day | ORAL | Status: DC
  Administered 2021-12-20 – 2021-12-21 (×2): 400 mg via ORAL
  Filled 2021-12-19 (×2): qty 1

## 2021-12-19 MED ORDER — MORPHINE SULFATE ER 15 MG PO TBCR
15.0000 mg | EXTENDED_RELEASE_TABLET | Freq: Every morning | ORAL | Status: DC
  Administered 2021-12-20 – 2021-12-21 (×2): 15 mg via ORAL
  Filled 2021-12-19 (×2): qty 1

## 2021-12-19 MED ORDER — FAMOTIDINE 20 MG PO TABS
40.0000 mg | ORAL_TABLET | Freq: Every evening | ORAL | Status: DC
  Administered 2021-12-19 – 2021-12-20 (×2): 40 mg via ORAL
  Filled 2021-12-19 (×2): qty 2

## 2021-12-19 MED ORDER — STROKE: EARLY STAGES OF RECOVERY BOOK
Freq: Once | Status: AC
  Filled 2021-12-19: qty 1

## 2021-12-19 MED ORDER — ACETAMINOPHEN 500 MG PO TABS
1000.0000 mg | ORAL_TABLET | Freq: Once | ORAL | Status: AC
  Administered 2021-12-19: 1000 mg via ORAL

## 2021-12-19 MED ORDER — ONDANSETRON HCL 4 MG PO TABS: 4.0000 mg | ORAL_TABLET | Freq: Four times a day (QID) | ORAL | Status: DC | PRN

## 2021-12-19 MED ORDER — ACETAMINOPHEN 650 MG RE SUPP: 650.0000 mg | Freq: Four times a day (QID) | RECTAL | Status: DC | PRN

## 2021-12-19 MED ORDER — ACETAMINOPHEN 325 MG PO TABS
650.0000 mg | ORAL_TABLET | Freq: Four times a day (QID) | ORAL | Status: DC | PRN
  Filled 2021-12-19: qty 2

## 2021-12-19 MED ORDER — ADULT MULTIVITAMIN W/MINERALS CH
1.0000 | ORAL_TABLET | Freq: Every day | ORAL | Status: DC
  Administered 2021-12-20 – 2021-12-21 (×2): 1 via ORAL
  Filled 2021-12-19 (×2): qty 1

## 2021-12-19 MED ORDER — LORAZEPAM 2 MG/ML IJ SOLN
0.5000 mg | Freq: Once | INTRAMUSCULAR | Status: AC
Start: 2021-12-19 — End: 2021-12-19
  Administered 2021-12-19: 0.5 mg via INTRAVENOUS
  Filled 2021-12-19: qty 1

## 2021-12-19 MED ORDER — MORPHINE SULFATE ER 15 MG PO TBCR
30.0000 mg | EXTENDED_RELEASE_TABLET | Freq: Every day | ORAL | Status: DC
  Administered 2021-12-19 – 2021-12-20 (×2): 30 mg via ORAL
  Filled 2021-12-19 (×2): qty 2

## 2021-12-19 MED ORDER — CHLORHEXIDINE GLUCONATE CLOTH 2 % EX PADS
6.0000 | MEDICATED_PAD | Freq: Every day | CUTANEOUS | Status: DC
  Administered 2021-12-20 – 2021-12-21 (×2): 6 via TOPICAL

## 2021-12-19 MED ORDER — SODIUM CHLORIDE 0.9% FLUSH: 10.0000 mL | INTRAVENOUS | Status: DC | PRN

## 2021-12-19 MED ORDER — SODIUM CHLORIDE 0.9% FLUSH
10.0000 mL | Freq: Two times a day (BID) | INTRAVENOUS | Status: DC
  Administered 2021-12-19 – 2021-12-21 (×3): 10 mL

## 2021-12-19 MED ORDER — ONDANSETRON HCL 4 MG/2ML IJ SOLN: 4.0000 mg | Freq: Four times a day (QID) | INTRAMUSCULAR | Status: DC | PRN

## 2021-12-19 MED ORDER — MORPHINE SULFATE ER 15 MG PO TBCR: 15.0000 mg | EXTENDED_RELEASE_TABLET | ORAL | Status: DC

## 2021-12-19 MED ORDER — MIRTAZAPINE 15 MG PO TABS
30.0000 mg | ORAL_TABLET | Freq: Every day | ORAL | Status: DC
  Administered 2021-12-19 – 2021-12-20 (×2): 30 mg via ORAL
  Filled 2021-12-19 (×2): qty 2

## 2021-12-19 MED ORDER — DIAZEPAM 2 MG PO TABS
2.0000 mg | ORAL_TABLET | Freq: Two times a day (BID) | ORAL | Status: DC | PRN
  Administered 2021-12-19 – 2021-12-20 (×2): 2 mg via ORAL
  Filled 2021-12-19 (×2): qty 1

## 2021-12-19 MED ORDER — LORAZEPAM 2 MG/ML IJ SOLN: 1.0000 mg | Freq: Once | INTRAMUSCULAR | Status: DC | PRN

## 2021-12-19 MED ORDER — INTEGRA PLUS PO CAPS
1.0000 | ORAL_CAPSULE | Freq: Every day | ORAL | Status: DC
Start: 2021-12-20 — End: 2021-12-19

## 2021-12-19 NOTE — ED Provider Triage Note (Signed)
Emergency Medicine Provider Triage Evaluation Note  [name] Potts , a 51 y.o. 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  Pt complains of difficulty speaking that started suddenly 2 days ago.  Wife states that patient was having a conversation, and then suddenly he was having trouble forming his words.  Wife initially thought that this was because he was due for a paracentesis as he had too much fluid in his abdomen.  He had this done yesterday, however symptoms persisted.  She states they talked to Dr. Earlie Potts who advised him to come to the emergency department, however by the time they got home last night it was quite late, so they decided to come in today.  Patient denies new numbness, tingling, chest pain.  He has ongoing shortness of breath.     ROS Positive: As above Negative:   Physical Exam  BP 139/80 (BP Location: Left Arm)   Pulse (!) 105   Temp 97.8 F (36.6 C) (Oral)   Resp 14   Ht 6' (1.829 m)   Wt 73 kg   SpO2 99%   BMI 21.84 kg/m  Gen:   Awake, no distress   Resp:  Normal effort  MSK:   Moves extremities without difficulty  Other:  Patient is alert and oriented, able to engage in conversation with simple yes and no answers however seems to struggle with further articulation. CN III-XII intact Normal strength in upper and lower extremities bilaterally including dorsiflexion and plantar flexion, strong and equal grip strength  Moves extremities without ataxia, coordination intact Normal finger to nose and rapid alternating movements No pronator drift   Medical Decision Making  Medically screening exam initiated at 12:09 PM.  Appropriate orders placed.  [name] Potts was informed that the remainder of the evaluation will be completed by another provider, this initial triage assessment does not replace that evaluation, and the importance of remaining in the ED until their evaluation is  complete.     Tonye Pearson, [place] 12/19/21 1212

## 2021-12-19 NOTE — ED Triage Notes (Signed)
Pt has stage 4 lung cancer. Monday started to have trouble forming words, symptoms continue
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
## 2021-12-19 NOTE — ED Notes (Signed)
Wife, [phone]
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
## 2021-12-20 ENCOUNTER — Inpatient Hospital Stay (HOSPITAL_COMMUNITY): Payer: Commercial Managed Care - PPO

## 2021-12-20 DIAGNOSIS — Z86718 Personal history of other venous thrombosis and embolism: Secondary | ICD-10-CM | POA: Diagnosis not present

## 2021-12-20 DIAGNOSIS — C3491 Malignant neoplasm of unspecified part of right bronchus or lung: Secondary | ICD-10-CM

## 2021-12-20 DIAGNOSIS — I639 Cerebral infarction, unspecified: Secondary | ICD-10-CM | POA: Diagnosis not present

## 2021-12-20 DIAGNOSIS — D696 Thrombocytopenia, unspecified: Secondary | ICD-10-CM

## 2021-12-20 DIAGNOSIS — R180 Malignant ascites: Secondary | ICD-10-CM

## 2021-12-20 DIAGNOSIS — I63412 Cerebral infarction due to embolism of left middle cerebral artery: Secondary | ICD-10-CM

## 2021-12-20 LAB — COMPREHENSIVE METABOLIC PANEL
ALT: 20 U/L (ref 0–44)
AST: 32 U/L (ref 15–41)
Albumin: 2.2 g/dL — ABNORMAL LOW (ref 3.5–5.0)
Alkaline Phosphatase: 124 U/L (ref 38–126)
Anion gap: 9 (ref 5–15)
BUN: 27 mg/dL — ABNORMAL HIGH (ref 6–20)
CO2: 27 mmol/L (ref 22–32)
Calcium: 8.8 mg/dL — ABNORMAL LOW (ref 8.9–10.3)
Chloride: 101 mmol/L (ref 98–111)
Creatinine, Ser: 1.41 mg/dL — ABNORMAL HIGH (ref 0.61–1.24)
GFR, Estimated: 60 mL/min — ABNORMAL LOW (ref >60)
Glucose, Bld: 81 mg/dL (ref 70–99)
Potassium: 3.6 mmol/L (ref 3.5–5.1)
Sodium: 137 mmol/L (ref 135–145)
Total Bilirubin: 0.3 mg/dL (ref 0.3–1.2)
Total Protein: 4.8 g/dL — ABNORMAL LOW (ref 6.5–8.1)

## 2021-12-20 LAB — CBC
HCT: 22 % — ABNORMAL LOW (ref 39.0–52.0)
Hemoglobin: 6.9 g/dL — CL (ref 13.0–17.0)
MCH: 31.2 pg (ref 26.0–34.0)
MCHC: 31.4 g/dL (ref 30.0–36.0)
MCV: 99.5 fL (ref 80.0–100.0)
Platelets: 69 10*3/uL — ABNORMAL LOW (ref 150–400)
RBC: 2.21 MIL/uL — ABNORMAL LOW (ref 4.22–5.81)
RDW: 19.1 % — ABNORMAL HIGH (ref 11.5–15.5)
WBC: 13.9 10*3/uL — ABNORMAL HIGH (ref 4.0–10.5)
nRBC: 1.1 % — ABNORMAL HIGH (ref 0.0–0.2)

## 2021-12-20 LAB — LIPID PANEL
Cholesterol: 230 mg/dL — ABNORMAL HIGH (ref 0–200)
HDL: 48 mg/dL (ref >40)
LDL Cholesterol: 147 mg/dL — ABNORMAL HIGH (ref 0–99)
Total CHOL/HDL Ratio: 4.8 RATIO
Triglycerides: 176 mg/dL — ABNORMAL HIGH (ref <150)
VLDL: 35 mg/dL (ref 0–40)

## 2021-12-20 LAB — HEMOGLOBIN A1C
Hgb A1c MFr Bld: 4.5 % — ABNORMAL LOW (ref 4.8–5.6)
Mean Plasma Glucose: 82.45 mg/dL

## 2021-12-20 LAB — MAGNESIUM: Magnesium: 2.2 mg/dL (ref 1.7–2.4)

## 2021-12-20 LAB — PREPARE RBC (CROSSMATCH)

## 2021-12-20 MED ORDER — ROSUVASTATIN CALCIUM 20 MG PO TABS
20.0000 mg | ORAL_TABLET | Freq: Every day | ORAL | Status: DC
  Administered 2021-12-20 – 2021-12-21 (×2): 20 mg via ORAL
  Filled 2021-12-20 (×2): qty 1

## 2021-12-20 MED ORDER — SODIUM CHLORIDE 0.9 % IV SOLN: INTRAVENOUS | Status: DC

## 2021-12-20 MED ORDER — SODIUM CHLORIDE 0.9% IV SOLUTION: Freq: Once | INTRAVENOUS | Status: AC

## 2021-12-20 MED ORDER — SODIUM CHLORIDE 0.9 % IV BOLUS
250.0000 mL | Freq: Once | INTRAVENOUS | Status: AC
  Administered 2021-12-20: 250 mL via INTRAVENOUS

## 2021-12-20 NOTE — TOC Initial Note (Signed)
Transition of Care [facility]) - Initial/Assessment Note    Patient Details  Name: [name] Potts MRN: [id number] Date of Birth: [birth year]  Transition of Care [facility]) CM/SW Contact:    Pollie Friar, RN Phone Number: 12/20/2021, 1:10 PM  Clinical Narrative:                 Pt is from home with his spouse. She provides the assistance he needs and transportation. She also over sees his medications. Recommendations for outpatient therapy. Pt and  his wife are interested in [place] for outpatient rehab as this is closer to their home. Orders in epic and information on the AVS. CM will order bedside commode for home closer to time of d/c.  Pt has transport home when medically ready.  Expected Discharge Plan: OP Rehab Barriers to Discharge: Continued Medical Work up   Patient Goals and CMS Choice   CMS Medicare.gov Compare Post Acute Care list provided to:: Patient Choice offered to / list presented to : Patient, Spouse  Expected Discharge Plan and Services Expected Discharge Plan: OP Rehab   Discharge Planning Services: CM Consult   Living arrangements for the past 2 months: Single Family Home                 DME Arranged: 3-N-1                    Prior Living Arrangements/Services Living arrangements for the past 2 months: Single Family Home Lives with:: Spouse Patient language and need for interpreter reviewed:: Yes Do you feel safe going back to the place where you live?: Yes      Need for Family Participation in Patient Care: Yes (Comment) Care giver support system in place?: Yes (comment) Current home services: DME (4 prong cane) Criminal Activity/Legal Involvement Pertinent to Current Situation/Hospitalization: No - Comment as needed  Activities of Daily Living Home Assistive Devices/Equipment: Cane (specify quad or straight) ADL Screening (condition at time of admission) Patient's cognitive ability adequate to safely complete daily activities?: Yes Is the  patient deaf or have difficulty hearing?: No Does the patient have difficulty seeing, even when wearing glasses/contacts?: No Does the patient have difficulty concentrating, remembering, or making decisions?: Yes Patient able to express need for assistance with ADLs?: Yes Does the patient have difficulty dressing or bathing?: Yes Independently performs ADLs?: No Communication: Independent Dressing (OT): Needs assistance Is this a change from baseline?: Pre-admission baseline Grooming: Needs assistance Is this a change from baseline?: Pre-admission baseline Feeding: Independent Bathing: Needs assistance Is this a change from baseline?: Pre-admission baseline Toileting: Needs assistance Is this a change from baseline?: Pre-admission baseline Walks in Home: Needs assistance Is this a change from baseline?: Pre-admission baseline Does the patient have difficulty walking or climbing stairs?: Yes Weakness of Legs: Both Weakness of Arms/Hands: None  Permission Sought/Granted                  Emotional Assessment Appearance:: Appears older than stated age Attitude/Demeanor/Rapport: Lethargic Affect (typically observed): Accepting Orientation: : Oriented to Self, Oriented to Place, Oriented to  Time, Oriented to Situation   Psych Involvement: No (comment)  Admission diagnosis:  Stroke [facility]) [I63.9] Dysarthria [R47.1] Patient Active Problem List   Diagnosis Date Noted   Stroke ([place]) 12/19/2021   Leukocytosis 12/19/2021   Palliative care patient 11/02/2021   Acute respiratory failure with hypoxia (HCC) 10/31/2021   Malignant ascites 10/30/2021   Rectal bleed 10/29/2021   History of DVT (deep  vein thrombosis) 10/29/2021   Thrombocytopenia ([place]) 10/29/2021   Anxiety 08/22/2021   Elevated serum creatinine 08/22/2021   Anemia due to antineoplastic chemotherapy 08/22/2021   Port-A-Cath in place 08/06/2021   Antineoplastic chemotherapy induced pancytopenia (CODE) ([place]) 08/06/2021    Nausea    Dehydration with hyponatremia 08/05/2021   Acute deep vein thrombosis (DVT) of right lower extremity ([place]) 08/05/2021   Adenocarcinoma of right lung, stage 4 ([place]) 07/23/2021   Encounter for antineoplastic chemotherapy 07/23/2021   Goals of care, counseling/discussion 07/23/2021   Cancer associated pain 07/23/2021   Hydropneumothorax 06/27/2021   Carpal tunnel syndrome 06/21/2021   Cervical radiculopathy 06/21/2021   Neck pain 06/21/2021   Numbness of upper limb 06/21/2021   Pleural effusion on right 05/30/2021   Strain of muscle and tendon of back wall of thorax, initial encounter 11/22/2020   Erectile dysfunction 11/22/2020   COVID-19 virus infection 10/26/2020   Mixed hyperlipidemia 04/06/2020   Liver function study, abnormal 04/06/2020   Seasonal allergies 04/06/2020   BPH (benign prostatic hyperplasia) 04/06/2020   Right arm pain 04/06/2020   ADHD 04/06/2020   Family history of colon cancer 10/01/2011   PCP:  [name], MD Pharmacy:   [facility] [address], [place] - [address] [place] [address] Phone: [phone] Fax: [phone]  [place], [place] [address] Phone: [phone] Fax: [phone]     Social Determinants of Health (SDOH) Interventions    Readmission Risk Interventions    11/02/2021    9:48 AM  Readmission Risk Prevention Plan  Transportation Screening Complete  PCP or Specialist Appt within 3-5 Days Complete  HRI or [place] Complete  Social Work Consult for [place] Planning/Counseling Complete  Palliative Care Screening Complete  Medication Review [employer]) Complete

## 2021-12-20 NOTE — Evaluation (Signed)
Physical Therapy Evaluation Patient Details Name: [name] Potts MRN: [id number] DOB: [birth year] Today's Date: 12/20/2021  History of Present Illness  Pt is as 51 y/o male presenting on 9/6 with word finding difficulty and R UE weakess for 2 days. Unable to complete MRI but CT shows L insular infarct and CT angio L M2 stenosis vs occlusion. Noted pt currently on chemotherapy for stage IV non small cell lung cancer with possible recurrent malignant ascities (paracentesis on 9/5 and 8/22). PMH includeS: ADHD, PNA,  Clinical Impression   Pt presents with generalized weakness, impaired sitting and standing balance, impaired attention and awareness with possible L inattention during gait, and decreased activity tolerance. Pt to benefit from acute PT to address deficits. Pt ambulated hallway distance, initially with cane but pt unsafe with cane use so transitioned to no AD and CGA. Pt maintained SPO2 91% and greater on RA, but does demonstrate DOE. Pt's wife takes pt to appointments, recommending OPPT to address balance and strength deficits. PT to progress mobility as tolerated, and will continue to follow acutely.         Recommendations for follow up therapy are one component of a multi-disciplinary discharge planning process, led by the attending physician.  Recommendations may be updated based on patient status, additional functional criteria and insurance authorization.  Follow Up Recommendations Outpatient PT      Assistance Recommended at Discharge Set up Supervision/Assistance  Patient can return home with the following  A little help with walking and/or transfers;A little help with bathing/dressing/bathroom;Assist for transportation;Help with stairs or ramp for entrance    Equipment Recommendations None recommended by PT  Recommendations for Other Services       Functional Status Assessment Patient has had a recent decline in their functional status and demonstrates the ability to make  significant improvements in function in a reasonable and predictable amount of time.     Precautions / Restrictions Precautions Precautions: Fall Restrictions Weight Bearing Restrictions: No      Mobility  Bed Mobility               General bed mobility comments: up with OT    Transfers Overall transfer level: Needs assistance Equipment used: None Transfers: Sit to/from Stand Sit to Stand: Supervision           General transfer comment: for safety, cues to avoid bumping into IV pole as pt not very in tune to surroundings    Ambulation/Gait Ambulation/Gait assistance: Min guard Gait Distance (Feet): 300 Feet Assistive device: IV Pole, Straight cane, None Gait Pattern/deviations: Step-through pattern, Decreased stride length, Shuffle Gait velocity: decr     General Gait Details: Initially pt using IV pole, transitioned to cane as pt uses cane at home. More unsteady and poor sequencing with cane, transitioned to contact guard and no AD with improved gait. Cues for increasing foot clearance, stay in middle of hallway as pt veering L  Stairs            Wheelchair Mobility    Modified Rankin (Stroke Patients Only) Modified Rankin (Stroke Patients Only) Pre-Morbid Rankin Score: Slight disability Modified Rankin: Moderately severe disability     Balance Overall balance assessment: Needs assistance Sitting-balance support: No upper extremity supported, Feet supported Sitting balance-Leahy Scale: Fair Sitting balance - Comments: posterior leaning requiring contact guard with LE and UE movement Postural control: Posterior lean Standing balance support: No upper extremity supported, During functional activity Standing balance-Leahy Scale: Fair  Pertinent Vitals/Pain Pain Assessment Pain Assessment: No/denies pain    Home Living Family/patient expects to be discharged to:: Private residence Living Arrangements:  Spouse/significant other Available Help at Discharge: Family;Available 24 hours/day Type of Home: House Home Access: Stairs to enter Entrance Stairs-Rails: Can reach both Entrance Stairs-Number of Steps: 5-6   Home Layout: One level Home Equipment: Cane - quad      Prior Function Prior Level of Function : Independent/Modified Independent             Mobility Comments: using cane intermittently ADLs Comments: not driving or managing meds, but otherwise independent     Hand Dominance   Dominant Hand: Right    Extremity/Trunk Assessment   Upper Extremity Assessment Upper Extremity Assessment: Defer to OT evaluation    Lower Extremity Assessment Lower Extremity Assessment: Generalized weakness (4/5 throughout, functionally similar bilat)    Cervical / Trunk Assessment Cervical / Trunk Assessment: Kyphotic  Communication   Communication: Expressive difficulties  Cognition Arousal/Alertness: Awake/alert Behavior During Therapy: Flat affect Overall Cognitive Status: Impaired/Different from baseline Area of Impairment: Attention, Following commands, Safety/judgement, Problem solving                   Current Attention Level: Sustained   Following Commands: Follows one step commands with increased time, Follows multi-step commands consistently Safety/Judgement: Decreased awareness of safety, Decreased awareness of deficits   Problem Solving: Slow processing, Difficulty sequencing, Requires verbal cues, Requires tactile cues General Comments: Pt responds best and most consistently to yes/no questions, thought to be due to both cognition and expressive difficulty. Pt struggles with multistep command following, at times requires multimodal cuing to follow MMT testing parameters and hallway navigation. Pt listing L during gait, no awareness of this and has to be prompted by PT to avoid running into someone. Pt's wife interjects during 106 of subjective questioning,  unsure if this is baseline compensation for pt or new since expressive difficulties.        General Comments General comments (skin integrity, edema, etc.): DOE 2/4 during gait with SPO2 91%, recovered to 97% and DOE 1/4 with standing rest. MaxHR observed 130 bpm during gait    Exercises     Assessment/Plan    PT Assessment Patient needs continued PT services  PT Problem List Decreased strength;Decreased mobility;Decreased balance;Decreased knowledge of use of DME;Decreased safety awareness;Decreased activity tolerance;Decreased cognition       PT Treatment Interventions DME instruction;Therapeutic activities;Gait training;Therapeutic exercise;Patient/family education;Balance training;Stair training;Functional mobility training;Neuromuscular re-education    PT Goals (Current goals can be found in the Care Plan section)  Acute Rehab PT Goals Patient Stated Goal: home PT Goal Formulation: With patient/family Time For Goal Achievement: 01/03/22 Potential to Achieve Goals: Good    Frequency Min 4X/week     Co-evaluation               AM-PAC PT "6 Clicks" Mobility  Outcome Measure Help needed turning from your back to your side while in a flat bed without using bedrails?: A Little Help needed moving from lying on your back to sitting on the side of a flat bed without using bedrails?: A Little Help needed moving to and from a bed to a chair (including a wheelchair)?: A Little Help needed standing up from a chair using your arms (e.g., wheelchair or bedside chair)?: A Little Help needed to walk in hospital room?: A Little Help needed climbing 3-5 steps with a railing? : A Lot 6 Click Score: 17  End of Session   Activity Tolerance: Patient tolerated treatment well;Patient limited by fatigue Patient left: in chair;with call bell/phone within reach;with chair alarm set;with family/visitor present Nurse Communication: Mobility status PT Visit Diagnosis: Other abnormalities  of gait and mobility (R26.89);Muscle weakness (generalized) (M62.81)    Time: 0950-1005 PT Time Calculation (min) (ACUTE ONLY): 15 min   Charges:   PT Evaluation $PT Eval Low Complexity: 1 Low         Amritha Yorke S, PT DPT Acute Rehabilitation Services Pager [phone]  Office [phone]   Garland E Stroup 12/20/2021, 11:22 AM

## 2021-12-20 NOTE — Progress Notes (Signed)
IV team consult for pt. Self removing port needle. Pt. Is confused and looking for his wife. He was receiving fluids KVO. Spoke with RN. We are delaying reaccess at this time until patient is in the bed and more restful.

## 2021-12-20 NOTE — Progress Notes (Signed)
Occupational Therapy Treatment Patient Details Name: [name] Potts MRN: [id number] DOB: 1970/11/14 Today's Date: 12/20/2021   History of present illness Pt is as 51 y/o male presenting on 9/6 with word finding difficulty and R UE weakess for 2 days. Unable to complete MRI but CT shows L insular infarct and CT angio L M2 stenosis vs occlusion. Noted pt currently on chemotherapy for stage IV non small cell lung cancer with possible recurrent malignant ascities (paracentesis on 9/5 and 8/22). PMH includeS: ADHD, PNA,   OT comments  PTA patient independent, spouse assists with driving and IADLs.  Admitted for above and limited by problem list below, including impaired cognition, expressive deficits, generalized weakness, decreased activity tolerance and impaired balance.  Pt currently requires up to min guard for ADLs, min guard for transfers and mobility in room.  Noted some L inattention with transfers (and PT noticed this as well with mobility in hallway). He is able to follow 1 step commands with increased time, but difficulty with multiple step commands, attention, sequencing, and problem solving-- continue cognitive assessment.  Believe pt will best benefit from continued OT services acutely and after dc at neuro outpatient OT level to optimize independence, safety and return to PLOF. Will follow.    Recommendations for follow up therapy are one component of a multi-disciplinary discharge planning process, led by the attending physician.  Recommendations may be updated based on patient status, additional functional criteria and insurance authorization.    Follow Up Recommendations  Outpatient OT    Assistance Recommended at Discharge Frequent or constant Supervision/Assistance  Patient can return home with the following  A little help with walking and/or transfers;A little help with bathing/dressing/bathroom;Assistance with cooking/housework;Direct supervision/assist for medications  management;Direct supervision/assist for financial management;Assist for transportation;Help with stairs or ramp for entrance   Equipment Recommendations  BSC/3in1    Recommendations for Other Services Speech consult    Precautions / Restrictions Precautions Precautions: Fall Restrictions Weight Bearing Restrictions: No       Mobility Bed Mobility Overal bed mobility: Needs Assistance Bed Mobility: Supine to Sit     Supine to sit: Min guard     General bed mobility comments: for safety    Transfers Overall transfer level: Needs assistance Equipment used: None Transfers: Sit to/from Stand Sit to Stand: Supervision           General transfer comment: for safety, cues to avoid bumping into IV pole (on L side) as pt not very in tune to surroundings     Balance Overall balance assessment: Needs assistance Sitting-balance support: No upper extremity supported, Feet supported Sitting balance-Leahy Scale: Fair Sitting balance - Comments: posterior leaning requiring contact guard with LE and UE movement Postural control: Posterior lean Standing balance support: No upper extremity supported, During functional activity Standing balance-Leahy Scale: Fair                             ADL either performed or assessed with clinical judgement   ADL Overall ADL's : Needs assistance/impaired     Grooming: Min guard;Standing           Upper Body Dressing : Minimal assistance;Sitting   Lower Body Dressing: Min guard;Sit to/from stand   Toilet Transfer: Min guard;Ambulation           Functional mobility during ADLs: Min guard;Cueing for safety General ADL Comments: min guard for safety, mild unsteadiness    Extremity/Trunk Assessment Upper Extremity Assessment  Upper Extremity Assessment: Generalized weakness (apperas 3+/5 MMT both UEs, difficulty with [facility] but antiicpate this is more cognition vs coordination)   Lower Extremity Assessment Lower  Extremity Assessment: Defer to PT evaluation   Cervical / Trunk Assessment Cervical / Trunk Assessment: Kyphotic    Vision Baseline Vision/History: 1 Wears glasses Ability to See in Adequate Light: 0 Adequate (blurry but glasses are broken) Patient Visual Report: No change from baseline (reports progressive decline) Additional Comments: simple assessment- able to locate # of fingers in all quadrants and locate needs at sink with min cueing.  continue assessment   Perception Perception Comments: L enviormental inattetion?   Praxis      Cognition Arousal/Alertness: Awake/alert Behavior During Therapy: Flat affect Overall Cognitive Status: Impaired/Different from baseline Area of Impairment: Attention, Following commands, Safety/judgement, Problem solving, Awareness                   Current Attention Level: Sustained   Following Commands: Follows one step commands with increased time, Follows multi-step commands consistently Safety/Judgement: Decreased awareness of safety, Decreased awareness of deficits Awareness: Emergent Problem Solving: Slow processing, Difficulty sequencing, Requires verbal cues, Requires tactile cues General Comments: pt responds best to yes/no questions, due to cognition and expressive deficits.  Decreased attention, safety awareness and problem solving.  When asked "what time is it?", pt requires cueing to locate clock and then able to tell therapist the time. Difficulty following multiple step commands. Pts spouse reports pt is at baseline, but limited by ability to express himself-- question baseline.  Spouse interjects during majority of session.        Exercises      Shoulder Instructions       General Comments DOE 2/4 during gait with SPO2 91%, recovered to 97% and DOE 1/4 with standing rest. MaxHR observed 130 bpm during gait    Pertinent Vitals/ Pain       Pain Assessment Pain Assessment: No/denies pain  Home Living Family/patient  expects to be discharged to:: Private residence Living Arrangements: Spouse/significant other Available Help at Discharge: Family;Available 24 hours/day Type of Home: House Home Access: Stairs to enter [employer] of Steps: 5-6 Entrance Stairs-Rails: Can reach both Home Layout: One level     Bathroom Shower/Tub: Occupational psychologist: Handicapped height     Home Equipment: Radio producer - quad          Prior Functioning/Environment              Frequency  Min 2X/week        Progress Toward Goals  OT Goals(current goals can now be found in the care plan section)     Acute Rehab OT Goals Patient Stated Goal: home OT Goal Formulation: With patient Time For Goal Achievement: 01/03/22 Potential to Achieve Goals: Good  Plan      Co-evaluation                 AM-PAC OT "6 Clicks" Daily Activity     Outcome Measure   Help from another person eating meals?: A Little Help from another person taking care of personal grooming?: A Little Help from another person toileting, which includes using toliet, bedpan, or urinal?: A Little Help from another person bathing (including washing, rinsing, drying)?: A Little Help from another person to put on and taking off regular upper body clothing?: A Little Help from another person to put on and taking off regular lower body clothing?: A Little 6 Click Score: 18  End of Session Equipment Utilized During Treatment: Gait belt  OT Visit Diagnosis: Other abnormalities of gait and mobility (R26.89);Muscle weakness (generalized) (M62.81);Cognitive communication deficit (R41.841);Other symptoms and signs involving cognitive function   Activity Tolerance Patient tolerated treatment well   Patient Left in chair;with call bell/phone within reach;with chair alarm set;with family/visitor present   Nurse Communication Mobility status        Time: 0950-1005 OT Time Calculation (min): 15 min  Charges: OT  General Charges $OT Visit: 1 Visit OT Evaluation $OT Eval Moderate Complexity: [place], OT Acute Rehabilitation Services Office [phone]   Delight Stare 12/20/2021, 11:41 AM
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
## 2021-12-20 NOTE — Consult Note (Addendum)
NEURO HOSPITALIST CONSULT NOTE   Requesting physician: Dr. 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 The patient was also able to communicate to the ED physician that he had right sided weakness. CT head at the [facility] revealed a left insular infarction. CTA revealed left M2 severe stenosis versus occlusion with distal flow. He was out of the TNK and endovascular time windows. He does have a metallic fragment (BB) that precludes MRI.   EDP note has been reviewed: "[name] Potts is a 51 y.o. male w/ hx of metastatic lung cancer not currently on treatment. For the past 2 days he has had constant, slowed, stuttering speech which is new. He was encouraged to come for eval in the ED yesterday but waited until today. He has feels subjective R sided weakness. He also has generalized weakness. He has been using motorized wheelchair at the grocery store because that he is too weak to walk the entire grocery store this is also progressively worsening over time and not acute.  He has had increased exertional and dyspnea at rest over time as well.  He is not on oxygen at home."   Past Medical History:  Diagnosis Date   ADHD (attention deficit hyperactivity disorder)    Chronic back pain    Complication of anesthesia    woke up during 1 of 3 colonoscopies   Constipation     Dyspnea    Pneumonia    Seasonal allergies     Past Surgical History:  Procedure Laterality Date   COLONOSCOPY  10/11/2011   Procedure: COLONOSCOPY;  Surgeon: Daneil Dolin, MD;  [facility]: [facility];  Service: Endoscopy;  Laterality: N/A;  1:45 PM   COLONOSCOPY N/A 01/23/2018   Procedure: COLONOSCOPY;  Surgeon: Daneil Dolin, MD;  Location: [facility];  Service: Endoscopy;  Laterality: N/A;  1:00   IR IMAGING GUIDED PORT INSERTION  07/27/2021   IR PARACENTESIS  11/27/2021   NECK SURGERY     Apr 05, 2021- per pt report cervical surgery at day surgery center   POLYPECTOMY  01/23/2018   Procedure: POLYPECTOMY;  Surgeon: Manus Rudd  M, MD;  Location: [facility];  Service: Endoscopy;;   VIDEO ASSISTED THORACOSCOPY (VATS)/DECORTICATION Right 06/27/2021   Procedure: VIDEO ASSISTED THORACOSCOPY (VATS), Drainage of RIGHT pleural effusion/ DECORTICATION;  Surgeon: Melrose Nakayama, MD;  Location: [facility] OR;  Service: Thoracic;  Laterality: Right;    Family History  Problem Relation Age of Onset   Colon cancer Mother 37       passed at age 30   Stroke Father           Social History:  reports that he quit smoking about 9 months ago. His smoking use included cigarettes. He smoked an average of .5 packs per day. His smokeless tobacco use includes snuff. He reports that he does not currently use alcohol. He reports that he does not currently use drugs after having used the following drugs: Marijuana.  No Known Allergies  MEDICATIONS:                                                                                                                     Prior to Admission:  Medications Prior to Admission  Medication Sig Dispense Refill Last Dose   amphetamine-dextroamphetamine (ADDERALL XR) 15 MG 24 hr capsule Take 1 capsule by mouth daily. 30 capsule 0 12/19/2021   apixaban (ELIQUIS) 5 MG TABS tablet Take 1 tablet (5 mg total) by mouth 2 (two) times daily. Restart 7/26 if no further  bleeding 60 tablet 1 12/19/2021 at 0730   cetirizine (ZYRTEC) 10 MG tablet Take 10 mg by mouth in the morning.   12/19/2021   dexamethasone (DECADRON) 4 MG tablet Take 1 tablet (4 mg total) by mouth daily. 30 tablet 3 12/19/2021   diazepam (VALIUM) 2 MG tablet Take 1 tablet (2 mg total) by mouth every 12 (twelve) hours as needed for anxiety. 60 tablet 0 12/19/2021   dronabinol (MARINOL) 2.5 MG capsule Take 1 capsule (2.5 mg total) by mouth 2 (two) times daily before a meal. 60 capsule 0 12/14/2021   famotidine (PEPCID) 40 MG tablet Take 1 tablet (40 mg total) by mouth every evening. 30 tablet 1 12/18/2021   FeFum-FePoly-FA-B Cmp-C-Biot (INTEGRA PLUS) CAPS Take 1 capsule by mouth once daily 30 capsule 0 0/04/6008   folic acid (FOLVITE) 1 MG tablet Take 1 tablet (1 mg total) by mouth daily. 30 tablet 4 12/19/2021   lidocaine-prilocaine (EMLA) cream Apply to the Port-A-Cath site 30 minutes before chemotherapy (Patient taking differently: Apply 1 Application topically See admin instructions. Apply to the Port-A-Cath site 30 minutes before chemotherapy) 30 g 0 10/22/2021   magnesium oxide (MAG-OX) 400 (240 Mg) MG tablet Take 1 tablet (400 mg total) by mouth daily. 30 tablet 3 12/18/2021   mirtazapine (REMERON) 30 MG tablet TAKE 1 TABLET BY MOUTH AT BEDTIME 30 tablet 0 12/18/2021   morphine (MS CONTIN) 30 MG 12 hr tablet Take 1 tablet (30 mg total) by mouth every 12 (twelve) hours. (Patient taking differently: Take 15-30 mg by mouth See admin instructions. Take 1 tablet (  15 mg) in the morning and take 2 tablets (30 mg) at bedtime) 60 tablet 0 12/18/2021   OVER THE COUNTER MEDICATION Take 2 capsules by mouth in the morning and at bedtime. Nature's Health Self Heal Prunella Vulgaris 10:1 Extract (500 mg)   12/18/2021   oxyCODONE (OXY IR/ROXICODONE) 5 MG immediate release tablet Take 1 tablet (5 mg total) by mouth every 6 (six) hours as needed for moderate pain or severe pain. (Patient taking differently: Take 5 mg by mouth every 6  (six) hours as needed (for breakthrough pain).) 60 tablet 0 12/18/2021   polyethylene glycol (MIRALAX / GLYCOLAX) 17 g packet Take 17 g by mouth daily as needed for mild constipation. 14 each 0 12/18/2021   prochlorperazine (COMPAZINE) 10 MG tablet TAKE 1 TABLET BY MOUTH EVERY 6 HOURS AS NEEDED FOR NAUSEA FOR VOMITING (Patient taking differently: Take 10 mg by mouth every 6 (six) hours as needed for nausea or vomiting.) 30 tablet 2 Past Week   Scheduled:   stroke: early stages of recovery book   Does not apply Once   apixaban  5 mg Oral BID   Chlorhexidine Gluconate Cloth  6 each Topical Daily   dexamethasone  4 mg Oral Daily   famotidine  40 mg Oral QPM   feeding supplement  237 mL Oral BID BM   folic acid  1 mg Oral Daily   magnesium oxide  400 mg Oral Daily   mirtazapine  30 mg Oral QHS   morphine  15 mg Oral q morning   And   morphine  30 mg Oral QHS   multivitamin with minerals  1 tablet Oral Daily   sodium chloride flush  10-40 mL Intracatheter Q12H     ROS:                                                                                                                                       Unable to obtain due to aphasia.    Blood pressure 114/69, pulse 96, temperature (!) 97.4 F (36.3 C), temperature source Oral, resp. rate 14, height 6' (1.829 m), weight 73 kg, SpO2 96 %.   General Examination:                                                                                                       Physical Exam  HEENT-  [place]/AT   Lungs- Respirations unlabored Extremities- No edema   Neurological Examination Mental Status: Awake and alert. Pleasant  and attempts to cooperate. Able to follow verbal commands but testing of speech output reveals patient to be nearly mute when asked several questions, murmuring softly and nodding his head in response but without expressing any recognizable words. However, when examiner wished him a good night on leaving, the patient gave a brief,  clear response "you have a good night too". No hemineglect.  Cranial Nerves: II: Blinks to threat bilaterally. PERRL  III,IV, VI: No ptosis. EOMI with saccadic visual pursuits when tracking to the left and right. No nystagmus.   V: Reacts to FT bilaterally.   VII: Smile symmetric VIII: Hearing intact to voice IX,X: No hoarseness XI: Symmetric shoulder shrug XII: Midline tongue extension Motor: BUE 5/5 proximally and distally BLE 5/5 proximally and distally  No pronator drift.  Sensory: Reacts to FT x 4 without gross asymmetry.  Deep Tendon Reflexes: 2+ and symmetric throughout Cerebellar: No ataxia with FNF bilaterally  Gait: Deferred   NIHSS: 2  Lab Results: Basic Metabolic Panel: Recent Labs  Lab 12/19/21 1207  NA 137  K 3.9  CL 103  CO2 23  GLUCOSE 113*  BUN 25*  CREATININE 1.38*  CALCIUM 8.6*    CBC: Recent Labs  Lab 12/19/21 1207  WBC 17.6*  NEUTROABS 15.4*  HGB 8.3*  HCT 27.4*  MCV 102.2*  PLT 89*    Cardiac Enzymes: No results for input(s): "CKTOTAL", "CKMB", "CKMBINDEX", "TROPONINI" in the last 168 hours.  Lipid Panel: No results for input(s): "CHOL", "TRIG", "HDL", "CHOLHDL", "VLDL", "LDLCALC" in the last 168 hours.  Imaging: CT ANGIO HEAD NECK W WO CM  Result Date: 12/19/2021 CLINICAL DATA:  Abnormal speech beginning 2 days ago. Patient undergoing therapy for lung cancer. Known pleural effusions and ascites. EXAM: CT ANGIOGRAPHY HEAD AND NECK TECHNIQUE: Multidetector CT imaging of the head and neck was performed using the standard protocol during bolus administration of intravenous contrast. Multiplanar CT image reconstructions and MIPs were obtained to evaluate the vascular anatomy. Carotid stenosis measurements (when applicable) are obtained utilizing NASCET criteria, using the distal internal carotid diameter as the denominator. RADIATION DOSE REDUCTION: This exam was performed according to the departmental dose-optimization program which includes  automated exposure control, adjustment of the mA and/or kV according to patient size and/or use of iterative reconstruction technique. CONTRAST:  24mL OMNIPAQUE IOHEXOL 350 MG/ML SOLN COMPARISON:  CT head without contrast 12/19/2021 and 10/10/2021. FINDINGS: CT HEAD FINDINGS Brain: Focal loss of gray-white differentiation is noted in the anterior left insular ribbon. Cortex is otherwise intact. Mild atrophy and white matter changes are present. Basal ganglia are intact. The ventricles are of normal size. No significant extraaxial fluid collection is present. The brainstem and cerebellum are within normal limits. Vascular: No hyperdense vessel or unexpected calcification. Skull: Calvarium is intact. No focal lytic or blastic lesions are present. No significant extracranial soft tissue lesion is present. Sinuses/Orbits: The paranasal sinuses and mastoid air cells are clear. The globes and orbits are within normal limits. Review of the MIP images confirms the above findings CTA NECK FINDINGS Aortic arch: 3 vessel arch configuration present. No significant stenosis is present the great vessel origins. Right carotid system: Right common carotid artery is within normal limits. Minimal atherosclerotic changes are present. No significant stenosis is present. Cervical ICA is within normal limits. Left carotid system: The left common carotid artery is within normal limits. Minimal atherosclerotic calcifications are present. No significant stenosis is present. The cervical left ICA is within normal limits. Vertebral arteries: The right vertebral  artery is the dominant vessel. Both vertebral arteries originate from the subclavian arteries without significant stenosis. No significant stenosis is present in either vertebral artery in the neck. Skeleton: ACDF noted at C5-6 and C6-7. Mild degenerative changes are noted above this level. No focal osseous lesions are present. Other neck: Soft tissues the neck are otherwise  unremarkable. Salivary glands are within normal limits. Thyroid is normal. No significant adenopathy is present. No focal mucosal or submucosal lesions are present. Left IJ Port-A-Cath is in place. Upper chest: Diffuse pleural thickening again noted on the right. A moderate left pleural effusion is present. Paraseptal emphysematous changes are noted at both lung apices. Review of the MIP images confirms the above findings CTA HEAD FINDINGS Anterior circulation: The internal carotid arteries are within normal limits to the ICA termini bilaterally. The right A1 segment is dominant. The anterior communicating artery is patent. Both ACA vessels fill and are within normal limits. The right MCA bifurcation and branch vessels are normal. A high-grade stenosis or partial occlusion is present in the anterior left M2 branch. The more distal vessel does fill. This corresponds with the insular cortex infarct. Posterior circulation: The right vertebral artery is dominant vessel. PICA origins are visualized and normal. Vertebrobasilar junction the basilar artery are normal. The right posterior cerebral artery originates from basilar tip. The left posterior cerebral artery is of fetal type. The PCA branch vessels are within normal limits bilaterally. Venous sinuses: The dural sinuses are patent. The straight sinus deep cerebral veins are intact. Cortical veins are within normal limits. No significant vascular malformation is evident. Anatomic variants: Fetal type left posterior cerebral artery. Review of the MIP images confirms the above findings IMPRESSION: 1. High-grade stenosis or partial occlusion of the anterior left M2 branch. The more distal vessel does fill. This corresponds with the insular cortex infarct. 2. No other significant proximal stenosis, aneurysm, or branch vessel occlusion within the Circle of Willis. 3. Minimal atherosclerotic changes at the carotid bifurcations bilaterally without significant stenosis. 4.  Diffuse pleural thickening on the right with a moderate left pleural effusion. 5. ACDF at C5-6 and C6-7. These results were called by telephone at the time of interpretation on 12/19/2021 at 5:00 pm to provider [facility] , who verbally acknowledged these results. Electronically Signed   By: San Morelle M.D.   On: 12/19/2021 17:01   CT Head Wo Contrast  Addendum Date: 12/19/2021   ADDENDUM REPORT: 12/19/2021 16:48 ADDENDUM: On further review, there is subtle hypoattenuation in the anterior aspect of the left insula which raises concern for acute left MCA territory infarct. Recommend MRI to further evaluate. This addendum was discussed with Dr. Rory Percy by Dr. Jobe Igo via telephone. Electronically Signed   By: Margaretha Sheffield M.D.   On: 12/19/2021 16:48   Result Date: 12/19/2021 CLINICAL DATA:  Neuro deficit, acute, stroke suspected EXAM: CT HEAD WITHOUT CONTRAST TECHNIQUE: Contiguous axial images were obtained from the base of the skull through the vertex without intravenous contrast. RADIATION DOSE REDUCTION: This exam was performed according to the departmental dose-optimization program which includes automated exposure control, adjustment of the mA and/or kV according to patient size and/or use of iterative reconstruction technique. COMPARISON:  CT head October 10, 2021. FINDINGS: Brain: No evidence of acute infarction, hemorrhage, hydrocephalus, extra-axial collection or mass lesion/mass effect. Patchy white matter hypodensities, nonspecific but compatible with chronic microvascular ischemic disease. Vascular: No hyperdense vessel identified. Skull: No acute fracture. Sinuses/Orbits: No acute findings. Other: No mastoid effusions. IMPRESSION: No evidence  of acute intracranial abnormality. Electronically Signed: By: Margaretha Sheffield M.D. On: 12/19/2021 12:58   US Paracentesis  Result Date: 12/18/2021 INDICATION: Patient with a history of lung cancer with recurrent malignant ascites. Interventional  radiology asked to perform a therapeutic paracentesis. EXAM: ULTRASOUND GUIDED PARACENTESIS MEDICATIONS: 1% lidocaine 15 mL COMPLICATIONS: None immediate. PROCEDURE: Informed written consent was obtained from the patient after a discussion of the risks, benefits and alternatives to treatment. A timeout was performed prior to the initiation of the procedure. Initial ultrasound scanning demonstrates a large amount of ascites within the left lower abdominal quadrant. The right lower abdomen was prepped and draped in the usual sterile fashion. 1% lidocaine was used for local anesthesia. Following this, a 19 gauge, 7-cm, Yueh catheter was introduced. An ultrasound image was saved for documentation purposes. The paracentesis was performed. The catheter was removed and a dressing was applied. The patient tolerated the procedure well without immediate post procedural complication. FINDINGS: A total of approximately 2 L of bloody fluid was removed. IMPRESSION: Successful ultrasound-guided paracentesis yielding 2 liters of peritoneal fluid. Read by: Soyla Dryer, NP Electronically Signed   By: Ruthann Cancer M.D.   On: 12/18/2021 13:32     Assessment: 51 y.o. 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 The patient was also able to communicate to the ED physician that he had right sided weakness. CT head at the [facility] revealed a left insular infarction. CTA revealed left M2 severe stenosis versus occlusion with distal flow. He was out of the TNK and endovascular time windows. He does have a metallic fragment (BB) that precludes MRI.  - Exam reveals expressive aphasia without  facial droop or limb weakness.   - CT head: There is subtle hypoattenuation in the anterior aspect of the left insula which raises concern for acute left MCA territory infarct - CTA of head and neck:  High-grade stenosis or partial occlusion of the anterior left M2 branch. The more distal vessel does fill. This corresponds with the insular cortex infarct. No other significant proximal stenosis, aneurysm, or branch vessel occlusion within the Circle of Willis. Minimal atherosclerotic changes at the carotid bifurcations bilaterally without significant stenosis.  - CBC with elevated white count of 17.6, predominantly neutrophilic. Thrombocytopenic with platelets of 89. Anemic with Hgb of 8.3.   Recommendations: - Continue apixaban. This is for his history of DVT but is the best option for secondary stroke prevention given that he is likely hypercoagulable from his cancer. No indication generally for addition of ASA if already anticoagulated. Additionally, he likely would have a significant risk of hemorrhage if ASA were to be added, given his thrombocytopenia.  - Cannot obtain MRI brain due to BB (metallic and may be ferromagnetic) - Repeat CT head in 2-3 days to assess for interval evolution of the ischemic infarction. At that time point the stroke likely will also be more well-delineated on CT.  - HgbA1c, fasting lipid panel - TTE - Cardiac telemetry - PT consult, OT consult, Speech consult - Frequent neuro checks - NPO until passes stroke swallow screen - Management of his stage 4 lung cancer per primary team   Electronically signed: Dr. Kerney Elbe 12/20/2021, 12:56 AM

## 2021-12-20 NOTE — Evaluation (Signed)
Speech Language Pathology Evaluation Patient Details Name: TEREL BANN MRN: [id number] DOB: 10/16/1970 Today's Date: 12/20/2021 Time: 1005-1040 SLP Time Calculation (min) (ACUTE ONLY): 35 min  Problem List:  Patient Active Problem List   Diagnosis Date Noted   Stroke ([place]) 12/19/2021   Leukocytosis 12/19/2021   Palliative care patient 11/02/2021   Acute respiratory failure with hypoxia (HCC) 10/31/2021   Malignant ascites 10/30/2021   Rectal bleed 10/29/2021   History of DVT (deep vein thrombosis) 10/29/2021   Thrombocytopenia ([place]) 10/29/2021   Anxiety 08/22/2021   Elevated serum creatinine 08/22/2021   Anemia due to antineoplastic chemotherapy 08/22/2021   Port-A-Cath in place 08/06/2021   Antineoplastic chemotherapy induced pancytopenia (CODE) ([place]) 08/06/2021   Nausea    Dehydration with hyponatremia 08/05/2021   Acute deep vein thrombosis (DVT) of right lower extremity ([place]) 08/05/2021   Adenocarcinoma of right lung, stage 4 ([place]) 07/23/2021   Encounter for antineoplastic chemotherapy 07/23/2021   Goals of care, counseling/discussion 07/23/2021   Cancer associated pain 07/23/2021   Hydropneumothorax 06/27/2021   Carpal tunnel syndrome 06/21/2021   Cervical radiculopathy 06/21/2021   Neck pain 06/21/2021   Numbness of upper limb 06/21/2021   Pleural effusion on right 05/30/2021   Strain of muscle and tendon of back wall of thorax, initial encounter 11/22/2020   Erectile dysfunction 11/22/2020   COVID-19 virus infection 10/26/2020   Mixed hyperlipidemia 04/06/2020   Liver function study, abnormal 04/06/2020   Seasonal allergies 04/06/2020   BPH (benign prostatic hyperplasia) 04/06/2020   Right arm pain 04/06/2020   ADHD 04/06/2020   Family history of colon cancer 10/01/2011   Past Medical History:  Past Medical History:  Diagnosis Date   ADHD (attention deficit hyperactivity disorder)    Chronic back pain    Complication of anesthesia    woke up during 1 of 3  colonoscopies   Constipation    Dyspnea    Pneumonia    Seasonal allergies    Past Surgical History:  Past Surgical History:  Procedure Laterality Date   COLONOSCOPY  10/11/2011   Procedure: COLONOSCOPY;  Surgeon: Daneil Dolin, MD;  [facility];  Service: Endoscopy;  Laterality: N/A;  1:45 PM   COLONOSCOPY N/A 01/23/2018   Procedure: COLONOSCOPY;  Surgeon: Daneil Dolin, MD;  [facility];  Service: Endoscopy;  Laterality: N/A;  1:00   IR IMAGING GUIDED PORT INSERTION  07/27/2021   IR PARACENTESIS  11/27/2021   NECK SURGERY     Apr 05, 2021- per pt report cervical surgery at day surgery center   POLYPECTOMY  01/23/2018   Procedure: POLYPECTOMY;  Surgeon: Daneil Dolin, MD;  [facility]: [facility];  Service: Endoscopy;;   VIDEO ASSISTED THORACOSCOPY (VATS)/DECORTICATION Right 06/27/2021   Procedure: VIDEO ASSISTED THORACOSCOPY (VATS), Drainage of RIGHT pleural effusion/ DECORTICATION;  Surgeon: Melrose Nakayama, MD;  Location: [place];  Service: Thoracic;  Laterality: Right;   HPI:  51 y.o. 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 The patient was also able to communicate to the ED physician that he had right sided weakness. CT head at [facility] revealed a left insular infarction. CTA revealed left M2 severe stenosis versus occlusion with distal flow.  ST consulted for speech/language evaluation.   Assessment / Plan / Recommendation Clinical Impression  Pt presents with expressive/receptive aphasia as assessed via portions of the [place] Evidence-based language test with a total score not obtained but the following scores  noted with subtests: Verbal expression: 13/19 (68%) with impairment noted with sentence completion, repetition of words and action naming/naming of specific objects and forming sentences given a picture prompt without mod verbal cues including phonemic,semantic, sentence completion and/or articulatory cues provided by SLP.  Relating to auditory comprehension, pt able to read words accurately, but when a directive was given for reading and completing task read (HY:HOOIL to your nose), pt was unable to perform the task.  Writing min assessed with pt able to write name with dominant hand (affected), words written with partial legibility and/or incorrectly formed.  Auditory comprehension tasks overall with a score of 20/22 with difficulty following verbal commands when directives were longer (past 1 step commands).  Complex tasks not assessed, but functional tasks such as identifying call button when asked if assistance was needed accurate within session.  Overall processing impacted as pt would hesitate or prolong implementing directions when asked during assessment.  OME revealed slight R facial weakness at rest.  Speech min dysarthric within simple conversation, but pt's articulation appeared adequate for words expressed.  Wife reports no cognitive deficits at baseline, but cognition should continue to be monitored as able during ST while primary focus of tx being expressive/receptive aphasia while in acute setting.    SLP Assessment  SLP Recommendation/Assessment: Patient needs continued Speech Language Pathology Services SLP Visit Diagnosis: Aphasia (R47.01);Dysarthria and anarthria (R47.1)    Recommendations for follow up therapy are one component of a multi-disciplinary discharge planning process, led by the attending physician.  Recommendations may be updated based on patient status, additional functional criteria and insurance authorization.    Follow Up Recommendations  Acute inpatient rehab  (3hours/day)    Assistance Recommended at Discharge  Frequent or constant Supervision/Assistance  Functional Status Assessment Patient has had a recent decline in their functional status and demonstrates the ability to make significant improvements in function in a reasonable and predictable amount of time.  Frequency and Duration min 2x/week  1 week      SLP Evaluation Cognition  Overall Cognitive Status: Impaired/Different from baseline Arousal/Alertness: Awake/alert Orientation Level: Oriented to person;Oriented to place;Oriented to time;Disoriented to situation;Other (comment) (difficult to assess d/t expressive aphasia) Year: 2023 Month: September Day of Week: Other (Comment) (DNA) Attention: Sustained Sustained Attention:  (DTA d/t level of aphasia impacting performance) Behaviors: Perseveration;Verbal agitation Comments: Aphasia impacts overall performance for cognitive tasks       Comprehension  Auditory Comprehension Overall Auditory Comprehension: Impaired Yes/No Questions: Within Functional Limits Commands: Impaired Two Step Basic Commands: 25-49% accurate Conversation: Simple Interfering Components: Processing speed;Other (comment) (Aphasia) EffectiveTechniques: Extra processing time;Repetition Visual Recognition/Discrimination Discrimination: Exceptions to [facility] Reading Comprehension Reading Status: Impaired Word level: Within functional limits Sentence Level: Impaired Paragraph Level: Not tested Functional Environmental (signs, name badge): Not tested Effective Techniques: Large print;Verbal cueing    Expression Expression Primary Mode of Expression: Verbal Verbal Expression Overall Verbal Expression: Impaired Level of Generative/Spontaneous Verbalization: Phrase Repetition: Impaired Level of Impairment: Word level Naming: Impairment Responsive: 51-75% accurate Confrontation: Impaired Convergent: Not tested Divergent: Not tested Verbal Errors:  Perseveration;Phonemic paraphasias;Not aware of errors Pragmatics: Unable to assess Effective Techniques: Sentence completion;Phonemic cues;Articulatory cues;Semantic cues Non-Verbal Means of Communication: Not applicable Written Expression Dominant Hand: Right Written Expression: Exceptions to [facility] Copy  Ability: Word   Oral / Motor  Oral Motor/Sensory Function Overall Oral Motor/Sensory Function: Other (comment) (slight R facial weakness at rest) Motor Speech Overall Motor Speech: Other (comment) (DTA) Respiration: Within functional limits Phonation: Normal Resonance: Within functional limits Articulation: Within functional limitis Intelligibility: Unable to assess (comment) Motor Planning: Not tested Motor Speech Errors: Not applicable            Elvina Sidle, M.S., [place] 12/20/2021, 12:29 PM
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
## 2021-12-21 ENCOUNTER — Inpatient Hospital Stay (HOSPITAL_COMMUNITY): Payer: Commercial Managed Care - PPO

## 2021-12-21 DIAGNOSIS — Z7189 Other specified counseling: Secondary | ICD-10-CM

## 2021-12-21 DIAGNOSIS — D72829 Elevated white blood cell count, unspecified: Secondary | ICD-10-CM

## 2021-12-21 DIAGNOSIS — Z515 Encounter for palliative care: Secondary | ICD-10-CM

## 2021-12-21 DIAGNOSIS — D649 Anemia, unspecified: Secondary | ICD-10-CM

## 2021-12-21 LAB — TYPE AND SCREEN
ABO/RH(D): O POS
Antibody Screen: NEGATIVE
Unit division: 0

## 2021-12-21 LAB — BASIC METABOLIC PANEL
Anion gap: 8 (ref 5–15)
BUN: 27 mg/dL — ABNORMAL HIGH (ref 6–20)
CO2: 25 mmol/L (ref 22–32)
Calcium: 8.5 mg/dL — ABNORMAL LOW (ref 8.9–10.3)
Chloride: 104 mmol/L (ref 98–111)
Creatinine, Ser: 1.29 mg/dL — ABNORMAL HIGH (ref 0.61–1.24)
GFR, Estimated: >60 mL/min (ref >60)
Glucose, Bld: 80 mg/dL (ref 70–99)
Potassium: 4 mmol/L (ref 3.5–5.1)
Sodium: 137 mmol/L (ref 135–145)

## 2021-12-21 LAB — CBC
HCT: 27.6 % — ABNORMAL LOW (ref 39.0–52.0)
Hemoglobin: 9 g/dL — ABNORMAL LOW (ref 13.0–17.0)
MCH: 31.3 pg (ref 26.0–34.0)
MCHC: 32.6 g/dL (ref 30.0–36.0)
MCV: 95.8 fL (ref 80.0–100.0)
Platelets: 59 10*3/uL — ABNORMAL LOW (ref 150–400)
RBC: 2.88 MIL/uL — ABNORMAL LOW (ref 4.22–5.81)
RDW: 19.5 % — ABNORMAL HIGH (ref 11.5–15.5)
WBC: 14.1 10*3/uL — ABNORMAL HIGH (ref 4.0–10.5)
nRBC: 0.6 % — ABNORMAL HIGH (ref 0.0–0.2)

## 2021-12-21 LAB — ECHOCARDIOGRAM COMPLETE
Area-P 1/2: 4.21 cm2
Calc EF: 50.9 %
Height: 72 in
P 1/2 time: 318 msec
S' Lateral: 3.1 cm
Single Plane A2C EF: 40.2 %
Single Plane A4C EF: 63.2 %
Weight: 2576 oz

## 2021-12-21 LAB — BPAM RBC
Blood Product Expiration Date: 202310052359
ISSUE DATE / TIME: 202309071124
Unit Type and Rh: 5100

## 2021-12-21 MED ORDER — IOHEXOL 300 MG/ML  SOLN
50.0000 mL | Freq: Once | INTRAMUSCULAR | Status: AC | PRN
  Administered 2021-12-21: 50 mL via INTRAVENOUS

## 2021-12-21 MED ORDER — HEPARIN SOD (PORK) LOCK FLUSH 100 UNIT/ML IV SOLN
500.0000 [IU] | INTRAVENOUS | Status: AC | PRN
  Administered 2021-12-21: 500 [IU]

## 2021-12-21 NOTE — Progress Notes (Signed)
SLP Cancellation Note  Patient Details Name: [name] MRN: [id number] DOB: 1971/03/08   Cancelled treatment:       Reason Eval/Treat Not Completed: Patient at procedure or test/unavailable Gabriel Rainwater MA, CCC-SLP   Vena Bassinger [place] 12/21/2021, 9:19 AM

## 2021-12-21 NOTE — Consult Note (Signed)
Palliative Medicine Inpatient Consult Note  Consulting Provider: Charlynne Cousins, MD  Reason for consult:   [name] Potts Palliative Medicine Consult  Reason for Consult? end of life   12/21/2021  HPI:  Per intake H&P --> [name] Potts is an 50 y.o. 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   Patient seen by Palliative care in July and August of this year.  Clinical Assessment/Goals of Care:  *Please note that this is a verbal dictation therefore any spelling or grammatical errors are due to the "[place]" system interpretation.  I have reviewed medical records including EPIC notes, labs and imaging, received report from bedside RN, assessed the patient.    I met with [name] Potts to further discuss diagnosis prognosis, GOC, EOL wishes, disposition and options.   I introduced Palliative Medicine as specialized medical care for people living with serious illness. It focuses on providing relief from the symptoms and stress of a serious illness. The goal is to improve quality of life for both the patient and the family.  Medical History Review and Understanding:  I reviewed [name] Potts history of stage IV noncell small cancer, Chronic pain, constipation, prior pneumonia's and DVT.   Social History:  Patient is from [place], [place] originally. He is married. He has one daughter who is 50 years old. He worked as an Clinical biochemist. He is a man who enjoys his family. He is faithful and practices within Christianity.   Functional and Nutritional State:  Prior to admission. [name] Potts was able to perform all bADLs.   Advance Directives:  A detailed discussion  was had today regarding advanced directives.  Patients surrogate decision maker is his spouse, [name] Potts.  Code Status:  [name] Potts is an established DNAR/DNI code status. This was re-confirmed.   Discussion:  We reviewed [name] Potts's present medical state in the setting of his recent stroke.  Discussed that he was being considered for an outpatient trial drug for his lung cancer though in the setting of this new clot would no longer be a candidate.  Unfortunately per discussion with [name] Potts there are no additional options for treatment.  We reviewed that per conversation with the outpatient palliative care team would also spoken to oncology the best course from here on is that of hospice care. I described hospice as a service for patients who have a life expectancy of 6 months or less. The goal of hospice is the preservation of dignity and quality at the end phases of life. Under hospice care, the focus changes from curative to symptom relief.  In terms of overall prognosis [name] Potts shares that his estimated patients will have week weeks to months to live as per discussion with oncology specialist.  Reviewed the philosophy associated with hospice and the differences between home hospice and inpatient hospice.  At this point in time patient's wife would like for [name] Potts to go home to live out his final journey on earth.  Discussed the importance of continued conversation with family and their  medical providers regarding overall plan of care and treatment options, ensuring decisions are within the context of the patients values and GOCs.  Decision Maker: [name] Potts (Spouse): [phone] ([place])  SUMMARY OF RECOMMENDATIONS   DNAR/DNI  Discussed  patient's present prognosis in the setting of his non-small cell lung cancer and new acute stroke  Patient will be discharged on hospice per primary medical team  Patient's family is interested in hospice of [facility] which the transitions of  care team is aware of  The palliative care service will continue to follow peripherally  Code Status/Advance Care Planning: DNAR/DNI  Palliative Prophylaxis:  Aspiration, Bowel Regimen, Delirium Protocol, Frequent Pain Assessment, Oral Care, Palliative Wound Care, and Turn Reposition  Additional Recommendations (Limitations, Scope, Preferences): Continue current care  Psycho-social/Spiritual:  Desire for further Chaplaincy support: Yes Additional Recommendations: Education on hospice and end-of-life care   Prognosis: Weeks to months  Discharge Planning: Discharge home with hospice once DME are delivered  Vitals:   12/20/21 2316 12/21/21 0420  BP: 119/68 136/72  Pulse: 83   Resp: 18 18  Temp: 98 F (36.7 C) 98.6 F (37 C)  SpO2: 95% 95%    Intake/Output Summary (Last 24 hours) at 12/21/2021 0636 Last data filed at 12/20/2021 1611 Gross per 24 hour  Intake 642 ml  Output --  Net 642 ml   Last Weight  Most recent update: 12/19/2021 11:12 AM    Weight  73 kg (161 lb)            Gen: Middle-aged Caucasian male in no acute distress HEENT: moist mucous membranes CV: Regular rate and rhythm  PULM: On room air, clear ABD: soft/nontender  EXT: No edema  Neuro: Alert and oriented x2-3 intermittently confused and aphasic  PPS: 50%   This conversation/these recommendations were discussed with patient primary care team, Dr. [name]  Total Time: 33  Billing based on MDM: High  Problems Addressed: One acute or chronic illness or injury that poses a threat to life or bodily function  Amount and/or Complexity of Data: Category 3:Discussion of management or test interpretation with external physician/other qualified health care professional/appropriate source (not separately reported)  Risks: Decision regarding hospitalization or escalation of hospital care and Decision not to resuscitate or to de-escalate care because of poor  prognosis ______________________________________________________ [place] Team Team Cell Phone: [phone] Please utilize secure chat with additional questions, if there is no response within 30 minutes please call the above phone number  Palliative Medicine Team providers are available by phone from 7am to 7pm daily and can be reached through the team cell phone.  Should this patient require assistance outside of these hours, please call the patient's attending physician.

## 2021-12-21 NOTE — TOC Transition Note (Signed)
Transition of Care [facility]) - CM/SW Discharge Note   Patient Details  Name: [name] Potts MRN: [id number] Date of Birth: 02/04/1971  [facility] CM/SW Contact:  Pollie Friar, RN Phone Number: 12/21/2021, 11:45 AM   Clinical Narrative:    CM received consult from palliative care that pt and family have decided to d/c home with hospice services. They prefer to use [facility]. CM has called the referral in to Tammy with [place].  CM has called and cancelled his outpatient rehab appointments with [facility].  Wife requesting walker for home. Walker/ bedside commode ordered through [employer] and will be delivered to the room. CM has asked [facility] to order a tub seat.  Pt has transport home when discharged.   Final next level of care: Home w Hospice Care Barriers to Discharge: No Barriers Identified   Patient Goals and CMS Choice   CMS Medicare.gov Compare Post Acute Care list provided to:: Patient Choice offered to / list presented to : Patient, Spouse  Discharge Placement                       Discharge Plan and Services   Discharge Planning Services: CM Consult            DME Arranged: 3-N-1, Walker rolling DME Agency: [employer] Date DME Agency Contacted: 12/21/21   Representative spoke with at DME Agency: [place] ([place]) Interventions     Readmission Risk Interventions    11/02/2021    9:48 AM  Readmission Risk Prevention Plan  Transportation Screening Complete  PCP or Specialist Appt within 3-5 Days Complete  HRI or [place] Complete  Social Work Consult for [place] Planning/Counseling Complete  Palliative Care Screening Complete  Medication Review Press photographer) Complete

## 2021-12-21 NOTE — Progress Notes (Signed)
PT Cancellation Note  Patient Details Name: TAFT WORTHING MRN: [id number] DOB: 1970/04/30   Cancelled Treatment:    Reason Eval/Treat Not Completed: Other (comment); noted plans for d/c and transition to Hospice.  Will sign off.    Reginia Naas 12/21/2021, 1:57 PM Magda Kiel, PT Acute Rehabilitation Services Office:[phone] 12/21/2021

## 2021-12-21 NOTE — Discharge Summary (Signed)
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 peritoneal fluid. Procedure performed by: Corrin Parker, PA-C Electronically Signed   By: Marliss Coots M.D.   On: 11/28/2021 09:00   (Echo, Carotid, EGD, Colonoscopy, ERCP)    Subjective: No complaints  Discharge Exam: Vitals:   12/21/21 0733 12/21/21 1144  BP: 135/71 120/66  Pulse: 90 95  Resp: 16 16  Temp: 97.9 F (36.6 C) 98.4 F (36.9 C)  SpO2: 95% 97%   Vitals:   12/20/21 2316 12/21/21 0420 12/21/21 0733 12/21/21 1144  BP: 119/68 136/72 135/71 120/66  Pulse: 83  90 95  Resp: $Remo'18 18 16 16  'MqOVc$ Temp: 98 F (36.7 C) 98.6 F (37 C) 97.9 F (36.6 C) 98.4 F (36.9 C)  TempSrc:  Oral Oral Oral  SpO2: 95% 95% 95% 97%  Weight:      Height:        General: Pt is alert, awake, not in acute distress Cardiovascular: RRR, S1/S2 +, no rubs, no gallops Respiratory: CTA bilaterally, no wheezing, no rhonchi Abdominal: Soft, NT, ND, bowel sounds + Extremities: no edema, no cyanosis    The results of significant diagnostics from this hospitalization (including imaging, microbiology, ancillary and laboratory) are listed below for reference.      Microbiology: No results found for this or any previous visit (from the past 240 hour(s)).   Labs: BNP (last 3 results) No results for input(s): "BNP" in the last 8760 hours. Basic Metabolic Panel: Recent Labs  Lab 12/19/21 1207 12/20/21 0614 12/21/21 0354  NA 137 137 137  K 3.9 3.6 4.0  CL 103 101 104  CO2 $Re'23 27 25  'pzv$ GLUCOSE 113* 81 80  BUN 25* 27* 27*  CREATININE 1.38* 1.41* 1.29*  CALCIUM 8.6* 8.8* 8.5*  MG  --  2.2  --    Liver Function Tests: Recent Labs  Lab 12/19/21 1207 12/20/21 0614  AST 36 32  ALT 24 20  ALKPHOS 143* 124  BILITOT 0.5 0.3  PROT 6.4* 4.8*  ALBUMIN 2.8* 2.2*   No results for input(s): "LIPASE", "AMYLASE" in the last 168 hours. No results for input(s): "AMMONIA" in the last 168 hours. CBC: Recent Labs  Lab 12/19/21 1207 12/20/21 0614 12/21/21 0354  WBC 17.6* 13.9* 14.1*  NEUTROABS 15.4*  --   --   HGB 8.3* 6.9* 9.0*  HCT 27.4* 22.0* 27.6*  MCV 102.2* 99.5 95.8  PLT 89* 69* 59*   Cardiac Enzymes: No results for input(s): "CKTOTAL", "CKMB", "CKMBINDEX", "TROPONINI" in the last 168 hours. BNP: Invalid input(s): "POCBNP" CBG: No results for input(s): "GLUCAP" in the last 168 hours. D-Dimer No results for input(s): "DDIMER" in the last 72 hours. Hgb A1c Recent Labs    12/20/21 0614  HGBA1C 4.5*   Lipid Profile Recent Labs    12/20/21 0614  CHOL 230*  HDL 48  LDLCALC 147*  TRIG 176*  CHOLHDL 4.8   Thyroid function studies No results for input(s): "TSH", "T4TOTAL", "T3FREE", "THYROIDAB" in the last 72 hours.  Invalid input(s): "FREET3" Anemia work up No results for input(s): "VITAMINB12", "FOLATE", "FERRITIN", "TIBC", "IRON", "RETICCTPCT" in the last 72 hours. Urinalysis    Component Value Date/Time   COLORURINE AMBER (A) 12/19/2021 1209   APPEARANCEUR HAZY (A) 12/19/2021 1209   LABSPEC 1.020 12/19/2021 1209   PHURINE 5.0 12/19/2021 1209   GLUCOSEU NEGATIVE 12/19/2021 1209   HGBUR NEGATIVE 12/19/2021 1209    BILIRUBINUR NEGATIVE 12/19/2021 1209   KETONESUR NEGATIVE 12/19/2021 1209   PROTEINUR NEGATIVE 12/19/2021 1209   NITRITE NEGATIVE 12/19/2021 1209   LEUKOCYTESUR NEGATIVE 12/19/2021 1209   Sepsis Labs Recent Labs  Lab 12/19/21 1207 12/20/21 0614 12/21/21 0354  WBC 17.6* 13.9* 14.1*   Microbiology No results found for this or any previous visit (from the past 240 hour(s)).    SIGNED:   Charlynne Cousins, MD  [facility] 12/21/2021, 12:15 PM Pager   If 7PM-7AM, please contact night-coverage [email or web address] Password [email or web address]
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
## 2021-12-22 ENCOUNTER — Other Ambulatory Visit: Payer: Self-pay | Admitting: Internal Medicine

## 2021-12-25 ENCOUNTER — Telehealth: Payer: Self-pay

## 2021-12-25 NOTE — Telephone Encounter (Signed)
Pt wife called and left a VM to schedule a paracentesis, pt is under [facility]. RN returned call to wife, per wife paracentesis scheduled with hospice. No further questions or concerns at this time.

## 2021-12-26 ENCOUNTER — Inpatient Hospital Stay: Payer: Commercial Managed Care - PPO | Admitting: Nurse Practitioner

## 2021-12-26 ENCOUNTER — Other Ambulatory Visit: Payer: Commercial Managed Care - PPO

## 2021-12-26 ENCOUNTER — Ambulatory Visit: Payer: Commercial Managed Care - PPO | Admitting: Physician Assistant

## 2021-12-26 ENCOUNTER — Ambulatory Visit: Payer: Commercial Managed Care - PPO

## 2021-12-26 ENCOUNTER — Other Ambulatory Visit: Payer: Self-pay | Admitting: Nurse Practitioner

## 2021-12-26 ENCOUNTER — Ambulatory Visit (HOSPITAL_COMMUNITY): Admission: RE | Admit: 2021-12-26 | Payer: Commercial Managed Care - PPO | Source: Ambulatory Visit

## 2021-12-26 ENCOUNTER — Encounter (HOSPITAL_COMMUNITY): Payer: Self-pay

## 2021-12-26 ENCOUNTER — Ambulatory Visit (HOSPITAL_COMMUNITY)
Admission: RE | Admit: 2021-12-26 | Discharge: 2021-12-26 | Disposition: A | Discharge status: Home / Self Care | Payer: Commercial Managed Care - PPO | Source: Ambulatory Visit | Attending: Nurse Practitioner | Admitting: Nurse Practitioner

## 2021-12-26 DIAGNOSIS — R180 Malignant ascites: Secondary | ICD-10-CM | POA: Diagnosis not present

## 2021-12-26 DIAGNOSIS — C3491 Malignant neoplasm of unspecified part of right bronchus or lung: Secondary | ICD-10-CM | POA: Diagnosis present

## 2021-12-26 LAB — CULTURE, BLOOD (ROUTINE X 2)
Culture: NO GROWTH
Culture: NO GROWTH
Special Requests: ADEQUATE
Special Requests: ADEQUATE

## 2021-12-26 NOTE — Progress Notes (Signed)
Pt tolerated left sided paracentesis procedure well today and 1.3 Liters of dark red fluid removed. Pt verbalized understanding of discharge instructions and left via wheelchair with wife with no acute distress noted.

## 2021-12-26 NOTE — Procedures (Signed)
PreOperative Dx: Malignant ascites Postoperative Dx: Malignant ascites Procedure:   US guided paracentesis Radiologist:  Thornton Papas Anesthesia:  10 ml of1% lidocaine Specimen:  1.3 L of dark old bloody ascitic fluid EBL:   < 1 ml Complications: None

## 2021-12-27 ENCOUNTER — Ambulatory Visit: Payer: Commercial Managed Care - PPO

## 2021-12-27 ENCOUNTER — Other Ambulatory Visit: Payer: Commercial Managed Care - PPO

## 2021-12-27 ENCOUNTER — Ambulatory Visit: Payer: Commercial Managed Care - PPO | Admitting: Physician Assistant

## 2021-12-28 ENCOUNTER — Ambulatory Visit: Payer: Commercial Managed Care - PPO

## 2022-01-13 DEATH — deceased

## 2023-07-20 IMAGING — CR DG CHEST 2V
2 series · 2 of 2 positions shown · non-contrast
Comparison: July 17, 2021

CLINICAL DATA: Worsening fatigue post first chemo treatment for
lung cancer.

EXAM:
CHEST - 2 VIEW

[w chest pa]
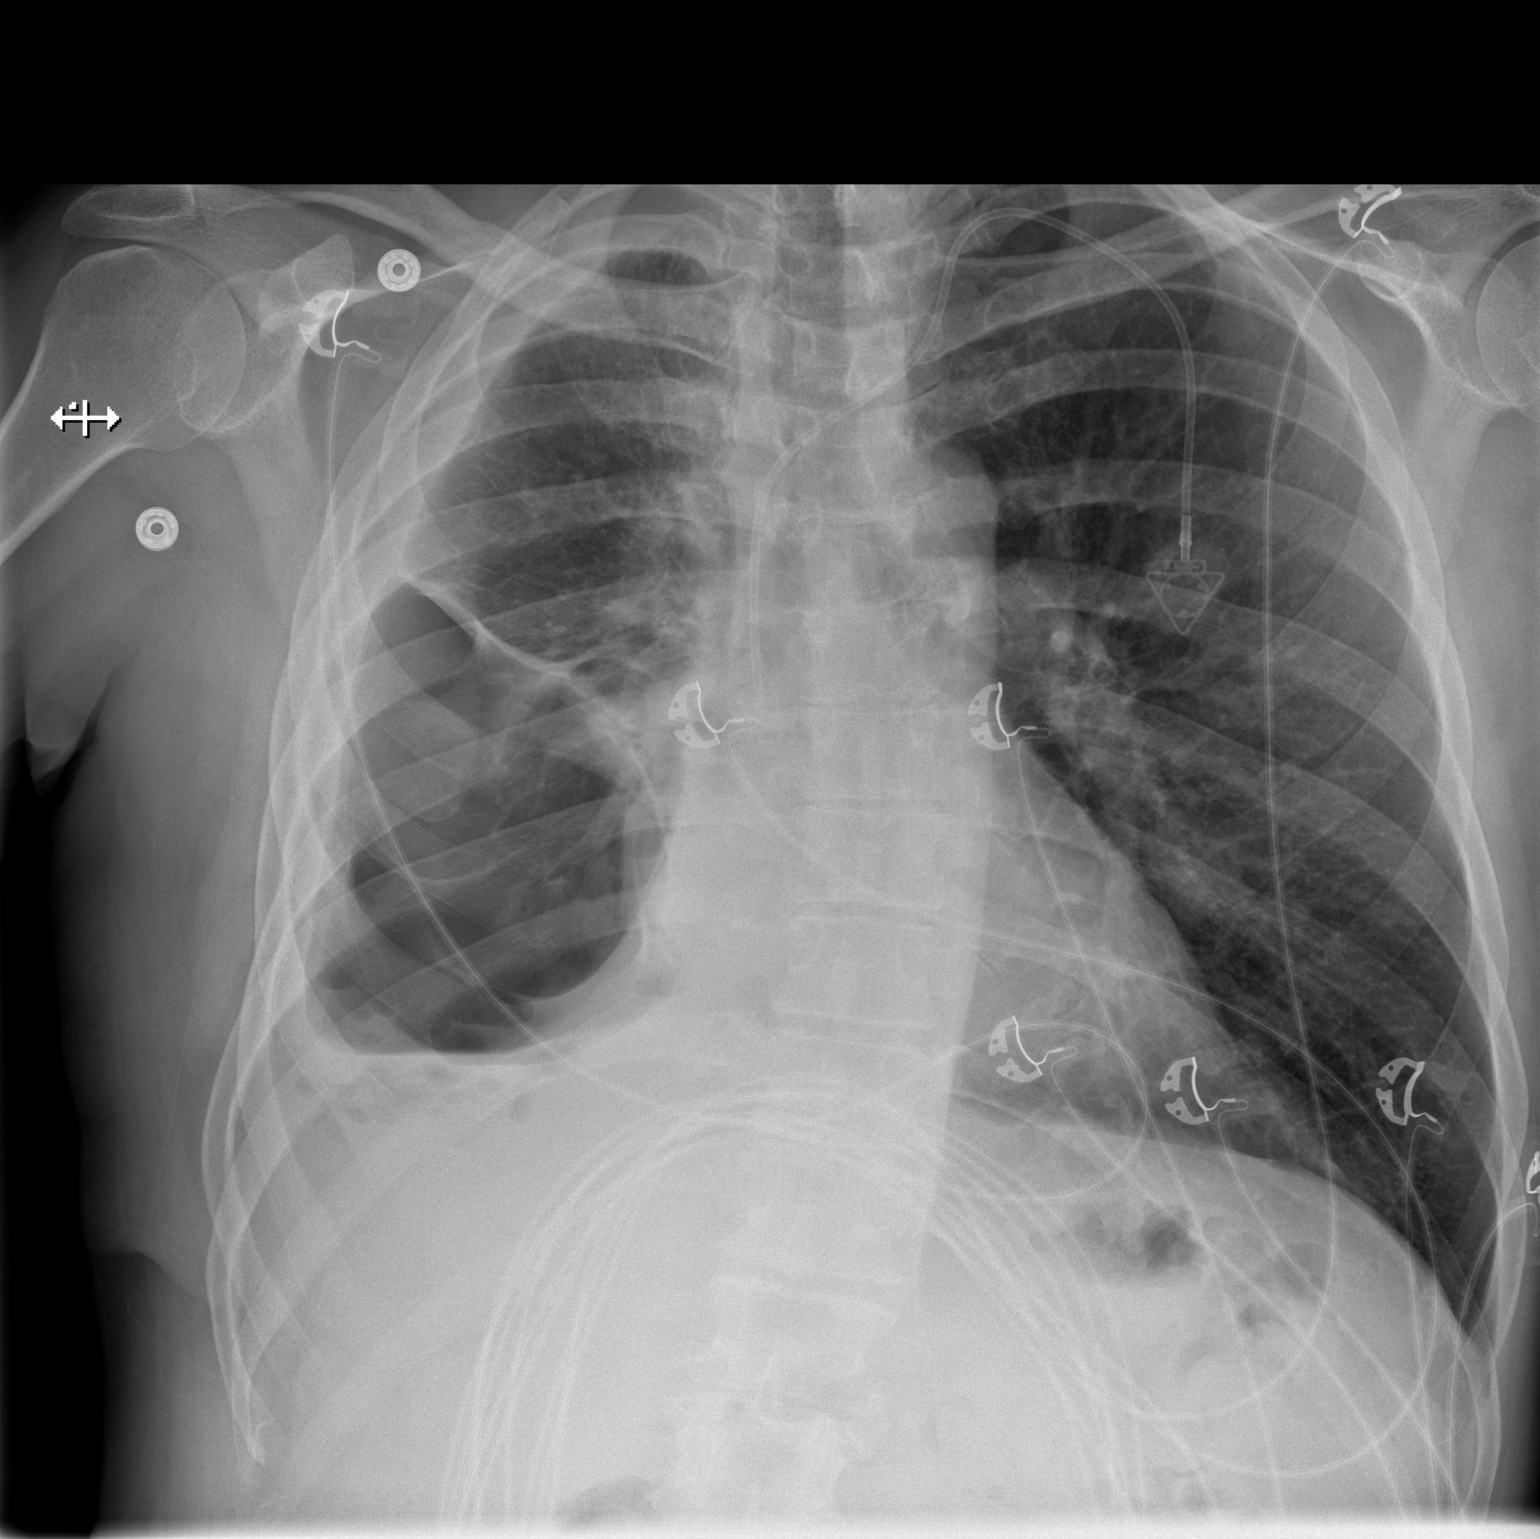

[w chest lat]
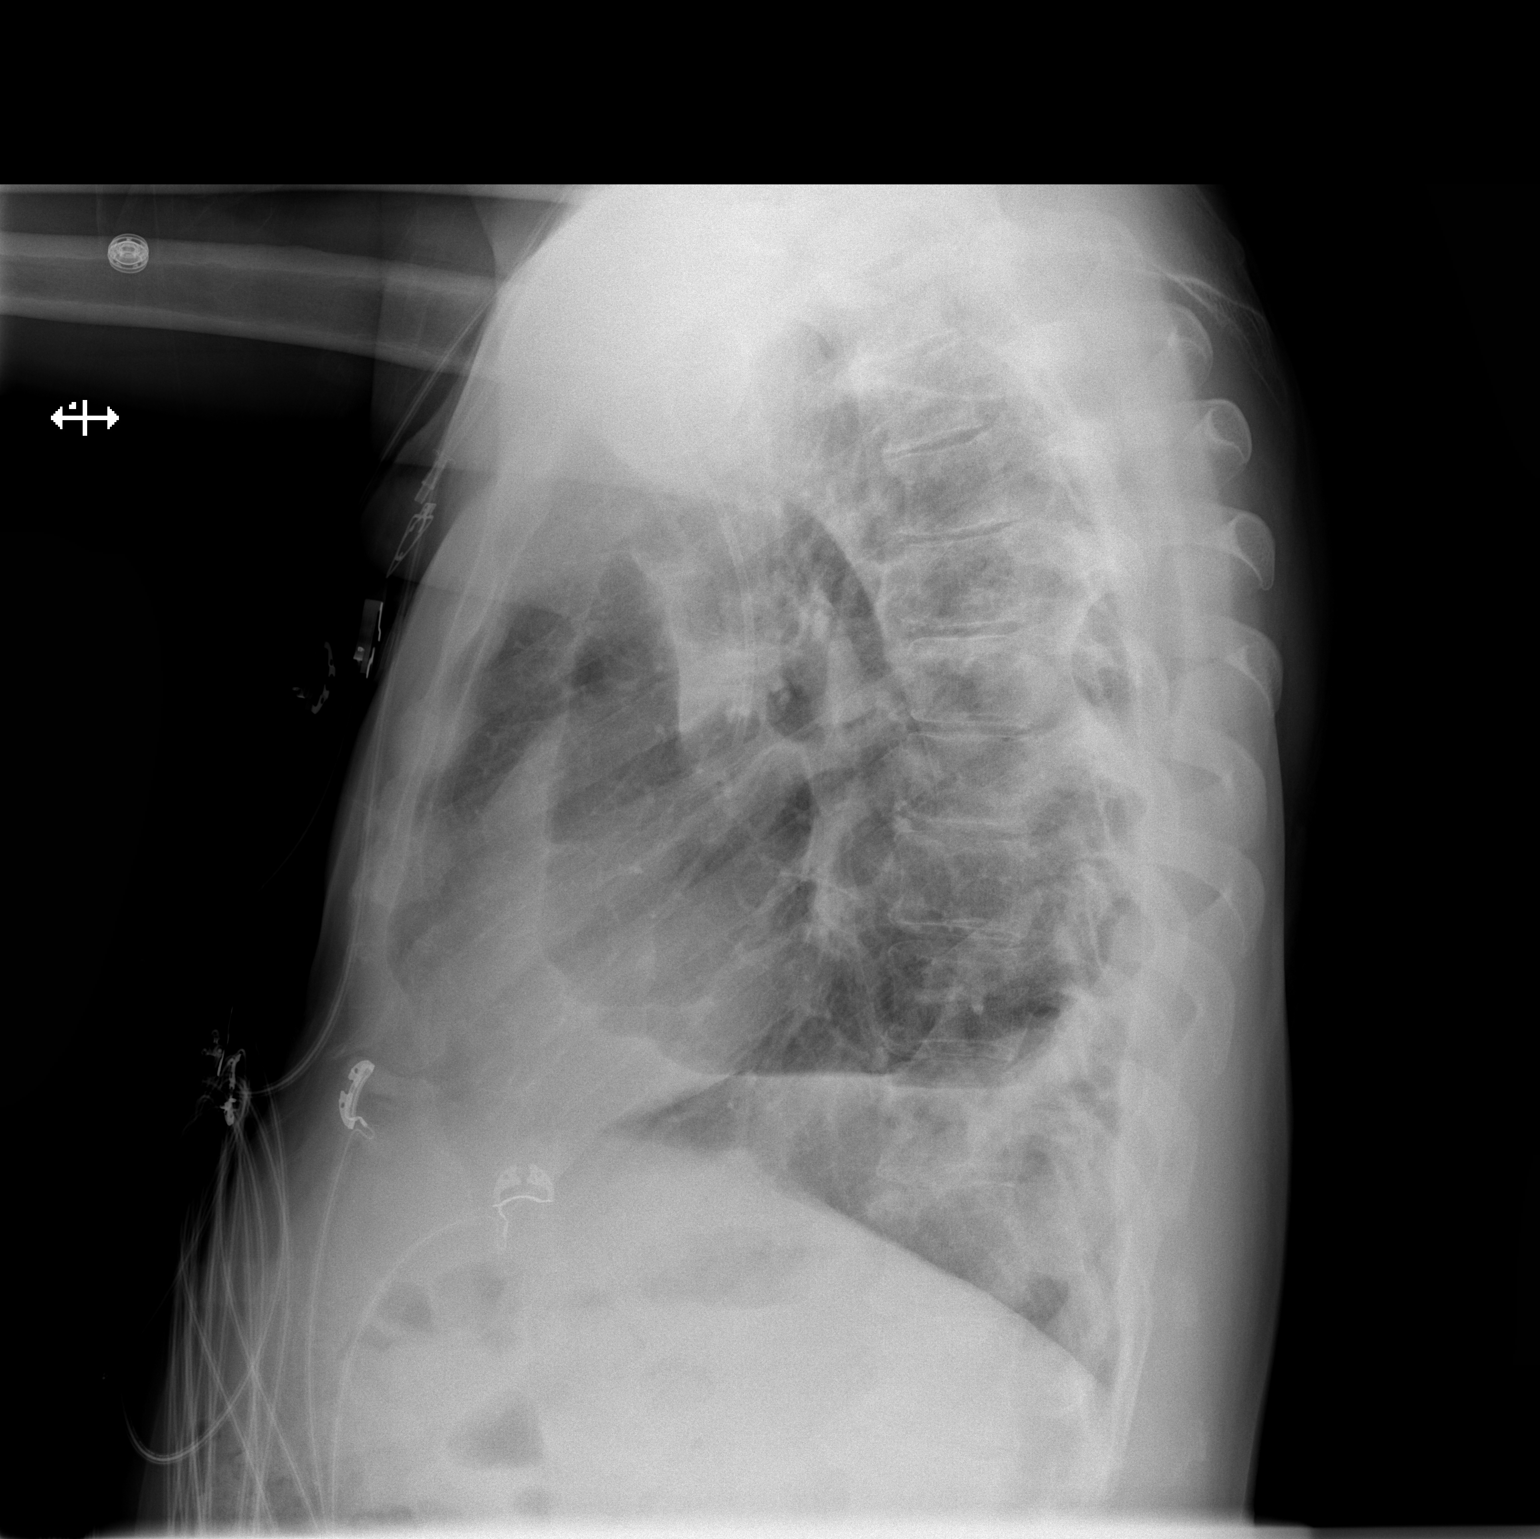

[2 of 2 positions shown; findings below may reference images not displayed]

FINDINGS: Check tubal port terminates in the distal superior vena cava.

Cardiomediastinal silhouette is normal. Mediastinal contours appear
intact.

Persistent right hydropneumothorax.  The left lung is clear.

Osseous structures are without acute abnormality. Soft tissues are
grossly normal.
IMPRESSION: Persistent right hydropneumothorax, similar in size when compared to
July 17, 2021.

## 2023-07-21 IMAGING — DX DG ABDOMEN 2V
3 series · 3 of 3 positions shown · non-contrast
Comparison: No direct comparison study available.

CLINICAL DATA: Nausea, vomiting worsening on [REDACTED]/[REDACTED], pain
on upper right and left sides

EXAM:
ABDOMEN - 2 VIEW

[abdomen erect]
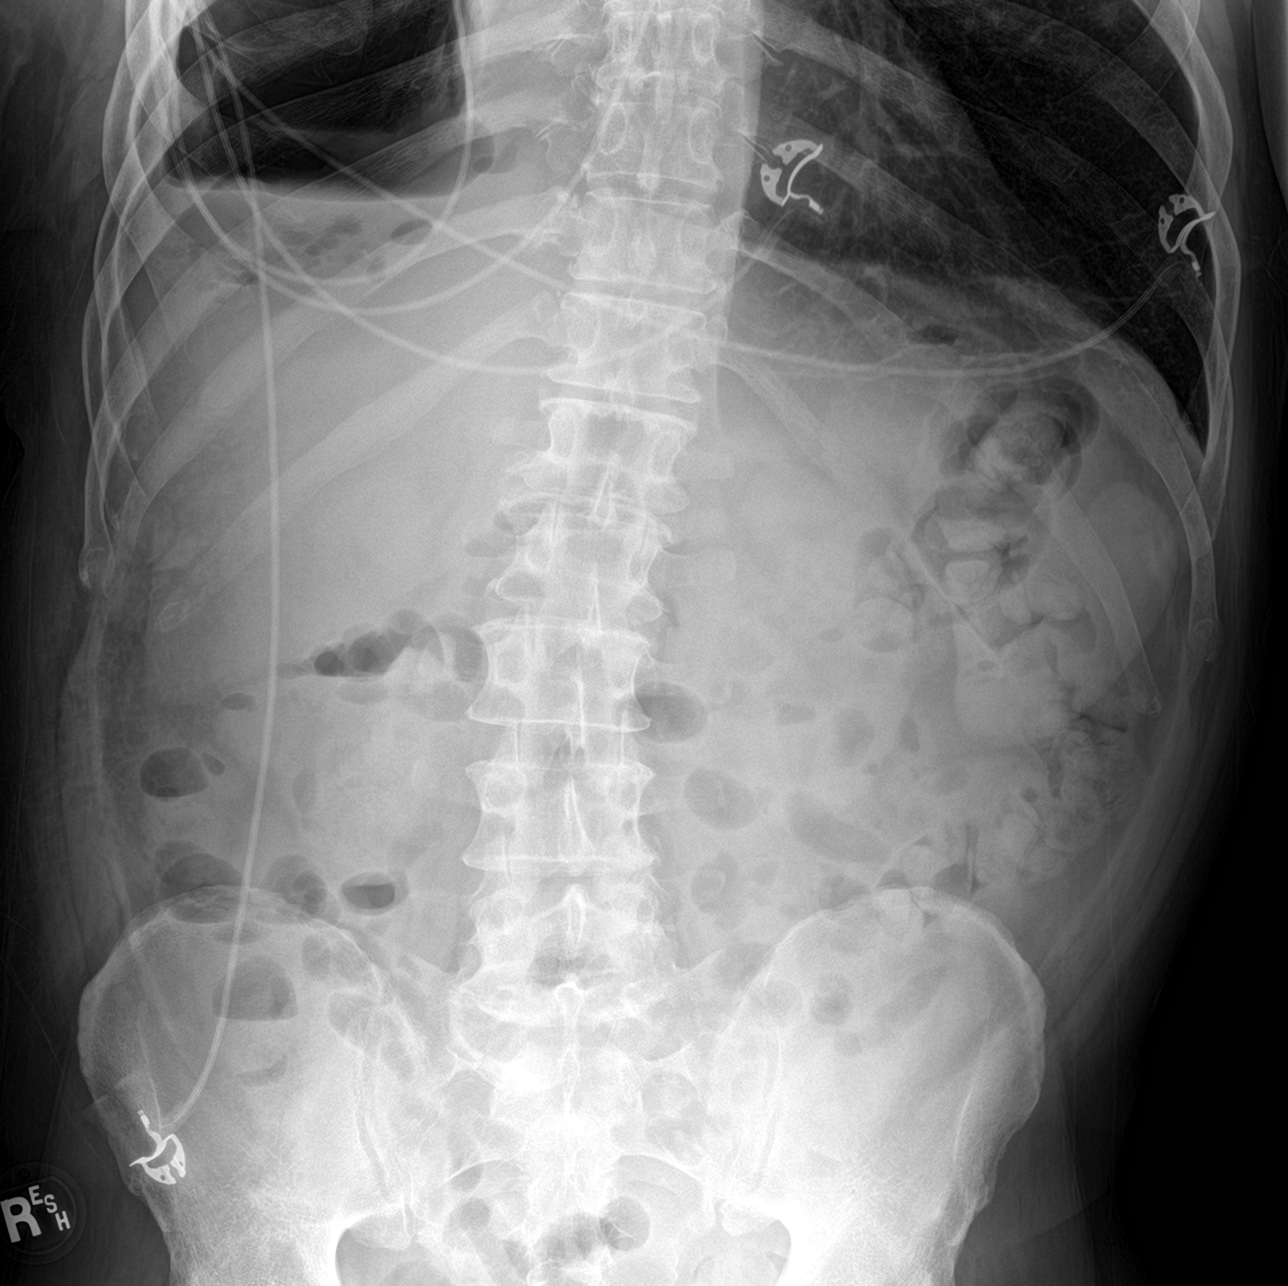

[abdomen supine (1 of 2)]
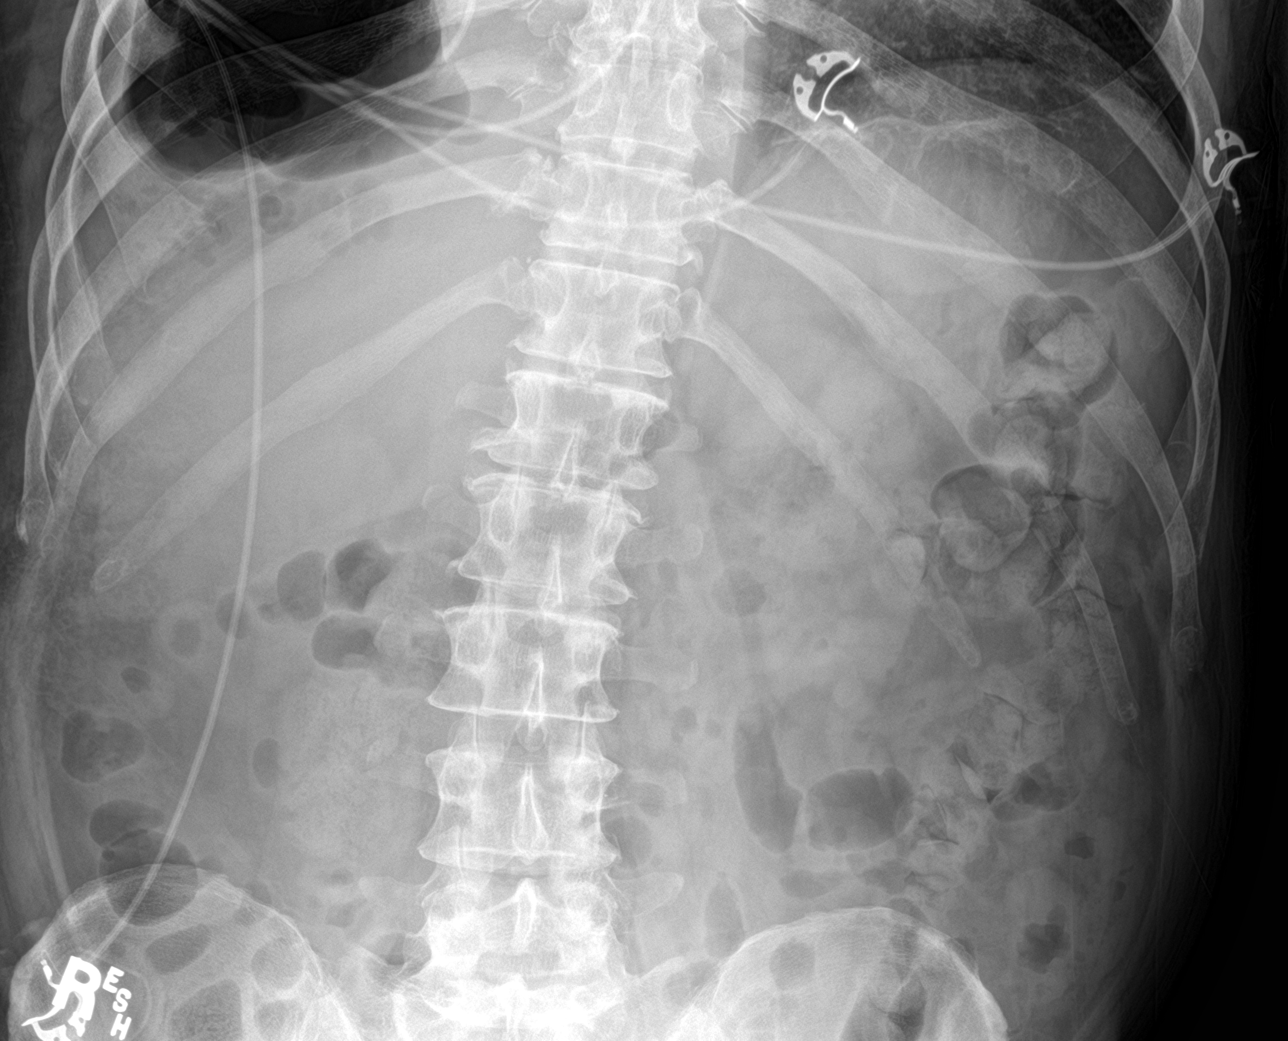

[abdomen supine (2 of 2)]
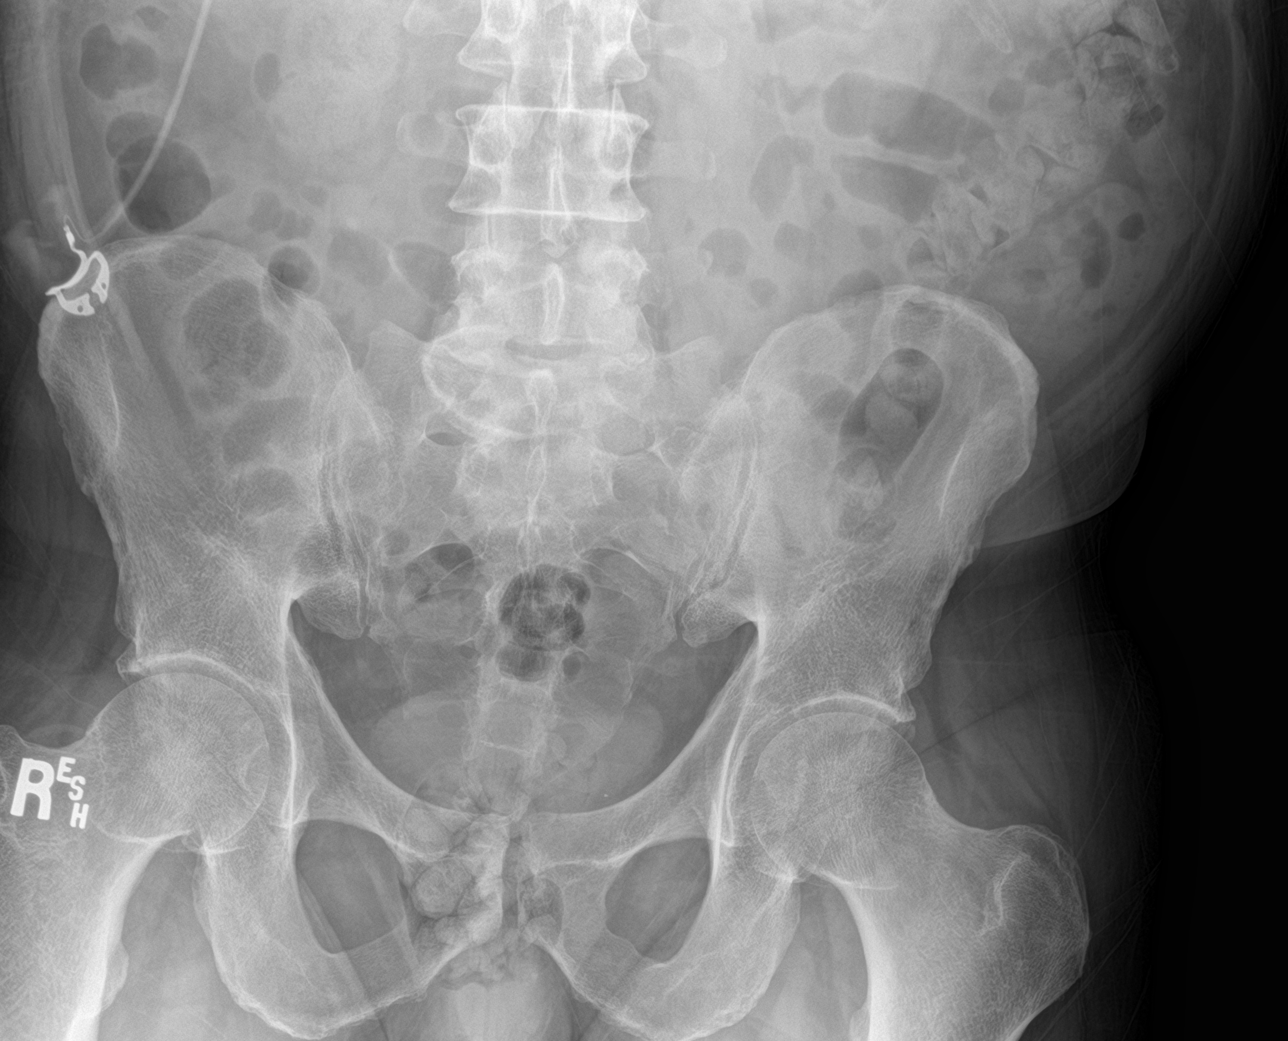

[3 of 3 positions shown; findings below may reference images not displayed]

FINDINGS: There is a nonobstructive bowel gas pattern. There is no gross
organomegaly or abnormal soft tissue calcification. There is no free
intraperitoneal air

A loculated right hydropneumothorax is partially imaged, grossly
similar to the chest radiograph from 1 day prior.
IMPRESSION: 1. Nonobstructive bowel gas pattern. No acute findings in the
abdomen.
2. Loculated right hydropneumothorax, incompletely imaged but
similar to the chest radiograph from 1 day prior.
